# Patient Record
Sex: Female | Born: 1945 | Race: White | Hispanic: No | Smoking: Never smoker
Health system: Southern US, Community
[De-identification: ages and names within clinical notes are randomized; demographics above are authoritative.]

## PROBLEM LIST (undated history)

## (undated) DIAGNOSIS — L509 Urticaria, unspecified: Secondary | ICD-10-CM

## (undated) DIAGNOSIS — L80 Vitiligo: Secondary | ICD-10-CM

## (undated) DIAGNOSIS — T7840XA Allergy, unspecified, initial encounter: Secondary | ICD-10-CM

## (undated) DIAGNOSIS — E079 Disorder of thyroid, unspecified: Secondary | ICD-10-CM

## (undated) HISTORY — DX: Disorder of thyroid, unspecified: E07.9

## (undated) HISTORY — DX: Allergy, unspecified, initial encounter: T78.40XA

## (undated) HISTORY — DX: Urticaria, unspecified: L50.9

## (undated) HISTORY — PX: CATARACT EXTRACTION, BILATERAL: SHX1313

## (undated) HISTORY — DX: Vitiligo: L80

## (undated) HISTORY — PX: SINOSCOPY: SHX187

## (undated) HISTORY — PX: CHOLECYSTECTOMY: SHX55

## (undated) HISTORY — PX: OTHER SURGICAL HISTORY: SHX169

## (undated) HISTORY — PX: APPENDECTOMY: SHX54

---

## 1998-05-19 ENCOUNTER — Other Ambulatory Visit: Admission: RE | Admit: 1998-05-19 | Discharge: 1998-05-19 | Payer: Self-pay | Admitting: *Deleted

## 1999-07-19 ENCOUNTER — Other Ambulatory Visit: Admission: RE | Admit: 1999-07-19 | Discharge: 1999-07-19 | Payer: Self-pay | Admitting: Obstetrics and Gynecology

## 1999-09-08 ENCOUNTER — Encounter: Payer: Self-pay | Admitting: Obstetrics and Gynecology

## 1999-09-08 ENCOUNTER — Encounter: Admission: RE | Admit: 1999-09-08 | Discharge: 1999-09-08 | Payer: Self-pay | Admitting: Obstetrics and Gynecology

## 2000-12-25 ENCOUNTER — Other Ambulatory Visit: Admission: RE | Admit: 2000-12-25 | Discharge: 2000-12-25 | Payer: Self-pay | Admitting: Obstetrics and Gynecology

## 2001-01-10 ENCOUNTER — Encounter: Payer: Self-pay | Admitting: Internal Medicine

## 2001-01-10 ENCOUNTER — Encounter: Admission: RE | Admit: 2001-01-10 | Discharge: 2001-01-10 | Payer: Self-pay | Admitting: Internal Medicine

## 2001-01-21 ENCOUNTER — Encounter (INDEPENDENT_AMBULATORY_CARE_PROVIDER_SITE_OTHER): Payer: Self-pay | Admitting: Specialist

## 2001-01-21 ENCOUNTER — Ambulatory Visit (HOSPITAL_COMMUNITY): Admission: RE | Admit: 2001-01-21 | Discharge: 2001-01-21 | Payer: Self-pay | Admitting: Obstetrics and Gynecology

## 2001-01-21 ENCOUNTER — Encounter: Payer: Self-pay | Admitting: Anesthesiology

## 2002-03-06 ENCOUNTER — Encounter: Payer: Self-pay | Admitting: Family Medicine

## 2002-03-06 ENCOUNTER — Encounter: Admission: RE | Admit: 2002-03-06 | Discharge: 2002-03-06 | Payer: Self-pay | Admitting: Family Medicine

## 2002-03-30 ENCOUNTER — Other Ambulatory Visit: Admission: RE | Admit: 2002-03-30 | Discharge: 2002-03-30 | Payer: Self-pay | Admitting: Family Medicine

## 2003-06-24 ENCOUNTER — Encounter: Admission: RE | Admit: 2003-06-24 | Discharge: 2003-06-24 | Payer: Self-pay | Admitting: Internal Medicine

## 2005-02-06 ENCOUNTER — Encounter: Admission: RE | Admit: 2005-02-06 | Discharge: 2005-02-06 | Payer: Self-pay | Admitting: Family Medicine

## 2005-03-16 ENCOUNTER — Other Ambulatory Visit: Admission: RE | Admit: 2005-03-16 | Discharge: 2005-03-16 | Payer: Self-pay | Admitting: Family Medicine

## 2005-08-31 LAB — HM COLONOSCOPY

## 2006-02-08 ENCOUNTER — Ambulatory Visit: Payer: Self-pay | Admitting: Family Medicine

## 2006-03-12 ENCOUNTER — Encounter: Admission: RE | Admit: 2006-03-12 | Discharge: 2006-03-12 | Payer: Self-pay | Admitting: Family Medicine

## 2006-04-19 ENCOUNTER — Ambulatory Visit: Payer: Self-pay | Admitting: Family Medicine

## 2006-12-30 ENCOUNTER — Ambulatory Visit: Payer: Self-pay | Admitting: Family Medicine

## 2007-06-30 ENCOUNTER — Ambulatory Visit: Payer: Self-pay | Admitting: Family Medicine

## 2007-07-19 ENCOUNTER — Emergency Department (HOSPITAL_COMMUNITY): Admission: EM | Admit: 2007-07-19 | Discharge: 2007-07-19 | Payer: Self-pay | Admitting: Emergency Medicine

## 2008-04-23 ENCOUNTER — Encounter: Admission: RE | Admit: 2008-04-23 | Discharge: 2008-04-23 | Payer: Self-pay | Admitting: Family Medicine

## 2008-04-23 LAB — HM DEXA SCAN

## 2008-08-16 ENCOUNTER — Emergency Department (HOSPITAL_COMMUNITY): Admission: EM | Admit: 2008-08-16 | Discharge: 2008-08-16 | Payer: Self-pay | Admitting: Emergency Medicine

## 2008-08-16 ENCOUNTER — Emergency Department (HOSPITAL_COMMUNITY): Admission: EM | Admit: 2008-08-16 | Discharge: 2008-08-17 | Payer: Self-pay | Admitting: Emergency Medicine

## 2008-11-05 ENCOUNTER — Encounter: Payer: Self-pay | Admitting: Family Medicine

## 2008-11-05 ENCOUNTER — Ambulatory Visit: Payer: Self-pay | Admitting: Family Medicine

## 2008-11-05 ENCOUNTER — Other Ambulatory Visit: Admission: RE | Admit: 2008-11-05 | Discharge: 2008-11-05 | Payer: Self-pay | Admitting: Family Medicine

## 2009-07-29 ENCOUNTER — Encounter: Admission: RE | Admit: 2009-07-29 | Discharge: 2009-07-29 | Payer: Self-pay | Admitting: Family Medicine

## 2010-01-27 ENCOUNTER — Ambulatory Visit: Payer: Self-pay | Admitting: Family Medicine

## 2010-07-31 ENCOUNTER — Encounter
Admission: RE | Admit: 2010-07-31 | Discharge: 2010-07-31 | Payer: Self-pay | Source: Home / Self Care | Attending: Family Medicine | Admitting: Family Medicine

## 2010-12-01 NOTE — Op Note (Signed)
[facility]  Patient:    [name], [name]                      MRN: [id number] Proc. Date: 01/21/01 Adm. Date:  [id number] Attending:  Amanda Cockayne                           Operative Report  PREOPERATIVE DIAGNOSIS:       Irregular and heavy bleeding, fibroid uterus, perimenopausal, endometrial polyp.  POSTOPERATIVE DIAGNOSIS:  OPERATION:                    Hysteroscopy, resection of fibroid and polyp and dilatation.  SURGEON:                      Esmeralda Arthur, M.D.  ASSISTANT:  ANESTHESIA:                   General.  ESTIMATED BLOOD LOSS:         30 cc.  MEDIUM:                       Sorbitol, deficit 80 cc.  FINDINGS:                     Polyps, fibroid, and uterus sounded to 8 cm.  DESCRIPTION OF PROCEDURE:     The patient was carried to the operating room and after satisfactory general anesthesia, she was placed in the lithotomy position.  She was prepped and draped in a sterile field.  The bladder was emptied by catheterization.  The cervix was then dilated to a #25 and observation scope was placed in.  We really could not see very well.  She had no large polyp.  We then dilated her to #35 and inserted the resectoscope and as we could see much better, she had a small polyp which was resected and she had a couple of small fibroids which were resected.  The endometrial lining did not look as thick as it had on ultrasound.  We cauterized all bleeders and then slowly decreased the pressure and we then withdrew the scope and she had no excess bleeding.  We watched her for two minutes.  The procedure was terminated and she was carried to the recovery room in good condition. DD:  01/21/01 TD:  01/21/01 Job: [id number]

## 2010-12-01 NOTE — H&P (Signed)
[facility]  PatientQUIERRA, [name]                        MRN: [id number] Attending:  Esmeralda Arthur, M.D.                         History and Physical                                This is a 65 year old female para 3-0-3 who is admitted to the hospital for [facility], hysteroscopy, and resection of endometrial polyp.  Patient states her last menstrual period was approximately five months ago and she thought she was done.  She had a period June 28 which was very heavy and she had some irregular bleeding also on ______.  Patient had an examination which was basically felt the uterus to be a little enlarged, thought she might have fibroids.  Her ultrasound on January 08, 2001 showed the uterus 8 x 2 x 5.5 x 6.8 cm with an endometrial stripe of 19 mm with a fibroid on the right.  The fibroid measured 3.9 x 3.5 x 3.8.  Does not seem to encroach on the endometrial cavity.  The right ovary is not seen and left ovary is not seen.  She had a sonohysterogram, injected 20 cc of saline in the uterine cavity.  She had a thick anterior wall 5.1 mm and a thick posterior wall 9.3 with a polyp which measured 9 x 6 mm.  The patient is admitted for hysteroscopy and resection.  REVIEW OF SYSTEMS:            GENERAL:  No problem with weight loss or weight gain.  HEENT:  Eyes are negative.  She has some sinus problems for which she takes medication.  CARDIOVASCULAR:  Negative.  RESPIRATORY:  Negative. GASTROINTESTINAL:  Negative.  GENITOURINARY:  Negative.  MUSCULOSKELETAL: Negative.  ENDOCRINE:  She has a thyroid problem and she takes Levoxyl 100 mcg.  She also takes Actifed p.r.n.  PAST MEDICAL HISTORY:         Pneumonia, high blood pressure not currently treated, depression.  FAMILY HISTORY:               No history of diabetes, stroke, high blood pressure.  No history of breast, colon, or ovarian cancer.  History of osteoporosis in the family.  She has had a bone  density in June 2002 which showed a bone mineral ______ osteopenia.  PHYSICAL EXAMINATION  GENERAL:                      Well-developed, well-nourished female oriented and alert.  VITAL SIGNS:                  Blood pressure 113/71, weight 144.75 pounds, pulse 71, height 5 feet 2.75 inches.  HEART:                        Normal size, rhythm.  LUNGS:                        Clear to percussion and auscultation.  BREASTS:                      Tight, full without masses.  ABDOMEN:                      Liver is not enlarged.  Spleen is not enlarged.  PELVIC:                       Vagina:  She has estrogen effect.  Cervix is epithelialized.  Uterus is enlarged, thought to be fibroids.  Ovaries: Negative.  Adnexa:  Negative.  Perineum is within normal limits.  RECTAL:                       Confirms.  IMPRESSION:                   Perimenopausal, heavy and irregular periods, fibroid uterus, endometrial polyp.  Mother has osteoporosis.  Admit for surgery.  The surgery was discussed with the patient and husband and bleeding, clotting, possible perforation.  She understands the risks. DD:  01/20/01 TD:  01/20/01 Job: [id number]

## 2011-02-10 ENCOUNTER — Other Ambulatory Visit: Payer: Self-pay | Admitting: Family Medicine

## 2011-02-12 NOTE — Telephone Encounter (Signed)
Set her up for a medication check appointment. It's been a year. The lateral however thyroid medicine

## 2011-02-12 NOTE — Telephone Encounter (Signed)
Is this ok?

## 2011-03-23 ENCOUNTER — Other Ambulatory Visit: Payer: Self-pay | Admitting: Family Medicine

## 2011-04-12 ENCOUNTER — Encounter: Payer: Self-pay | Admitting: Family Medicine

## 2011-04-24 ENCOUNTER — Telehealth: Payer: Self-pay | Admitting: Family Medicine

## 2011-04-24 MED ORDER — LEVOTHYROXINE SODIUM 100 MCG PO TABS: 100.0000 ug | ORAL_TABLET | Freq: Every day | ORAL | Status: DC

## 2011-04-24 NOTE — Telephone Encounter (Signed)
Renew her thyroid medicine but check on when her last level was drawn and if it's been around a year have her come in

## 2011-04-24 NOTE — Telephone Encounter (Signed)
Sent med in no more refills till med check

## 2011-05-26 ENCOUNTER — Other Ambulatory Visit: Payer: Self-pay | Admitting: Family Medicine

## 2011-05-28 NOTE — Telephone Encounter (Signed)
Pt is coming in for thyoid check for to receive meds for thyroid. Will refill then

## 2011-05-29 ENCOUNTER — Encounter: Payer: Self-pay | Admitting: Family Medicine

## 2011-05-29 ENCOUNTER — Ambulatory Visit (INDEPENDENT_AMBULATORY_CARE_PROVIDER_SITE_OTHER): Payer: BC Managed Care – PPO | Admitting: Family Medicine

## 2011-05-29 VITALS — BP 140/88 | HR 76 | Wt 138.0 lb

## 2011-05-29 DIAGNOSIS — M8580 Other specified disorders of bone density and structure, unspecified site: Secondary | ICD-10-CM | POA: Insufficient documentation

## 2011-05-29 DIAGNOSIS — Z6379 Other stressful life events affecting family and household: Secondary | ICD-10-CM

## 2011-05-29 DIAGNOSIS — E039 Hypothyroidism, unspecified: Secondary | ICD-10-CM | POA: Insufficient documentation

## 2011-05-29 DIAGNOSIS — Z638 Other specified problems related to primary support group: Secondary | ICD-10-CM

## 2011-05-29 DIAGNOSIS — J301 Allergic rhinitis due to pollen: Secondary | ICD-10-CM | POA: Insufficient documentation

## 2011-05-29 DIAGNOSIS — Z23 Encounter for immunization: Secondary | ICD-10-CM

## 2011-05-29 DIAGNOSIS — Z79899 Other long term (current) drug therapy: Secondary | ICD-10-CM

## 2011-05-29 DIAGNOSIS — M899 Disorder of bone, unspecified: Secondary | ICD-10-CM

## 2011-05-29 LAB — COMPREHENSIVE METABOLIC PANEL
ALT: 13 U/L (ref 0–35)
AST: 22 U/L (ref 0–37)
Albumin: 4.5 g/dL (ref 3.5–5.2)
BUN: 16 mg/dL (ref 6–23)
CO2: 30 mEq/L (ref 19–32)
Chloride: 102 mEq/L (ref 96–112)
Potassium: 4.1 mEq/L (ref 3.5–5.3)
Sodium: 138 mEq/L (ref 135–145)
Total Bilirubin: 0.5 mg/dL (ref 0.3–1.2)

## 2011-05-29 LAB — CBC WITH DIFFERENTIAL/PLATELET
Basophils Relative: 1 % (ref 0–1)
Eosinophils Absolute: 0.5 10*3/uL (ref 0.0–0.7)
Hemoglobin: 13.7 g/dL (ref 12.0–15.0)
Lymphs Abs: 1.6 10*3/uL (ref 0.7–4.0)
MCH: 30.9 pg (ref 26.0–34.0)
Neutro Abs: 3.5 10*3/uL (ref 1.7–7.7)
RBC: 4.44 MIL/uL (ref 3.87–5.11)
RDW: 13 % (ref 11.5–15.5)
WBC: 6.2 10*3/uL (ref 4.0–10.5)

## 2011-05-29 LAB — LIPID PANEL
Total CHOL/HDL Ratio: 3 Ratio
VLDL: 19 mg/dL (ref 0–40)

## 2011-05-29 NOTE — Patient Instructions (Signed)
If you have difficulty with functioning or sleep, give me a call

## 2011-05-29 NOTE — Progress Notes (Signed)
  Subjective:    Patient ID: [name], female    DOB: 1945/07/17, 65 y.o.   MRN: [id number]  HPI She is here for medication check. Her main concern today is the fact that her husband had a CVA and has been in a nursing home since May. She is visiting him twice per day. She is quite stressed over this and is having some difficulties with sleep. Seems somewhat overwhelmed by will this to she did retire in July. She would like refills on her thyroid medication.   Review of Systems     Objective:   Physical Exam alert and in no distress. Tympanic membranes and canals are normal. Throat is clear. Tonsils are normal. Neck is supple without adenopathy or thyromegaly. Cardiac exam shows a regular sinus rhythm without murmurs or gallops. Lungs are clear to auscultation. DTRs are 1+.        Assessment & Plan:   1. Hypothyroid  TSH  2. Allergic rhinitis due to pollen    3. Osteopenia  TSH, CBC with Differential, Comprehensive metabolic panel  4. Encounter for long-term (current) use of other medications  TSH, CBC with Differential, Comprehensive metabolic panel, Lipid panel  5. Stressful life event affecting family     louderthen laid over er stressful life situation. Discussed using sleeping meds and anxiety meds. At this point she is not interested in them.

## 2011-05-30 ENCOUNTER — Ambulatory Visit: Payer: Self-pay | Admitting: Family Medicine

## 2011-05-30 MED ORDER — LEVOTHYROXINE SODIUM 100 MCG PO TABS: 100.0000 ug | ORAL_TABLET | Freq: Every day | ORAL | Status: DC

## 2011-05-30 NOTE — Progress Notes (Addendum)
  Subjective:    Patient ID: [name], female    DOB: 06-Oct-1945, 65 y.o.   MRN: [id number]  HPI    Review of Systems     Objective:   Physical Exam        Assessment & Plan:  TSH is normal. I will continue her on her present thyroid dosing.

## 2011-05-30 NOTE — Progress Notes (Signed)
Addended by: Ronnald Nian on: 05/30/2011 08:14 AM   Modules accepted: Orders

## 2011-08-17 ENCOUNTER — Encounter: Payer: Self-pay | Admitting: Medical

## 2011-08-17 ENCOUNTER — Ambulatory Visit (INDEPENDENT_AMBULATORY_CARE_PROVIDER_SITE_OTHER): Payer: BC Managed Care – PPO | Admitting: Medical

## 2011-08-17 VITALS — BP 110/80 | HR 60 | Temp 97.8°F | Resp 16 | Wt 141.0 lb

## 2011-08-17 DIAGNOSIS — J329 Chronic sinusitis, unspecified: Secondary | ICD-10-CM | POA: Insufficient documentation

## 2011-08-17 DIAGNOSIS — J40 Bronchitis, not specified as acute or chronic: Secondary | ICD-10-CM | POA: Insufficient documentation

## 2011-08-17 MED ORDER — PREDNISONE 20 MG PO TABS: ORAL_TABLET | ORAL | Status: DC

## 2011-08-17 MED ORDER — BENZONATATE 100 MG PO CAPS: 100.0000 mg | ORAL_CAPSULE | Freq: Three times a day (TID) | ORAL | Status: AC | PRN

## 2011-08-17 MED ORDER — LEVOFLOXACIN 500 MG PO TABS: 500.0000 mg | ORAL_TABLET | Freq: Every day | ORAL | Status: AC

## 2011-08-17 NOTE — Patient Instructions (Signed)
I am treating you for a respiratory infection involving head and chest.  Begin Levaquin antibiotic, once daily for a week.  Use the Tessalon Perles cough drops 3 times daily as needed.  You can also take Mucinex DM or Coricidin HBP OTC for congestion/cough.  Rest, drink plenty of fluids.    If you feel chest tightness or wheezy, then begin the Prednisone steroid, 3 tables daily for 2 days, then 2 tablets daily for 3 days.

## 2011-08-17 NOTE — Progress Notes (Signed)
Subjective:  [name] is a 66 y.o. female who presents for 1 week history of runny nose, sore throat, nasal and head congestion, intermittent headache, but 3 days ago cough, chest congestion, overall feeling worse. Appetite is down, she has had some chills and rattly chest. She is visiting her husband twice a day in the nursing home, so there may be sick contacts there. Her lungs difficult fight, has a history of bronchitis in the past.  She did get a flu shot this year.  No other aggravating or relieving factors.  No other c/o.  The following portions of the patient's history were reviewed and updated as appropriate: allergies, current medications, past family history, past medical history, past social history, past surgical history and problem list.  Past Medical History  Diagnosis Date  . Thyroid disease     HYPOTHYROID  . Allergy     RHINITIS  . Osteoporosis     OSTEOPENIA    Objective:   Filed Vitals:   08/17/11 1430  BP: 110/80  Pulse: 60  Temp: 97.8 F (36.6 C)  Resp: 16    General appearance: Alert, WD/WN, no distress, ill appearing, nasal sounding voice                             Skin: warm, no rash, no diaphoresis                           Head: mild sinus tenderness                            Eyes: conjunctiva normal, corneas clear, PERRLA                            Ears: pearly TMs, external ear canals normal                          Nose: septum midline, turbinates swollen, with erythema and clear discharge             Mouth/throat: MMM, tongue normal, mild pharyngeal erythema                           Neck: supple, no adenopathy, no thyromegaly, nontender                          Heart: RRR, normal S1, S2, no murmurs                         Lungs: +bronchial breath sounds, +scattered rhonchi, no wheezes, no rales                Extremities: no edema, nontender     Assessment and Plan:   Encounter Diagnosis  Name Primary?  . Sinobronchitis Yes   Given  her symptoms, exam, and nursing home visitation recently, will cover for suspected bacteria.  Prescription given today for Levaquin, Prednisone and Tessalon Perles as below.  Discussed diagnosis and treatment.  Suggested symptomatic OTC remedies for cough and congestion.   Tylenol or Ibuprofen OTC for fever and malaise.  Call/return in 2-3 days if symptoms are worse or not improving.  Advised that cough may linger even after the infection  is improved.

## 2011-08-21 ENCOUNTER — Telehealth: Payer: Self-pay | Admitting: Family Medicine

## 2011-08-21 NOTE — Telephone Encounter (Signed)
PATIENT STATES THAT SHE IS AFRAID OF THE SIDE EFFECTS OF THE MEDICATION SO SHE DECIDED THAT SHE WILL JUST WAIT A FEW DAYS AND SEE IF THINGS GET BETTER. I ADVISED HER THAT SHE WILL NEED TO LET us KNOW SOMETHING ON Friday AS TO WITHER SHE IS FEELING BETTER OR NOT. PATIENT AGREED AND WILL LET us KNOW. CLS

## 2011-08-21 NOTE — Telephone Encounter (Signed)
Its hard to know if this is from the antibiotic versus the prednisone?  Either way, she can stop the Levaquin.   If the head and chest congestion is not improving, we can send out different antibiotic such as Augmentin.  Let me know.

## 2011-09-04 ENCOUNTER — Other Ambulatory Visit: Payer: Self-pay | Admitting: Family Medicine

## 2011-09-04 DIAGNOSIS — Z1231 Encounter for screening mammogram for malignant neoplasm of breast: Secondary | ICD-10-CM

## 2011-09-06 ENCOUNTER — Ambulatory Visit (INDEPENDENT_AMBULATORY_CARE_PROVIDER_SITE_OTHER): Payer: BC Managed Care – PPO | Admitting: Family Medicine

## 2011-09-06 ENCOUNTER — Encounter: Payer: Self-pay | Admitting: Internal Medicine

## 2011-09-06 ENCOUNTER — Encounter: Payer: Self-pay | Admitting: Family Medicine

## 2011-09-06 VITALS — BP 114/70 | HR 74 | Ht 61.5 in | Wt 139.0 lb

## 2011-09-06 DIAGNOSIS — J209 Acute bronchitis, unspecified: Secondary | ICD-10-CM

## 2011-09-06 DIAGNOSIS — J019 Acute sinusitis, unspecified: Secondary | ICD-10-CM

## 2011-09-06 MED ORDER — AMOXICILLIN 875 MG PO TABS: 875.0000 mg | ORAL_TABLET | Freq: Two times a day (BID) | ORAL | Status: AC

## 2011-09-06 NOTE — Progress Notes (Signed)
  Subjective:    Patient ID: [name], female    DOB: Feb 25, 1946, 66 y.o.   MRN: [id number]  HPI Since last being seen she has continued to have some difficulty with malaise and decreased fatigue. Over the last 4 days she is also noted left earache as well as increasing fatigue but no trouble with  sore throat but she has had increased difficulty with congestion. She did have trouble with  The Levaquin and had to stop it due to adverse side effects.   Review of Systems     Objective:   Physical Exam alert and in no distress. Tympanic membranes and canals are normal. Throat is clear. Tonsils are normal. Neck is supple without adenopathy or thyromegaly. Cardiac exam shows a regular sinus rhythm without murmurs or gallops. Lungs are clear to auscultation.        Assessment & Plan:   1. Bronchitis, acute  amoxicillin (AMOXIL) 875 MG tablet  2. Sinusitis, acute  amoxicillin (AMOXIL) 875 MG tablet   I think she had unresolved sinusitis bronchitis from the previous episode and now has recurred.

## 2011-09-06 NOTE — Patient Instructions (Signed)
Take all the antibiotic and have trouble let me know. If you're not totally back to normal when you get done give me a call

## 2011-09-17 ENCOUNTER — Ambulatory Visit
Admission: RE | Admit: 2011-09-17 | Discharge: 2011-09-17 | Disposition: A | Discharge status: Home / Self Care | Payer: BC Managed Care – PPO | Source: Ambulatory Visit | Attending: Family Medicine | Admitting: Family Medicine

## 2011-09-17 DIAGNOSIS — Z1231 Encounter for screening mammogram for malignant neoplasm of breast: Secondary | ICD-10-CM

## 2011-11-28 ENCOUNTER — Telehealth: Payer: Self-pay | Admitting: Internal Medicine

## 2011-11-28 MED ORDER — LEVOTHYROXINE SODIUM 100 MCG PO TABS: 100.0000 ug | ORAL_TABLET | Freq: Every day | ORAL | Status: DC

## 2011-11-28 NOTE — Telephone Encounter (Signed)
Unithroid/levoxyl is unavailable so i switched her to synthroid #30 11 refills

## 2012-01-22 ENCOUNTER — Ambulatory Visit (INDEPENDENT_AMBULATORY_CARE_PROVIDER_SITE_OTHER): Payer: BC Managed Care – PPO | Admitting: Family Medicine

## 2012-01-22 ENCOUNTER — Encounter: Payer: Self-pay | Admitting: Family Medicine

## 2012-01-22 VITALS — BP 118/76 | HR 66 | Wt 141.0 lb

## 2012-01-22 DIAGNOSIS — M461 Sacroiliitis, not elsewhere classified: Secondary | ICD-10-CM

## 2012-01-22 NOTE — Progress Notes (Signed)
  Subjective:    Patient ID: [name], female    DOB: [birth year], 66 y.o.   MRN: [id number]  HPI She has a history of intermittent back pain for approximately one year. It would goal a relatively quickly. Approximately 5 days ago she developed left-sided back pain that radiated down her leg with tingling in her toes. No numbness, tingling or weakness.  Review of Systems     Objective:   Physical Exam Alert and in no distress. Tender to palpation of the left SI joint. Figure 4 and stork test was positive. Negative straight leg raising. Normal DTRs.       Assessment & Plan:   1. Sacroiliitis    instructed her on heat, stretching and a lead. She will call if not entirely better after one or 2 weeks.

## 2012-01-22 NOTE — Patient Instructions (Addendum)
Heat to your back for 20 minutes 3 times per day. When you've done with the heat then I want you to do some stretching. Take 2 Aleve twice a day. Made any improvements in 2 weeks call me and I will refer you to physical therapy

## 2012-01-23 ENCOUNTER — Other Ambulatory Visit: Payer: Self-pay | Admitting: Family Medicine

## 2012-01-23 MED ORDER — TRAMADOL HCL 50 MG PO TABS: ORAL_TABLET | ORAL | Status: DC

## 2012-01-23 NOTE — Telephone Encounter (Signed)
Pt was called and informed of med ordered. Pt states she is not comfortable with that type meds and requested an appt. Pt coming in tomorrow.

## 2012-01-23 NOTE — Telephone Encounter (Signed)
Pt called back and cancelled her appt for tomorrow morning 01/24/12 and stated she will try out the tramadol first

## 2012-01-23 NOTE — Telephone Encounter (Signed)
Pt called and cancelled appointment and said she was going to try medication instead.

## 2012-01-24 ENCOUNTER — Ambulatory Visit: Payer: BC Managed Care – PPO | Admitting: Family Medicine

## 2012-05-12 ENCOUNTER — Ambulatory Visit: Payer: BC Managed Care – PPO | Admitting: Family Medicine

## 2012-05-12 ENCOUNTER — Ambulatory Visit (INDEPENDENT_AMBULATORY_CARE_PROVIDER_SITE_OTHER): Payer: BC Managed Care – PPO | Admitting: Family Medicine

## 2012-05-12 ENCOUNTER — Encounter: Payer: Self-pay | Admitting: Family Medicine

## 2012-05-12 VITALS — BP 112/70 | HR 66

## 2012-05-12 DIAGNOSIS — Z23 Encounter for immunization: Secondary | ICD-10-CM

## 2012-05-12 DIAGNOSIS — F419 Anxiety disorder, unspecified: Secondary | ICD-10-CM

## 2012-05-12 DIAGNOSIS — F341 Dysthymic disorder: Secondary | ICD-10-CM

## 2012-05-12 MED ORDER — CITALOPRAM HYDROBROMIDE 20 MG PO TABS: 20.0000 mg | ORAL_TABLET | Freq: Every day | ORAL | Status: DC

## 2012-05-12 NOTE — Patient Instructions (Signed)
Check at the nursing home and also with the [employer] and hospice for support groups. Do not go out to the nursing home every day

## 2012-05-12 NOTE — Progress Notes (Signed)
  Subjective:    Patient ID: [name], female    DOB: [birth year], 66 y.o.   MRN: [id number]  HPI She is here for consult concerning anxiety and depression. For the last 2 years her husband has been in a nursing home. She has been visiting him twice per day. 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 She is quite anxious over this and having some legal difficulties with the [facility] and through his Medicaid insurance. She has had difficulty with sleep. Several people said it's time for her to get on medications.   Review of Systems     Objective:   Physical Exam Alert and cheerful but appropriately dressed with appropriate affect.       Assessment & Plan:   1. Need for prophylactic vaccination and inoculation against influenza  Flu vaccine greater than or equal to 3yo preservative free IM  2. Anxiety and depression  citalopram (CELEXA) 20 MG tablet   I discussed the need for her to take some time out for herself. I explained that he is now a point where his Alzheimer's disease is interfering with his ability to recommend whether she even is visiting. She is aware of this and I encouraged her to visit every other day. I will also place him on Celexa. Recommend she get involved in a support group. Check here in one month Flu shot given with risks and benefits discussed

## 2012-06-16 ENCOUNTER — Ambulatory Visit: Payer: BC Managed Care – PPO | Admitting: Family Medicine

## 2012-10-23 ENCOUNTER — Emergency Department (HOSPITAL_COMMUNITY)
Admission: EM | Admit: 2012-10-23 | Discharge: 2012-10-23 | Disposition: A | Discharge status: Home / Self Care | Payer: Medicare Other | Source: Home / Self Care | Attending: Emergency Medicine | Admitting: Emergency Medicine

## 2012-10-23 ENCOUNTER — Encounter (HOSPITAL_COMMUNITY): Payer: Self-pay | Admitting: Emergency Medicine

## 2012-10-23 ENCOUNTER — Emergency Department (INDEPENDENT_AMBULATORY_CARE_PROVIDER_SITE_OTHER): Payer: Medicare Other

## 2012-10-23 DIAGNOSIS — S5290XA Unspecified fracture of unspecified forearm, initial encounter for closed fracture: Secondary | ICD-10-CM

## 2012-10-23 DIAGNOSIS — S5291XA Unspecified fracture of right forearm, initial encounter for closed fracture: Secondary | ICD-10-CM

## 2012-10-23 MED ORDER — HYDROCODONE-ACETAMINOPHEN 5-325 MG PO TABS: ORAL_TABLET | ORAL | Status: DC

## 2012-10-23 MED ORDER — HYDROCODONE-ACETAMINOPHEN 5-325 MG PO TABS
ORAL_TABLET | ORAL | Status: AC
  Filled 2012-10-23: qty 1

## 2012-10-23 MED ORDER — HYDROCODONE-ACETAMINOPHEN 5-325 MG PO TABS
1.0000 | ORAL_TABLET | Freq: Once | ORAL | Status: AC
  Administered 2012-10-23: 1 via ORAL

## 2012-10-23 NOTE — Progress Notes (Signed)
Orthopedic Tech Progress Note Patient Details:  [name] 05/11/1946 [id number]  Ortho Devices Type of Ortho Device: Ace wrap;Arm sling;Sugartong splint Ortho Device/Splint Location: (R) UE Ortho Device/Splint Interventions: Ordered;Application   Jennye Moccasin 10/23/2012, 8:27 PM

## 2012-10-23 NOTE — ED Notes (Signed)
Patient states she fell in yard while raking leaves. States numbness and inability to move right hand with pain that radiates to elbow.

## 2012-10-23 NOTE — ED Provider Notes (Signed)
Chief Complaint:   Chief Complaint  Patient presents with  . Wrist Pain    History of Present Illness:   [name] is a 67 year old female who fell this afternoon in her yard on outstretched hand, injuring her right wrist. The wrist shows obvious deformity and it hurts to move it. The pain radiates up towards the elbow and the entire hand feels somewhat numb and tingly.  Review of Systems:  Other than noted above, the patient denies any of the following symptoms: Systemic:  No fevers, chills, sweats, or aches.  No fatigue or tiredness. Musculoskeletal:  No joint pain, arthritis, bursitis, swelling, back pain, or neck pain. Neurological:  No muscular weakness, paresthesias, headache, or trouble with speech or coordination.  No dizziness.  PMFSH:  Past medical history, family history, social history, meds, and allergies were reviewed.    Physical Exam:   Vital signs:  BP 168/70  Pulse 80  Temp(Src) 98.4 F (36.9 C) (Oral)  Resp 16  SpO2 99% Gen:  Alert and oriented times 3.  In no distress. Musculoskeletal: There is an obvious deformity at the wrist. She is unable to move the wrist at all on spray tender to touch. She has tenderness from the elbow on down to the hand. She does perceive light touch in all of her fingers and she has good capillary refill and good radial pulse.  Otherwise, all joints had a full a ROM with no swelling, bruising or deformity.  No edema, pulses full. Extremities were warm and pink.  Capillary refill was brisk.  Skin:  Clear, warm and dry.  No rash. Neuro:  Alert and oriented times 3.  Muscle strength was normal.  Sensation was intact to light touch.   Radiology:  Dg Wrist Complete Right  10/23/2012  *RADIOLOGY REPORT*  Clinical Data: Fall on outstretched hand  RIGHT WRIST - COMPLETE 3+ VIEW  Comparison: None.  Findings: Nondisplaced/impacted distal radial fracture with apex volar angulation.  Minimally displaced ulnar styloid fracture.  Moderate  degenerative changes the first carpometacarpal joint.  Associated soft tissue swelling.  IMPRESSION: Distal radial and ulnar fractures, as described above.   Original Report Authenticated By: Charline Bills, M.D.    I reviewed the images independently and personally and concur with the radiologist's findings.  Course in Urgent Care Center:   She was given Norco 5/325 for pain. A sugar tong splint was placed and she was put in a sling.  Assessment:  The encounter diagnosis was Radial fracture, right, closed, initial encounter.  Plan:   1.  The following meds were prescribed:   Discharge Medication List as of 10/23/2012  8:04 PM    START taking these medications   Details  HYDROcodone-acetaminophen (NORCO/VICODIN) 5-325 MG per tablet 1 to 2 tabs every 4 to 6 hours as needed for pain., Print       2.  The patient was instructed in symptomatic care, including rest and activity, elevation, application of ice and compression.  Appropriate handouts were given. 3.  The patient was told to return if becoming worse in any way, if no better in 3 or 4 days, and given some red flag symptoms such as worsening pain, increase in numbness, or color changes of the hand that would indicate earlier return.   4.  The patient was told to follow up with Dr. Amanda Pea at his office tomorrow. He was called and informed of her injury.    Reuben Likes, MD 10/23/12 2120

## 2012-11-24 ENCOUNTER — Other Ambulatory Visit: Payer: Self-pay | Admitting: Family Medicine

## 2013-01-09 ENCOUNTER — Other Ambulatory Visit: Payer: Self-pay

## 2013-01-09 DIAGNOSIS — Z1231 Encounter for screening mammogram for malignant neoplasm of breast: Secondary | ICD-10-CM

## 2013-01-30 ENCOUNTER — Ambulatory Visit: Payer: Self-pay | Admitting: Family Medicine

## 2013-01-30 ENCOUNTER — Ambulatory Visit: Payer: Medicare Other

## 2013-02-13 ENCOUNTER — Ambulatory Visit
Admission: RE | Admit: 2013-02-13 | Discharge: 2013-02-13 | Disposition: A | Discharge status: Home / Self Care | Payer: Medicare Other | Source: Ambulatory Visit

## 2013-02-13 DIAGNOSIS — Z1231 Encounter for screening mammogram for malignant neoplasm of breast: Secondary | ICD-10-CM

## 2013-04-03 ENCOUNTER — Emergency Department (HOSPITAL_COMMUNITY)
Admission: EM | Admit: 2013-04-03 | Discharge: 2013-04-04 | Disposition: A | Discharge status: Home / Self Care | Payer: Medicare Other | Attending: Emergency Medicine | Admitting: Emergency Medicine

## 2013-04-03 ENCOUNTER — Emergency Department (HOSPITAL_COMMUNITY): Payer: Medicare Other

## 2013-04-03 ENCOUNTER — Encounter (HOSPITAL_COMMUNITY): Payer: Self-pay | Admitting: Emergency Medicine

## 2013-04-03 DIAGNOSIS — S92502A Displaced unspecified fracture of left lesser toe(s), initial encounter for closed fracture: Secondary | ICD-10-CM

## 2013-04-03 DIAGNOSIS — Z79899 Other long term (current) drug therapy: Secondary | ICD-10-CM | POA: Insufficient documentation

## 2013-04-03 DIAGNOSIS — M810 Age-related osteoporosis without current pathological fracture: Secondary | ICD-10-CM | POA: Insufficient documentation

## 2013-04-03 DIAGNOSIS — S92919A Unspecified fracture of unspecified toe(s), initial encounter for closed fracture: Secondary | ICD-10-CM | POA: Insufficient documentation

## 2013-04-03 DIAGNOSIS — Z7982 Long term (current) use of aspirin: Secondary | ICD-10-CM | POA: Insufficient documentation

## 2013-04-03 DIAGNOSIS — Y9389 Activity, other specified: Secondary | ICD-10-CM | POA: Insufficient documentation

## 2013-04-03 DIAGNOSIS — Z8709 Personal history of other diseases of the respiratory system: Secondary | ICD-10-CM | POA: Insufficient documentation

## 2013-04-03 DIAGNOSIS — Y9289 Other specified places as the place of occurrence of the external cause: Secondary | ICD-10-CM | POA: Insufficient documentation

## 2013-04-03 DIAGNOSIS — W2209XA Striking against other stationary object, initial encounter: Secondary | ICD-10-CM | POA: Insufficient documentation

## 2013-04-03 DIAGNOSIS — E039 Hypothyroidism, unspecified: Secondary | ICD-10-CM | POA: Insufficient documentation

## 2013-04-03 NOTE — ED Notes (Signed)
Pt states she struck R foot on door frame @ 2030, 5th toe deformed and discolored.

## 2013-04-03 NOTE — ED Provider Notes (Signed)
CSN: [id number]     Arrival date & time 04/03/13  2209 History   This chart was scribed for non-physician practitioner Emilia Beck, PA-C working with Hurman Horn, MD by Joaquin Music, ED Scribe. This patient was seen in room WTR7/WTR7 and the patient's care was started at 11:19 PM  Chief Complaint  Patient presents with  . Toe Injury   The history is provided by the patient. No language interpreter was used.   HPI Comments: [name] is a 66 y.o. female with history of osteoporosis who presents to the Emergency Department complaining of severe left 5th toe pain onset 3 hours ago. She states she was in the kitchen when she struck and injured her 5th left toe. Pt states pain was sharp and sudden. Pt denies any other associated symptoms.     Past Medical History  Diagnosis Date  . Thyroid disease     HYPOTHYROID  . Allergy     RHINITIS  . Osteoporosis     OSTEOPENIA   Past Surgical History  Procedure Laterality Date  . Appendectomy    . Cholecystectomy    . Arm surgery Right    Family History  Problem Relation Age of Onset  . Stroke Mother   . Arthritis Mother    History  Substance Use Topics  . Smoking status: Never Smoker   . Smokeless tobacco: Not on file  . Alcohol Use: No   OB History   Grav Para Term Preterm Abortions TAB SAB Ect Mult Living                 Review of Systems  All other systems reviewed and are negative.    Allergies  Levofloxacin  Home Medications   Current Outpatient Rx  Name  Route  Sig  Dispense  Refill  . aspirin 81 MG tablet   Oral   Take 81 mg by mouth daily.           . citalopram (CELEXA) 20 MG tablet   Oral   Take 1 tablet (20 mg total) by mouth daily.   30 tablet   3   . HYDROcodone-acetaminophen (NORCO/VICODIN) 5-325 MG per tablet      1 to 2 tabs every 4 to 6 hours as needed for pain.   20 tablet   0   . Multiple Vitamins-Minerals (MULTIVITAMIN WITH MINERALS) tablet   Oral   Take 1  tablet by mouth daily.         Marland Kitchen UNITHROID 100 MCG tablet      TAKE 1 TABLET (100 MCG TOTAL) BY MOUTH DAILY.   30 tablet   11     Dispense as written.    Triage Vitals: BP 138/72  Pulse 72  Temp(Src) 98.1 F (36.7 C) (Oral)  Resp 18  Ht 5\' 1"  (1.549 m)  Wt 140 lb (63.504 kg)  BMI 26.47 kg/m2  SpO2 98%  Physical Exam  Nursing note and vitals reviewed. Constitutional: She is oriented to person, place, and time. She appears well-developed and well-nourished. No distress.  HENT:  Head: Normocephalic and atraumatic.  Eyes: EOM are normal.  Neck: Neck supple. No tracheal deviation present.  Cardiovascular: Normal rate.   Sufficient capillary refill of distal 5 toe.  Pulmonary/Chest: Effort normal. No respiratory distress.  Musculoskeletal: Normal range of motion.  Obvious deformity of left 5 toe with lateral angulation. Tenderness to palpate at the proximal joint with overlying bruising.   Neurological: She is alert and  oriented to person, place, and time.  Sensation intact of left 5 toe.  Skin: Skin is warm and dry.  No open wounds. Bruising noted to left 5 toe.   Psychiatric: She has a normal mood and affect. Her behavior is normal.    ED Course  NERVE BLOCK Date/Time: 04/04/2013 12:02 AM Performed by: Emilia Beck Authorized by: Emilia Beck Consent: Verbal consent obtained. Risks and benefits: risks, benefits and alternatives were discussed Consent given by: patient Patient understanding: patient states understanding of the procedure being performed Patient consent: the patient's understanding of the procedure matches consent given Patient identity confirmed: verbally with patient Indications: fracture Body area: lower extremity Nerve: digital Laterality: left Patient sedated: no Patient position: sitting Needle gauge: 25 G Location technique: anatomical landmarks Local anesthetic: lidocaine 2% without epinephrine Anesthetic total: 3 ml Outcome:  pain unchanged Patient tolerance: Patient tolerated the procedure well with no immediate complications.    DIAGNOSTIC STUDIES: Oxygen Saturation is 98% on RA, normal by my interpretation.    COORDINATION OF CARE: 11:21 PM-Discussed radiology findings of fx of left 5th toe. Will discuss pt case with attending provider, Dr. Fonnie Jarvis and ortho tech. Pt will need to F/U with orthopedist. Pt agrees with treatment.   Labs Review Labs Reviewed - No data to display Imaging Review Dg Toe 5th Left  04/03/2013   CLINICAL DATA:  Bruising and deformity after blunt trauma  EXAM: DG TOE 5TH LEFT  COMPARISON:  02/26/2008  FINDINGS: Oblique fracture across the proximal phalanx of the left little toe, with lateral angulation of the distal fracture fragment. No evidence of extension to the articular surface. No other acute bone abnormality. Mild diffuse osteopenia.  IMPRESSION: Angulated fracture, proximal phalanx left little toe.   Electronically Signed   By: Oley Balm M.D.   On: 04/03/2013 23:02    MDM   1. Fracture of fifth toe, left, closed, initial encounter     12:03 AM Patient given nerve block and attempted to realign toe fracture. Buddy tape applied to toe. Patient will follow up with Orthopedics who she has established care with. No neurovascular compromise. Vitals stable and patient afebrile. Patient instructed to minimal weight bearing. Patient has a post-op shoe with her that she is instructed to wear.   I personally performed the services described in this documentation, which was scribed in my presence. The recorded information has been reviewed and is accurate.    Emilia Beck, [place] 04/04/13 [phone]

## 2013-04-04 NOTE — ED Provider Notes (Signed)
Medical screening examination/treatment/procedure(s) were performed by non-physician practitioner and as supervising physician I was immediately available for consultation/collaboration.  Masao Junker M Zenna Traister, MD 04/04/13 0345 

## 2013-04-29 ENCOUNTER — Encounter: Payer: Self-pay | Admitting: Internal Medicine

## 2013-06-18 ENCOUNTER — Institutional Professional Consult (permissible substitution): Payer: Self-pay | Admitting: Family Medicine

## 2013-11-30 ENCOUNTER — Other Ambulatory Visit: Payer: Self-pay | Admitting: Family Medicine

## 2013-12-01 ENCOUNTER — Other Ambulatory Visit: Payer: Self-pay

## 2013-12-01 MED ORDER — UNITHROID 100 MCG PO TABS: ORAL_TABLET | ORAL | Status: DC

## 2013-12-15 ENCOUNTER — Encounter: Payer: Self-pay | Admitting: Family Medicine

## 2013-12-15 ENCOUNTER — Ambulatory Visit (INDEPENDENT_AMBULATORY_CARE_PROVIDER_SITE_OTHER): Payer: Medicare Other | Admitting: Family Medicine

## 2013-12-15 VITALS — BP 120/70 | HR 68 | Ht 61.0 in | Wt 141.0 lb

## 2013-12-15 DIAGNOSIS — Z23 Encounter for immunization: Secondary | ICD-10-CM

## 2013-12-15 DIAGNOSIS — M899 Disorder of bone, unspecified: Secondary | ICD-10-CM

## 2013-12-15 DIAGNOSIS — E039 Hypothyroidism, unspecified: Secondary | ICD-10-CM

## 2013-12-15 DIAGNOSIS — J301 Allergic rhinitis due to pollen: Secondary | ICD-10-CM

## 2013-12-15 DIAGNOSIS — M949 Disorder of cartilage, unspecified: Secondary | ICD-10-CM

## 2013-12-15 DIAGNOSIS — M8580 Other specified disorders of bone density and structure, unspecified site: Secondary | ICD-10-CM

## 2013-12-15 DIAGNOSIS — Z0000 Encounter for general adult medical examination without abnormal findings: Secondary | ICD-10-CM

## 2013-12-15 LAB — CBC WITH DIFFERENTIAL/PLATELET
BASOS ABS: 0.1 10*3/uL (ref 0.0–0.1)
BASOS PCT: 1 % (ref 0–1)
EOS PCT: 3 % (ref 0–5)
Eosinophils Absolute: 0.2 10*3/uL (ref 0.0–0.7)
HEMATOCRIT: 41.3 % (ref 36.0–46.0)
Hemoglobin: 14.1 g/dL (ref 12.0–15.0)
Lymphocytes Relative: 24 % (ref 12–46)
Lymphs Abs: 1.6 10*3/uL (ref 0.7–4.0)
MCH: 30.6 pg (ref 26.0–34.0)
MCHC: 34.1 g/dL (ref 30.0–36.0)
MCV: 89.6 fL (ref 78.0–100.0)
MONO ABS: 0.5 10*3/uL (ref 0.1–1.0)
Monocytes Relative: 8 % (ref 3–12)
NEUTROS ABS: 4.3 10*3/uL (ref 1.7–7.7)
Neutrophils Relative %: 64 % (ref 43–77)
PLATELETS: 235 10*3/uL (ref 150–400)
RBC: 4.61 MIL/uL (ref 3.87–5.11)
RDW: 13.9 % (ref 11.5–15.5)
WBC: 6.7 10*3/uL (ref 4.0–10.5)

## 2013-12-15 LAB — COMPREHENSIVE METABOLIC PANEL
ALK PHOS: 69 U/L (ref 39–117)
ALT: 18 U/L (ref 0–35)
AST: 26 U/L (ref 0–37)
Albumin: 4.4 g/dL (ref 3.5–5.2)
BILIRUBIN TOTAL: 0.6 mg/dL (ref 0.2–1.2)
BUN: 19 mg/dL (ref 6–23)
CO2: 26 mEq/L (ref 19–32)
CREATININE: 0.72 mg/dL (ref 0.50–1.10)
Calcium: 9.9 mg/dL (ref 8.4–10.5)
Chloride: 99 mEq/L (ref 96–112)
Glucose, Bld: 85 mg/dL (ref 70–99)
Potassium: 4.2 mEq/L (ref 3.5–5.3)
Sodium: 136 mEq/L (ref 135–145)
Total Protein: 7.2 g/dL (ref 6.0–8.3)

## 2013-12-15 NOTE — Progress Notes (Signed)
   Subjective:    Patient ID: [name], female    DOB: July 09, 1946, 68 y.o.   MRN: [id number]  HPI She is here for a complete examination. She does have hypothyroidism and presently is on replacement medication. She also has a history of osteopenia and has not had a DEXA scan done in quite some time. Her allergies are under good control. She is a semiretired. She is now dating someone and this is going quite well. She does keep herself quite busy physically as well as mentally. She has no other concerns or complaints. She does have an advanced directive.  Review of Systems  All other systems reviewed and are negative.      Objective:   Physical Exam BP 120/70  Pulse 68  Ht 5\' 1"  (1.549 m)  Wt 141 lb (63.957 kg)  BMI 26.66 kg/m2  General Appearance:    Alert, cooperative, no distress, appears stated age  Head:    Normocephalic, without obvious abnormality, atraumatic  Eyes:    PERRL, conjunctiva/corneas clear, EOM's intact, fundi    benign  Ears:    Normal TM's and external ear canals  Nose:   Nares normal, mucosa normal, no drainage or sinus   tenderness  Throat:   Lips, mucosa, and tongue normal; teeth and gums normal  Neck:   Supple, no lymphadenopathy;  thyroid:  no   enlargement/tenderness/nodules; no carotid   bruit or JVD  Back:    Spine nontender, no curvature, ROM normal, no CVA     tenderness  Lungs:     Clear to auscultation bilaterally without wheezes, rales or     ronchi; respirations unlabored  Chest Wall:    No tenderness or deformity   Heart:    Regular rate and rhythm, S1 and S2 normal, no murmur, rub   or gallop  Breast Exam:    Deferred to GYN  Abdomen:     Soft, non-tender, nondistended, normoactive bowel sounds,    no masses, no hepatosplenomegaly  Genitalia:    Deferred to GYN     Extremities:   No clubbing, cyanosis or edema  Pulses:   2+ and symmetric all extremities  Skin:   Skin color, texture, turgor normal, no rashes or lesions  Lymph nodes:    Cervical, supraclavicular, and axillary nodes normal  Neurologic:   CNII-XII intact, normal strength, sensation and gait; reflexes 2+ and symmetric throughout          Psych:   Normal mood, affect, hygiene and grooming.          Assessment & Plan:  Routine general medical examination at a health care facility - Plan: CBC with Differential, Comprehensive metabolic panel  Need for prophylactic vaccination against Streptococcus pneumoniae (pneumococcus) - Plan: Pneumococcal conjugate vaccine 13-valent  Hypothyroid - Plan: TSH  Allergic rhinitis due to pollen  Osteopenia - Plan: DG Bone Density  encouraged her to take good care of herself physically as well as mentally. Discussed nightlights which she is doing. Also discussed possible falls. Psychologically she is doing quite well. Followup pending blood work.

## 2013-12-16 ENCOUNTER — Telehealth: Payer: Self-pay | Admitting: Family Medicine

## 2013-12-16 LAB — TSH: TSH: 1.193 u[IU]/mL (ref 0.350–4.500)

## 2013-12-16 MED ORDER — LEVOTHYROXINE SODIUM 100 MCG PO TABS: ORAL_TABLET | ORAL | Status: DC

## 2013-12-16 NOTE — Telephone Encounter (Signed)
Returned called to pt [name] [phone] lm that I returned her call.  She had left message with concerns of her meds and avs

## 2013-12-16 NOTE — Addendum Note (Signed)
Addended by: Denita Lung on: 12/16/2013 08:27 AM   Modules accepted: Orders

## 2013-12-21 ENCOUNTER — Telehealth: Payer: Self-pay | Admitting: Family Medicine

## 2013-12-21 NOTE — Telephone Encounter (Signed)
Please call patient concerning  Bone density and  She also needs to update her medication list

## 2013-12-21 NOTE — Telephone Encounter (Signed)
Pt is scheduled for Bone Density on 12/25/13 at 2:00pm

## 2013-12-25 ENCOUNTER — Ambulatory Visit
Admission: RE | Admit: 2013-12-25 | Discharge: 2013-12-25 | Disposition: A | Discharge status: Home / Self Care | Payer: Medicare Other | Source: Ambulatory Visit | Attending: Family Medicine | Admitting: Family Medicine

## 2013-12-25 LAB — HM DEXA SCAN

## 2013-12-30 ENCOUNTER — Encounter: Payer: Self-pay | Admitting: Internal Medicine

## 2013-12-31 ENCOUNTER — Telehealth: Payer: Self-pay | Admitting: Family Medicine

## 2013-12-31 NOTE — Telephone Encounter (Signed)
Pt.notified

## 2013-12-31 NOTE — Telephone Encounter (Signed)
Let her know that the DEXA showed osteopenia which is thinning of the bones but not osteoporosis. Continue with vitamin D and calcium.

## 2013-12-31 NOTE — Telephone Encounter (Signed)
Pt called awaiting dexa results.  Please advise [phone].  Also sent copy of labs to pt as requested

## 2014-02-24 ENCOUNTER — Other Ambulatory Visit: Payer: Self-pay

## 2014-02-24 DIAGNOSIS — Z1231 Encounter for screening mammogram for malignant neoplasm of breast: Secondary | ICD-10-CM

## 2014-03-03 ENCOUNTER — Ambulatory Visit
Admission: RE | Admit: 2014-03-03 | Discharge: 2014-03-03 | Disposition: A | Discharge status: Home / Self Care | Payer: Medicare Other | Source: Ambulatory Visit

## 2014-03-03 DIAGNOSIS — Z1231 Encounter for screening mammogram for malignant neoplasm of breast: Secondary | ICD-10-CM

## 2014-05-12 ENCOUNTER — Other Ambulatory Visit (INDEPENDENT_AMBULATORY_CARE_PROVIDER_SITE_OTHER): Payer: Medicare Other

## 2014-05-12 DIAGNOSIS — Z23 Encounter for immunization: Secondary | ICD-10-CM

## 2014-12-28 ENCOUNTER — Other Ambulatory Visit: Payer: Self-pay | Admitting: Family Medicine

## 2015-03-28 ENCOUNTER — Other Ambulatory Visit: Payer: Self-pay | Admitting: Family Medicine

## 2015-04-07 ENCOUNTER — Ambulatory Visit (INDEPENDENT_AMBULATORY_CARE_PROVIDER_SITE_OTHER): Payer: Medicare Other | Admitting: Family Medicine

## 2015-04-07 ENCOUNTER — Encounter: Payer: Self-pay | Admitting: Family Medicine

## 2015-04-07 VITALS — BP 110/60 | HR 73 | Ht 61.0 in | Wt 144.0 lb

## 2015-04-07 DIAGNOSIS — M8580 Other specified disorders of bone density and structure, unspecified site: Secondary | ICD-10-CM | POA: Diagnosis not present

## 2015-04-07 DIAGNOSIS — E038 Other specified hypothyroidism: Secondary | ICD-10-CM

## 2015-04-07 DIAGNOSIS — Z23 Encounter for immunization: Secondary | ICD-10-CM

## 2015-04-07 DIAGNOSIS — J301 Allergic rhinitis due to pollen: Secondary | ICD-10-CM | POA: Diagnosis not present

## 2015-04-07 DIAGNOSIS — L80 Vitiligo: Secondary | ICD-10-CM | POA: Insufficient documentation

## 2015-04-07 LAB — CBC WITH DIFFERENTIAL/PLATELET
BASOS PCT: 0 % (ref 0–1)
Basophils Absolute: 0 10*3/uL (ref 0.0–0.1)
EOS ABS: 0.3 10*3/uL (ref 0.0–0.7)
EOS PCT: 5 % (ref 0–5)
HCT: 39.3 % (ref 36.0–46.0)
Hemoglobin: 13.6 g/dL (ref 12.0–15.0)
LYMPHS ABS: 1.4 10*3/uL (ref 0.7–4.0)
Lymphocytes Relative: 25 % (ref 12–46)
MCH: 31.6 pg (ref 26.0–34.0)
MCHC: 34.6 g/dL (ref 30.0–36.0)
MCV: 91.4 fL (ref 78.0–100.0)
MONO ABS: 0.4 10*3/uL (ref 0.1–1.0)
MONOS PCT: 7 % (ref 3–12)
MPV: 10.6 fL (ref 8.6–12.4)
Neutro Abs: 3.6 10*3/uL (ref 1.7–7.7)
Neutrophils Relative %: 63 % (ref 43–77)
PLATELETS: 224 10*3/uL (ref 150–400)
RBC: 4.3 MIL/uL (ref 3.87–5.11)
RDW: 14 % (ref 11.5–15.5)
WBC: 5.7 10*3/uL (ref 4.0–10.5)

## 2015-04-07 LAB — COMPREHENSIVE METABOLIC PANEL
ALT: 17 U/L (ref 6–29)
AST: 23 U/L (ref 10–35)
Albumin: 4.1 g/dL (ref 3.6–5.1)
Alkaline Phosphatase: 80 U/L (ref 33–130)
BUN: 20 mg/dL (ref 7–25)
CHLORIDE: 102 mmol/L (ref 98–110)
CO2: 28 mmol/L (ref 20–31)
Calcium: 9.6 mg/dL (ref 8.6–10.4)
Creat: 0.7 mg/dL (ref 0.50–0.99)
Glucose, Bld: 99 mg/dL (ref 65–99)
POTASSIUM: 4.3 mmol/L (ref 3.5–5.3)
Sodium: 141 mmol/L (ref 135–146)
TOTAL PROTEIN: 6.7 g/dL (ref 6.1–8.1)
Total Bilirubin: 0.5 mg/dL (ref 0.2–1.2)

## 2015-04-07 MED ORDER — LEVOTHYROXINE SODIUM 100 MCG PO TABS: 100.0000 ug | ORAL_TABLET | Freq: Every day | ORAL | Status: DC

## 2015-04-07 NOTE — Progress Notes (Signed)
   Subjective:    Patient ID: [name], female    DOB: 09-28-1945, 69 y.o.   MRN: [id number]  HPI She is here for an interval evaluation. He does have underlying allergies with occasional sneezing, itchy watery eyes, rhinorrheaand takes OTC medications for this. She also takes Synthroid for her hypothyroid. She's had no skin or hair changes, hot or cold intolerance. She has a history of osteopenia and is taking a multivitamin with calcium and vitamin D. She had a DEXA scan last year.Otherwise she is doing quite well. She has no other concerns or complaints.   Review of Systems     Objective:   Physical Exam Alert and in no distress. Tympanic membranes and canals are normal. Pharyngeal area is normal. Neck is supple without adenopathy or thyromegaly. Cardiac exam shows a regular sinus rhythm without murmurs or gallops. Lungs are clear to auscultation. Exam of her skin does show scattered areas of hypopigmentation. These have been with her her entire life but do seem to be getting slightly worse.       Assessment & Plan:  Other specified hypothyroidism - Plan: TSH, CBC with Differential/Platelet, Comprehensive metabolic panel, levothyroxine (SYNTHROID, LEVOTHROID) 100 MCG tablet  Allergic rhinitis due to pollen  Osteopenia - Plan: Vit D  25 hydroxy (rtn osteoporosis monitoring), CBC with Differential/Platelet, Comprehensive metabolic panel  Need for prophylactic vaccination and inoculation against influenza - Plan: Flu vaccine HIGH DOSE PF (Fluzone High dose)  Vitiligo - Plan: CBC with Differential/Platelet, Comprehensive metabolic panel

## 2015-04-08 LAB — TSH: TSH: 1.648 u[IU]/mL (ref 0.350–4.500)

## 2015-04-08 LAB — VITAMIN D 25 HYDROXY (VIT D DEFICIENCY, FRACTURES): VIT D 25 HYDROXY: 28 ng/mL — AB (ref 30–100)

## 2015-04-28 ENCOUNTER — Other Ambulatory Visit: Payer: Self-pay | Admitting: Family Medicine

## 2015-06-19 DIAGNOSIS — M79604 Pain in right leg: Secondary | ICD-10-CM | POA: Diagnosis not present

## 2015-10-20 ENCOUNTER — Other Ambulatory Visit: Payer: Self-pay

## 2015-10-20 DIAGNOSIS — Z1231 Encounter for screening mammogram for malignant neoplasm of breast: Secondary | ICD-10-CM

## 2015-10-28 ENCOUNTER — Ambulatory Visit: Payer: Self-pay

## 2015-12-02 ENCOUNTER — Ambulatory Visit
Admission: RE | Admit: 2015-12-02 | Discharge: 2015-12-02 | Disposition: A | Discharge status: Home / Self Care | Payer: Medicare Other | Source: Ambulatory Visit

## 2015-12-02 DIAGNOSIS — Z1231 Encounter for screening mammogram for malignant neoplasm of breast: Secondary | ICD-10-CM | POA: Diagnosis not present

## 2016-03-14 ENCOUNTER — Telehealth: Payer: Self-pay | Admitting: Family Medicine

## 2016-03-14 NOTE — Telephone Encounter (Signed)
Pt has CPE scheduled on 04/19/16. Requesting refill on Levothyroxine 100 mcg to last until appt

## 2016-03-15 ENCOUNTER — Other Ambulatory Visit: Payer: Self-pay

## 2016-03-15 DIAGNOSIS — E038 Other specified hypothyroidism: Secondary | ICD-10-CM

## 2016-03-15 MED ORDER — LEVOTHYROXINE SODIUM 100 MCG PO TABS: 100.0000 ug | ORAL_TABLET | Freq: Every day | ORAL | 0 refills | Status: DC

## 2016-03-15 NOTE — Telephone Encounter (Signed)
done

## 2016-03-15 NOTE — Telephone Encounter (Signed)
TSH med sent in

## 2016-04-19 ENCOUNTER — Encounter: Payer: Medicare Other | Admitting: Family Medicine

## 2016-04-19 ENCOUNTER — Other Ambulatory Visit: Payer: Self-pay | Admitting: Family Medicine

## 2016-04-19 DIAGNOSIS — E038 Other specified hypothyroidism: Secondary | ICD-10-CM

## 2016-05-17 ENCOUNTER — Ambulatory Visit (INDEPENDENT_AMBULATORY_CARE_PROVIDER_SITE_OTHER): Payer: Medicare Other | Admitting: Family Medicine

## 2016-05-17 ENCOUNTER — Encounter: Payer: Self-pay | Admitting: Family Medicine

## 2016-05-17 VITALS — BP 110/70 | HR 77 | Ht 62.5 in | Wt 137.4 lb

## 2016-05-17 DIAGNOSIS — J301 Allergic rhinitis due to pollen: Secondary | ICD-10-CM

## 2016-05-17 DIAGNOSIS — E038 Other specified hypothyroidism: Secondary | ICD-10-CM

## 2016-05-17 DIAGNOSIS — Z1159 Encounter for screening for other viral diseases: Secondary | ICD-10-CM

## 2016-05-17 DIAGNOSIS — Z23 Encounter for immunization: Secondary | ICD-10-CM

## 2016-05-17 DIAGNOSIS — M8580 Other specified disorders of bone density and structure, unspecified site: Secondary | ICD-10-CM

## 2016-05-17 DIAGNOSIS — L80 Vitiligo: Secondary | ICD-10-CM | POA: Diagnosis not present

## 2016-05-17 DIAGNOSIS — Z0000 Encounter for general adult medical examination without abnormal findings: Secondary | ICD-10-CM | POA: Diagnosis not present

## 2016-05-17 DIAGNOSIS — Z1211 Encounter for screening for malignant neoplasm of colon: Secondary | ICD-10-CM

## 2016-05-17 LAB — CBC WITH DIFFERENTIAL/PLATELET
Basophils Absolute: 44 cells/uL (ref 0–200)
Basophils Relative: 1 %
EOS PCT: 9 %
Eosinophils Absolute: 396 cells/uL (ref 15–500)
HCT: 41.7 % (ref 35.0–45.0)
Hemoglobin: 13.6 g/dL (ref 11.7–15.5)
LYMPHS ABS: 1408 {cells}/uL (ref 850–3900)
LYMPHS PCT: 32 %
MCH: 30.9 pg (ref 27.0–33.0)
MCHC: 32.6 g/dL (ref 32.0–36.0)
MCV: 94.8 fL (ref 80.0–100.0)
MPV: 10.8 fL (ref 7.5–12.5)
Monocytes Absolute: 484 cells/uL (ref 200–950)
Monocytes Relative: 11 %
NEUTROS PCT: 47 %
Neutro Abs: 2068 cells/uL (ref 1500–7800)
Platelets: 239 10*3/uL (ref 140–400)
RBC: 4.4 MIL/uL (ref 3.80–5.10)
RDW: 13.9 % (ref 11.0–15.0)
WBC: 4.4 10*3/uL (ref 4.0–10.5)

## 2016-05-17 LAB — POCT URINALYSIS DIPSTICK
Bilirubin, UA: NEGATIVE
Blood, UA: NEGATIVE
GLUCOSE UA: NEGATIVE
KETONES UA: NEGATIVE
Leukocytes, UA: NEGATIVE
Nitrite, UA: NEGATIVE
Protein, UA: NEGATIVE
SPEC GRAV UA: 1.025
UROBILINOGEN UA: NEGATIVE
pH, UA: 6

## 2016-05-17 LAB — TSH: TSH: 1.95 mIU/L

## 2016-05-17 MED ORDER — LEVOTHYROXINE SODIUM 100 MCG PO TABS: 100.0000 ug | ORAL_TABLET | Freq: Every day | ORAL | 3 refills | Status: DC

## 2016-05-17 NOTE — Patient Instructions (Signed)
  [name] , Thank you for taking time to come for your Medicare Wellness Visit. I appreciate your ongoing commitment to your health goals. Please review the following plan we discussed and let me know if I can assist you in the future.   These are the goals we discussed: Increasing physical activity  This is a list of the screening recommended for you and due dates:  Health Maintenance  Topic Date Due  .  Hepatitis C: One time screening is recommended by Center for Disease Control  (CDC) for  adults born from [birth year] through 1964.   Dec 08, 1945  . Tetanus Vaccine  06/21/1966  . Pneumonia vaccines (2 of 2 - PPSV23) 12/16/2014  . Colon Cancer Screening  09/01/2015  . Flu Shot  02/14/2016  . Mammogram  12/01/2017  . DEXA scan (bone density measurement)  Completed  . Shingles Vaccine  Completed

## 2016-05-17 NOTE — Progress Notes (Signed)
Subjective:   HPI  [name] is a 70 y.o. female who presents for a complete physical and an annual wellness check.  Medical care team includes:  N/A   Preventative care: Last ophthalmology visit: 2 years ago Last dental visit: 10/17 Last colonoscopy:08/31/05 Last mammogram: 12/05/15 Last gynecological exam:Headache 66 Last EKG: ? Last labs:04/07/15  Prior vaccinations: TD or Tdap:2003 Influenza:04/07/15 Pneumococcal:23: 02/13/02 13:12/15/13 Shingles/Zostavax:06/30/07  Advanced directive:Yes.copy requested  Concerns: She does have a history of hypothyroidism and is doing quite nicely on her present thyroid dosing. She also has a history of allergic rhinitis and takes medications on an as-needed basis. She does have vitiligo and occasionally has seen dermatology in the past for this. She also has a history of osteopenia. She does take a multivitamin as well as aspirin every day. She does not smoke or drink. 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 She is also helping to take care of her daughter who apparently had a stroke in her mid 15s. Reviewed their medical, surgical, family, social, medication, and allergy history and updated chart as appropriate.    Review of Systems Negative except as above    Objective:   Physical Exam  Vitals:   05/17/16 1357  BP: 110/70  Pulse: 77    General appearance: alert, no distress, WD/WN,  Skin: Hypopigmentation is noted on the neck and anterior chest. HEENT: normocephalic, conjunctiva/corneas normal, sclerae anicteric, PERRLA, EOMi, nares patent, no discharge or erythema, pharynx normal Oral cavity: MMM, tongue normal, teeth normal Neck: supple, no lymphadenopathy, no thyromegaly, no masses, normal ROM Chest: non tender, normal shape and expansion Heart: RRR, normal S1, S2, no murmurs Lungs: CTA bilaterally, no wheezes, rhonchi, or rales Abdomen: +bs, soft, non tender, non distended, no masses, no  hepatomegaly, no splenomegaly, no bruits Musculoskeletal: upper extremities non tender, no obvious deformity, normal ROM throughout, lower extremities non tender, no obvious deformity, normal ROM throughout Extremities: no edema, no cyanosis, no clubbing Pulses: 2+ symmetric, upper and lower extremities, normal cap refill Neurological: alert, oriented x 3, CN2-12 intact, strength normal upper extremities and lower extremities, sensation normal throughout, DTRs 2+ throughout, no cerebellar signs, gait normal Psychiatric: normal affect, behavior normal, pleasant     Assessment and Plan :   Screening for colon cancer - Plan: Cologuard  Need for prophylactic vaccination against Streptococcus pneumoniae (pneumococcus) - Plan: CANCELED: Pneumococcal conjugate vaccine 13-valent  Need for prophylactic vaccination and inoculation against influenza - Plan: Flu vaccine HIGH DOSE PF (Fluzone High dose)  Other specified hypothyroidism - Plan: levothyroxine (SYNTHROID, LEVOTHROID) 100 MCG tablet, TSH  Chronic allergic rhinitis due to pollen, unspecified seasonality  Vitiligo  Routine general medical examination at a health care facility - Plan: CBC with Differential/Platelet, Comprehensive metabolic panel, POCT Urinalysis Dipstick  Osteopenia, unspecified location  Need for hepatitis C screening test - Plan: Hepatitis C antibody She will continue on her present medications. Appropriate labs were ordered. Her immunizations were updated. Recommend getting the 3-D mammogram with her next mammography. Discussed the vitiligo and possible referral to dermatology at her request. Encouraged her to remain physically active.  Physical exam - discussed healthy lifestyle, diet, exercise, preventative care, vaccinations, and addressed their concerns.  Handout given. Follow-up one year or sooner if problems.

## 2016-05-18 LAB — COMPREHENSIVE METABOLIC PANEL
ALT: 15 U/L (ref 6–29)
AST: 24 U/L (ref 10–35)
Albumin: 4.1 g/dL (ref 3.6–5.1)
Alkaline Phosphatase: 73 U/L (ref 33–130)
BILIRUBIN TOTAL: 0.5 mg/dL (ref 0.2–1.2)
BUN: 21 mg/dL (ref 7–25)
CO2: 26 mmol/L (ref 20–31)
CREATININE: 0.74 mg/dL (ref 0.50–0.99)
Calcium: 9.3 mg/dL (ref 8.6–10.4)
Chloride: 104 mmol/L (ref 98–110)
GLUCOSE: 93 mg/dL (ref 65–99)
Potassium: 4.3 mmol/L (ref 3.5–5.3)
SODIUM: 138 mmol/L (ref 135–146)
Total Protein: 7 g/dL (ref 6.1–8.1)

## 2016-05-19 LAB — HEPATITIS C ANTIBODY: HCV AB: NEGATIVE

## 2016-07-30 ENCOUNTER — Ambulatory Visit (INDEPENDENT_AMBULATORY_CARE_PROVIDER_SITE_OTHER): Payer: Medicare Other | Admitting: Family Medicine

## 2016-07-30 VITALS — BP 120/70 | HR 91 | Temp 98.1°F | Wt 136.4 lb

## 2016-07-30 DIAGNOSIS — A084 Viral intestinal infection, unspecified: Secondary | ICD-10-CM | POA: Diagnosis not present

## 2016-07-30 MED ORDER — ONDANSETRON HCL 4 MG/5ML PO SOLN: 4.0000 mg | Freq: Three times a day (TID) | ORAL | 0 refills | Status: DC | PRN

## 2016-07-30 NOTE — Patient Instructions (Signed)

## 2016-07-30 NOTE — Progress Notes (Signed)
   Subjective:    Patient ID: [name], female    DOB: 11-17-1945, 71 y.o.   MRN: [id number]  HPI She has a four-day history this started with diarrhea, vomiting, myalgias, malaise. She visited her father in the hospital apparently had similar symptoms in both she and her 2 brothers came down with the same symptoms.   Review of Systems     Objective:   Physical Exam Alert and in no distress. Tympanic membranes and canals are normal. Pharyngeal area is normal. Neck is supple without adenopathy or thyromegaly. Cardiac exam shows a regular sinus rhythm without murmurs or gallops. Lungs are clear to auscultation. Abdominal exam shows active bowel sounds with slight midepigastric discomfort.       Assessment & Plan:  Viral gastroenteritis - Plan: ondansetron (ZOFRAN) 4 MG/5ML solution Encouraged her to eat anything she felt comfortable eating. Continue using Imodium as well as Zofran. Work note given.

## 2016-07-31 ENCOUNTER — Telehealth: Payer: Self-pay

## 2016-07-31 ENCOUNTER — Other Ambulatory Visit: Payer: Self-pay

## 2016-07-31 MED ORDER — DIPHENOXYLATE-ATROPINE 2.5-0.025 MG PO TABS: 2.0000 | ORAL_TABLET | Freq: Four times a day (QID) | ORAL | 0 refills | Status: DC | PRN

## 2016-07-31 NOTE — Telephone Encounter (Signed)
Do you mean have her start with 1 tablet two times per day?

## 2016-07-31 NOTE — Telephone Encounter (Signed)
Let her know that I called in Lomotil. She needs to be careful with this medicine. She may use up to 2 tablets 4 times per day. Have her start with 1 tablet or times per day and increase it to 2 if no benefit.

## 2016-07-31 NOTE — Telephone Encounter (Signed)
She is having worsening trouble with diarrhea. I will switch her to Lomotil and explain possible side effects.

## 2016-07-31 NOTE — Telephone Encounter (Signed)
Pt reports diarrhea is now explosive and wants to know what to do next. [name]

## 2016-07-31 NOTE — Telephone Encounter (Signed)
Pt called back again and stated that she is really sick and can not control the diarrhea. She states that she is very weak. Pt uses [facility] in [place] and can be reached at [phone].

## 2016-07-31 NOTE — Telephone Encounter (Signed)
Pt  Is aware of to start out with 1 tab 4 times a day if it doesnt help then go 2 tab 4 times a day

## 2016-10-29 ENCOUNTER — Encounter: Payer: Self-pay | Admitting: Medical

## 2016-10-29 ENCOUNTER — Ambulatory Visit (INDEPENDENT_AMBULATORY_CARE_PROVIDER_SITE_OTHER): Payer: Medicare Other | Admitting: Medical

## 2016-10-29 VITALS — BP 112/70 | HR 67 | Temp 98.4°F | Wt 135.4 lb

## 2016-10-29 DIAGNOSIS — J301 Allergic rhinitis due to pollen: Secondary | ICD-10-CM

## 2016-10-29 DIAGNOSIS — J029 Acute pharyngitis, unspecified: Secondary | ICD-10-CM | POA: Diagnosis not present

## 2016-10-29 DIAGNOSIS — R05 Cough: Secondary | ICD-10-CM

## 2016-10-29 DIAGNOSIS — R059 Cough, unspecified: Secondary | ICD-10-CM | POA: Insufficient documentation

## 2016-10-29 MED ORDER — BENZONATATE 200 MG PO CAPS: 200.0000 mg | ORAL_CAPSULE | Freq: Three times a day (TID) | ORAL | 0 refills | Status: DC | PRN

## 2016-10-29 NOTE — Progress Notes (Signed)
Subjective: Chief Complaint  Patient presents with  . congestion    runny nose, coughing, sore throat,chills, upset stomach   Here for feeling yucky.  Started 3 days ago with runny nose, coughing, sore throat, upset stomach.  Started when she was outside in the cold and damp weather.  Friday was on [address] with a convertible car, may have been too cold and windy.  Last Thursday mowed the grass.  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  Using some OTC cough drops, tylenol.    No sick contacts, no strep throat contacts.   No fever.  Has had some itchy and watery eyes but says she has allergy problems in general. No fever, no wheezing, no SOB, no NVD.  No other aggravating or relieving factors. No other complaint.  Past Medical History:  Diagnosis Date  . Allergy    RHINITIS  . Osteoporosis    OSTEOPENIA  . Thyroid disease    HYPOTHYROID   Current Outpatient Prescriptions on File Prior to Visit  Medication Sig Dispense Refill  . aspirin 81 MG tablet Take 81 mg by mouth daily.      Marland Kitchen levothyroxine (SYNTHROID, LEVOTHROID) 100 MCG tablet Take 1 tablet (100 mcg total) by mouth daily. 90 tablet 3  . loratadine (CLARITIN) 10 MG tablet Take 10 mg by mouth daily.    . Multiple Vitamins-Minerals (MULTIVITAMIN WITH MINERALS) tablet Take 1 tablet by mouth daily.     No current facility-administered medications on file prior to visit.    ROS as in subjective   Objective: BP 112/70   Pulse 67   Temp 98.4 F (36.9 C)   Wt 135 lb 6.4 oz (61.4 kg)   SpO2 98%   BMI 24.37 kg/m   General appearance: alert, no distress, WD/WN,  HEENT: normocephalic, sclerae anicteric, mildly injected conjunctiva, TMs pearly, nares patent, but pink swollen turbinated, clear discharge, no erythema, pharynx with mild erythema Oral cavity: MMM, no lesions Neck: supple, no lymphadenopathy, no thyromegaly, no masses Lungs: CTA bilaterally, no wheezes, rhonchi, or  rales    Assessment: Encounter Diagnoses  Name Primary?  . Sore throat Yes  . Cough   . Allergic rhinitis due to pollen, unspecified seasonality      Plan: Exam and symptoms suggest allergic rhinitis, post nasal drainage leading to cough and sore throat.   She is currently using Claritin in the mornings.    Advised she change to or add QHS zyrtec or Benadryl OTC for the next week or 2 at least, can use Flonase OTC nasal spray as well.   Avoid allergens if possible, use mask when mowing.  Take daily shower and can use nasal saline flush to remove pollen in the evening.  Can use Tessalon Perles for cough prn.  F/u prn or if worse or not improving in the next several days.

## 2016-11-19 DIAGNOSIS — L03114 Cellulitis of left upper limb: Secondary | ICD-10-CM | POA: Diagnosis not present

## 2016-12-22 ENCOUNTER — Other Ambulatory Visit: Payer: Self-pay | Admitting: Internal Medicine

## 2016-12-22 ENCOUNTER — Ambulatory Visit (HOSPITAL_COMMUNITY)
Admission: RE | Admit: 2016-12-22 | Discharge: 2016-12-22 | Disposition: A | Discharge status: Home / Self Care | Payer: Medicare Other | Source: Ambulatory Visit | Attending: Internal Medicine | Admitting: Internal Medicine

## 2016-12-22 DIAGNOSIS — S99921A Unspecified injury of right foot, initial encounter: Secondary | ICD-10-CM | POA: Diagnosis not present

## 2016-12-22 DIAGNOSIS — S92591A Other fracture of right lesser toe(s), initial encounter for closed fracture: Secondary | ICD-10-CM | POA: Diagnosis not present

## 2016-12-22 DIAGNOSIS — S62606A Fracture of unspecified phalanx of right little finger, initial encounter for closed fracture: Secondary | ICD-10-CM | POA: Diagnosis not present

## 2016-12-22 DIAGNOSIS — M79671 Pain in right foot: Secondary | ICD-10-CM

## 2016-12-22 DIAGNOSIS — M79674 Pain in right toe(s): Secondary | ICD-10-CM | POA: Diagnosis not present

## 2016-12-22 DIAGNOSIS — S40861A Insect bite (nonvenomous) of right upper arm, initial encounter: Secondary | ICD-10-CM | POA: Diagnosis not present

## 2017-01-17 ENCOUNTER — Other Ambulatory Visit: Payer: Self-pay | Admitting: Family Medicine

## 2017-01-17 DIAGNOSIS — Z1231 Encounter for screening mammogram for malignant neoplasm of breast: Secondary | ICD-10-CM

## 2017-01-24 ENCOUNTER — Ambulatory Visit: Payer: Medicare Other

## 2017-01-29 ENCOUNTER — Telehealth: Payer: Self-pay | Admitting: Family Medicine

## 2017-01-29 NOTE — Telephone Encounter (Signed)
Pt called and states that she broke her 2 toes and is having a hard time getting around and wants to know if you would sign a handicap placard for her,or if you need her to come in for that ,she can be reached at [phone].

## 2017-02-01 ENCOUNTER — Ambulatory Visit: Payer: Medicare Other

## 2017-02-21 ENCOUNTER — Ambulatory Visit
Admission: RE | Admit: 2017-02-21 | Discharge: 2017-02-21 | Disposition: A | Discharge status: Home / Self Care | Payer: Medicare Other | Source: Ambulatory Visit | Attending: Family Medicine | Admitting: Family Medicine

## 2017-02-21 DIAGNOSIS — Z1231 Encounter for screening mammogram for malignant neoplasm of breast: Secondary | ICD-10-CM | POA: Diagnosis not present

## 2017-04-11 ENCOUNTER — Other Ambulatory Visit (INDEPENDENT_AMBULATORY_CARE_PROVIDER_SITE_OTHER): Payer: Medicare Other

## 2017-04-11 DIAGNOSIS — Z23 Encounter for immunization: Secondary | ICD-10-CM

## 2017-05-24 ENCOUNTER — Telehealth: Payer: Self-pay

## 2017-05-24 NOTE — Telephone Encounter (Signed)
Fax rcvd from [facility] that order has expired. LM to CB.

## 2017-05-27 NOTE — Telephone Encounter (Signed)
LM to CB

## 2017-05-30 NOTE — Telephone Encounter (Signed)
Letter mailed to pt.  

## 2017-06-13 DIAGNOSIS — D0471 Carcinoma in situ of skin of right lower limb, including hip: Secondary | ICD-10-CM | POA: Diagnosis not present

## 2017-06-13 DIAGNOSIS — L80 Vitiligo: Secondary | ICD-10-CM | POA: Diagnosis not present

## 2017-06-13 DIAGNOSIS — C44722 Squamous cell carcinoma of skin of right lower limb, including hip: Secondary | ICD-10-CM | POA: Diagnosis not present

## 2017-06-13 DIAGNOSIS — D485 Neoplasm of uncertain behavior of skin: Secondary | ICD-10-CM | POA: Diagnosis not present

## 2017-07-04 DIAGNOSIS — C44722 Squamous cell carcinoma of skin of right lower limb, including hip: Secondary | ICD-10-CM | POA: Diagnosis not present

## 2017-07-15 ENCOUNTER — Other Ambulatory Visit: Payer: Self-pay | Admitting: Medical

## 2017-07-15 ENCOUNTER — Other Ambulatory Visit: Payer: Self-pay | Admitting: Family Medicine

## 2017-07-15 DIAGNOSIS — E038 Other specified hypothyroidism: Secondary | ICD-10-CM

## 2017-07-25 ENCOUNTER — Ambulatory Visit (INDEPENDENT_AMBULATORY_CARE_PROVIDER_SITE_OTHER): Payer: Medicare Other | Admitting: Family Medicine

## 2017-07-25 ENCOUNTER — Telehealth: Payer: Self-pay

## 2017-07-25 ENCOUNTER — Encounter: Payer: Self-pay | Admitting: Family Medicine

## 2017-07-25 VITALS — BP 122/82 | HR 71 | Wt 130.8 lb

## 2017-07-25 DIAGNOSIS — E038 Other specified hypothyroidism: Secondary | ICD-10-CM

## 2017-07-25 DIAGNOSIS — L80 Vitiligo: Secondary | ICD-10-CM | POA: Diagnosis not present

## 2017-07-25 DIAGNOSIS — J301 Allergic rhinitis due to pollen: Secondary | ICD-10-CM | POA: Diagnosis not present

## 2017-07-25 DIAGNOSIS — C4492 Squamous cell carcinoma of skin, unspecified: Secondary | ICD-10-CM | POA: Diagnosis not present

## 2017-07-25 DIAGNOSIS — M8580 Other specified disorders of bone density and structure, unspecified site: Secondary | ICD-10-CM

## 2017-07-25 DIAGNOSIS — Z79899 Other long term (current) drug therapy: Secondary | ICD-10-CM

## 2017-07-25 LAB — CBC WITH DIFFERENTIAL/PLATELET
BASOS ABS: 57 {cells}/uL (ref 0–200)
Basophils Relative: 1 %
EOS PCT: 5.9 %
Eosinophils Absolute: 336 cells/uL (ref 15–500)
HEMATOCRIT: 43.1 % (ref 35.0–45.0)
HEMOGLOBIN: 14.4 g/dL (ref 11.7–15.5)
Lymphs Abs: 1550 cells/uL (ref 850–3900)
MCH: 30.7 pg (ref 27.0–33.0)
MCHC: 33.4 g/dL (ref 32.0–36.0)
MCV: 91.9 fL (ref 80.0–100.0)
MPV: 11.2 fL (ref 7.5–12.5)
Monocytes Relative: 9.1 %
NEUTROS ABS: 3238 {cells}/uL (ref 1500–7800)
Neutrophils Relative %: 56.8 %
PLATELETS: 262 10*3/uL (ref 140–400)
RBC: 4.69 10*6/uL (ref 3.80–5.10)
RDW: 13 % (ref 11.0–15.0)
Total Lymphocyte: 27.2 %
WBC: 5.7 10*3/uL (ref 3.8–10.8)
WBCMIX: 519 {cells}/uL (ref 200–950)

## 2017-07-25 LAB — COMPREHENSIVE METABOLIC PANEL
AG Ratio: 1.6 (calc) (ref 1.0–2.5)
ALKALINE PHOSPHATASE (APISO): 67 U/L (ref 33–130)
ALT: 14 U/L (ref 6–29)
AST: 20 U/L (ref 10–35)
Albumin: 4.5 g/dL (ref 3.6–5.1)
BUN: 16 mg/dL (ref 7–25)
CO2: 27 mmol/L (ref 20–32)
CREATININE: 0.69 mg/dL (ref 0.60–0.93)
Calcium: 10.2 mg/dL (ref 8.6–10.4)
Chloride: 102 mmol/L (ref 98–110)
GLUCOSE: 94 mg/dL (ref 65–99)
Globulin: 2.9 g/dL (calc) (ref 1.9–3.7)
Potassium: 4.4 mmol/L (ref 3.5–5.3)
Sodium: 139 mmol/L (ref 135–146)
Total Bilirubin: 0.5 mg/dL (ref 0.2–1.2)
Total Protein: 7.4 g/dL (ref 6.1–8.1)

## 2017-07-25 LAB — TSH: TSH: 0.76 m[IU]/L (ref 0.40–4.50)

## 2017-07-25 MED ORDER — LEVOTHYROXINE SODIUM 100 MCG PO TABS: ORAL_TABLET | ORAL | 3 refills | Status: DC

## 2017-07-25 NOTE — Patient Instructions (Signed)
  [name] , Thank you for taking time to come for your Medicare Wellness Visit. I appreciate your ongoing commitment to your health goals. Please review the following plan we discussed and let me know if I can assist you in the future.   These are the goals we discussed: Goals    None      This is a list of the screening recommended for you and due dates:  Health Maintenance  Topic Date Due  . Tetanus Vaccine  06/21/1966  . Pneumonia vaccines (2 of 2 - PPSV23) 12/16/2014  . Colon Cancer Screening  09/01/2015  . Mammogram  02/22/2019  . Flu Shot  Completed  . DEXA scan (bone density measurement)  Completed  .  Hepatitis C: One time screening is recommended by Center for Disease Control  (CDC) for  adults born from [birth year] through 1964.   Completed

## 2017-07-25 NOTE — Telephone Encounter (Signed)
Called and notified patient that she was scheduled for bone density with GI on January 31st, at 9:30, advised her of the instructions of no metal and no calcium medication, also gave address, and suite number.  Gave our number if she had any questions.

## 2017-07-25 NOTE — Progress Notes (Signed)
[name] is a 72 y.o. female who presents for annual wellness visit and follow-up on chronic medical conditions.  She has no particular concerns or complaints.  She continues on her thyroid medication without difficulty.  Her allergies because no trouble.  She does use Claritin for them.  She is also on a multivitamin.  She does have a history of osteopenia.  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  She is in a relationship and seems to be enjoying this.  Immunizations and Health Maintenance Immunization History  Administered Date(s) Administered  . DTaP 02/13/2002  . Influenza Split 05/29/2011, 05/12/2012, 04/28/2013  . Influenza, High Dose Seasonal PF 05/12/2014, 04/07/2015, 05/17/2016, 04/11/2017  . Pneumococcal Conjugate-13 12/15/2013  . Pneumococcal Polysaccharide-23 02/13/2002  . Zoster 06/30/2007   Health Maintenance Due  Topic Date Due  . TETANUS/TDAP  06/21/1966  . PNA vac Low Risk Adult (2 of 2 - PPSV23) 12/16/2014  . COLONOSCOPY  09/01/2015    Last Pap smear:N/A Last mammogram:2020 Last colonoscopy: Cologuard ordered Last DEXA: doneDentist: Ophtho:[name] Exercise:Y 2x/week  Other doctors caring for patient include:[name].[name] Advanced directives:yes    Depression screen:  See questionnaire below.  Depression screen PHQ 2/9 05/17/2016  Decreased Interest 0  Down, Depressed, Hopeless 0  PHQ - 2 Score 0    Fall Risk Screen: see questionnaire below. Fall Risk  05/17/2016  Falls in the past year? No    ADL screen:  See questionnaire below Functional Status Survey:  Still working   Review of Systems Constitutional: -, -unexpected weight change, -anorexia, -fatigue Dermatology: denies changing moles, rash, lumps ENT: -runny nose, -ear pain, -sore throat,  Cardiology:  -chest pain, -palpitations, -orthopnea, Respiratory: -cough, -shortness of breath, -dyspnea on exertion, -wheezing,  Gastroenterology: -abdominal pain, -nausea,  -vomiting, -diarrhea, -constipation, -dysphagia Hematology: -bleeding or bruising problems Musculoskeletal: -arthralgias, -myalgias, -joint swelling, -back pain, - Ophthalmology: -vision changes,  Urology: -dysuria, -difficulty urinating,  -urinary frequency, -urgency, incontinence Neurology: -, -numbness, , -memory loss, -falls, -dizziness    PHYSICAL EXAM:  BP 122/82 (BP Location: Right Arm, Patient Position: Sitting)   Pulse 71   Wt 130 lb 12.8 oz (59.3 kg)   SpO2 96%   BMI 23.54 kg/m   General Appearance: Alert, cooperative, no distress, appears stated age Head: Normocephalic, without obvious abnormality, atraumatic Eyes: PERRL, conjunctiva/corneas clear, EOM's intact, fundi benign Ears: Normal TM's and external ear canals Nose: Nares normal, mucosa normal, no drainage or sinus tenderness Throat: Lips, mucosa, and tongue normal; teeth and gums normal Neck: Supple, no lymphadenopathy;  thyroid:  no enlargement/tenderness/nodules; no carotid bruit or JVD Lungs: Clear to auscultation bilaterally without wheezes, rales or ronchi; respirations unlabored Heart: Regular rate and rhythm, S1 and S2 normal, no murmur, rubor gallop Extremities: No clubbing, cyanosis or edema Pulses: 2+ and symmetric all extremities Skin:  Skin color, texture, turgor normal, no rashes or lesions Lymph nodes: Cervical, supraclavicular, and axillary nodes normal Neurologic:  CNII-XII intact, normal strength, sensation and gait; reflexes 2+ and symmetric throughout Psych: Normal mood, affect, hygiene and grooming.  ASSESSMENT/PLAN: Allergic rhinitis due to pollen, unspecified seasonality  Other specified hypothyroidism - Plan: CBC with Differential/Platelet, Comprehensive metabolic panel, TSH, levothyroxine (SYNTHROID, LEVOTHROID) 100 MCG tablet  Vitiligo  Osteopenia, unspecified location - Plan: CBC with Differential/Platelet, Comprehensive metabolic panel, DG Bone Density  Encounter for long-term  (current) use of medications - Plan: CBC with Differential/Platelet, Comprehensive metabolic panel  Cancer of skin, squamous cell I encouraged her to continue to  take good care of herself.  She will follow-up with dermatology concerning for skin cancer.  Continue on thyroid as well as a multivitamin.  DEXA scan ordered.     Medicare Attestation I have personally reviewed: The patient's medical and social history Their use of alcohol, tobacco or illicit drugs Their current medications and supplements The patient's functional ability including ADLs,fall risks, home safety risks, cognitive, and hearing and visual impairment Diet and physical activities Evidence for depression or mood disorders  The patient's weight, height, and BMI have been recorded in the chart.  I have made referrals, counseling, and provided education to the patient based on review of the above and I have provided the patient with a written personalized care plan for preventive services.     Jill Alexanders, MD   07/25/2017

## 2017-08-15 ENCOUNTER — Other Ambulatory Visit: Payer: Medicare Other

## 2017-08-30 ENCOUNTER — Ambulatory Visit
Admission: RE | Admit: 2017-08-30 | Discharge: 2017-08-30 | Disposition: A | Discharge status: Home / Self Care | Payer: Medicare Other | Source: Ambulatory Visit | Attending: Family Medicine | Admitting: Family Medicine

## 2017-08-30 DIAGNOSIS — Z780 Asymptomatic menopausal state: Secondary | ICD-10-CM | POA: Diagnosis not present

## 2017-08-30 DIAGNOSIS — M8580 Other specified disorders of bone density and structure, unspecified site: Secondary | ICD-10-CM

## 2017-08-30 DIAGNOSIS — M8589 Other specified disorders of bone density and structure, multiple sites: Secondary | ICD-10-CM | POA: Diagnosis not present

## 2017-09-06 DIAGNOSIS — D1801 Hemangioma of skin and subcutaneous tissue: Secondary | ICD-10-CM | POA: Diagnosis not present

## 2017-09-06 DIAGNOSIS — L821 Other seborrheic keratosis: Secondary | ICD-10-CM | POA: Diagnosis not present

## 2017-09-06 DIAGNOSIS — D225 Melanocytic nevi of trunk: Secondary | ICD-10-CM | POA: Diagnosis not present

## 2017-09-07 ENCOUNTER — Emergency Department (HOSPITAL_COMMUNITY): Payer: Medicare Other

## 2017-09-07 ENCOUNTER — Encounter (HOSPITAL_COMMUNITY): Payer: Self-pay | Admitting: Emergency Medicine

## 2017-09-07 ENCOUNTER — Emergency Department (HOSPITAL_COMMUNITY)
Admission: EM | Admit: 2017-09-07 | Discharge: 2017-09-07 | Disposition: A | Discharge status: Home / Self Care | Payer: Medicare Other | Attending: Emergency Medicine | Admitting: Emergency Medicine

## 2017-09-07 ENCOUNTER — Other Ambulatory Visit: Payer: Self-pay

## 2017-09-07 DIAGNOSIS — R0789 Other chest pain: Secondary | ICD-10-CM | POA: Insufficient documentation

## 2017-09-07 DIAGNOSIS — R0602 Shortness of breath: Secondary | ICD-10-CM | POA: Diagnosis not present

## 2017-09-07 DIAGNOSIS — E039 Hypothyroidism, unspecified: Secondary | ICD-10-CM | POA: Diagnosis not present

## 2017-09-07 DIAGNOSIS — R911 Solitary pulmonary nodule: Secondary | ICD-10-CM | POA: Diagnosis not present

## 2017-09-07 DIAGNOSIS — Z7982 Long term (current) use of aspirin: Secondary | ICD-10-CM | POA: Diagnosis not present

## 2017-09-07 DIAGNOSIS — R079 Chest pain, unspecified: Secondary | ICD-10-CM | POA: Diagnosis not present

## 2017-09-07 DIAGNOSIS — Z79899 Other long term (current) drug therapy: Secondary | ICD-10-CM | POA: Insufficient documentation

## 2017-09-07 DIAGNOSIS — R110 Nausea: Secondary | ICD-10-CM | POA: Diagnosis not present

## 2017-09-07 DIAGNOSIS — R918 Other nonspecific abnormal finding of lung field: Secondary | ICD-10-CM

## 2017-09-07 LAB — CBC
HCT: 37.5 % (ref 36.0–46.0)
HEMOGLOBIN: 12.1 g/dL (ref 12.0–15.0)
MCH: 30.5 pg (ref 26.0–34.0)
MCHC: 32.3 g/dL (ref 30.0–36.0)
MCV: 94.5 fL (ref 78.0–100.0)
PLATELETS: 261 10*3/uL (ref 150–400)
RBC: 3.97 MIL/uL (ref 3.87–5.11)
RDW: 13 % (ref 11.5–15.5)
WBC: 10.2 10*3/uL (ref 4.0–10.5)

## 2017-09-07 LAB — BASIC METABOLIC PANEL
ANION GAP: 13 (ref 5–15)
BUN: 13 mg/dL (ref 6–20)
CO2: 23 mmol/L (ref 22–32)
Calcium: 9.4 mg/dL (ref 8.9–10.3)
Chloride: 105 mmol/L (ref 101–111)
Creatinine, Ser: 0.57 mg/dL (ref 0.44–1.00)
GLUCOSE: 109 mg/dL — AB (ref 65–99)
Potassium: 4 mmol/L (ref 3.5–5.1)
Sodium: 141 mmol/L (ref 135–145)

## 2017-09-07 LAB — C-REACTIVE PROTEIN: CRP: 11.3 mg/dL — ABNORMAL HIGH (ref <1.0)

## 2017-09-07 LAB — I-STAT TROPONIN, ED: TROPONIN I, POC: 0 ng/mL (ref 0.00–0.08)

## 2017-09-07 LAB — SEDIMENTATION RATE: SED RATE: 75 mm/h — AB (ref 0–22)

## 2017-09-07 LAB — TROPONIN I: Troponin I: <0.03 ng/mL (ref <0.03)

## 2017-09-07 LAB — D-DIMER, QUANTITATIVE: D-Dimer, Quant: 0.88 ug/mL-FEU — ABNORMAL HIGH (ref 0.00–0.50)

## 2017-09-07 MED ORDER — NITROGLYCERIN 0.4 MG SL SUBL
0.4000 mg | SUBLINGUAL_TABLET | Freq: Once | SUBLINGUAL | Status: AC
  Administered 2017-09-07: 0.4 mg via SUBLINGUAL
  Filled 2017-09-07: qty 1

## 2017-09-07 MED ORDER — IOPAMIDOL (ISOVUE-370) INJECTION 76%
100.0000 mL | Freq: Once | INTRAVENOUS | Status: AC | PRN
  Administered 2017-09-07: 100 mL via INTRAVENOUS

## 2017-09-07 MED ORDER — ASPIRIN 81 MG PO CHEW
324.0000 mg | CHEWABLE_TABLET | Freq: Once | ORAL | Status: AC
  Administered 2017-09-07: 324 mg via ORAL
  Filled 2017-09-07: qty 4

## 2017-09-07 NOTE — ED Notes (Signed)
Patient transported to X-ray 

## 2017-09-07 NOTE — ED Notes (Signed)
Patient transported to CT 

## 2017-09-07 NOTE — ED Provider Notes (Signed)
[facility] EMERGENCY DEPARTMENT Provider Note   CSN: [id number] Arrival date & time: 09/07/17  0050     History   Chief Complaint Chief Complaint  Patient presents with  . Chest Pain    HPI [name] is a 72 y.o. female.  HPI patient states she has been "sick all week".  She states she started out with a sore throat last week and then February 18 got laryngitis.  She states that is now better.  She states she has had some cough with gray white mucus, and she has chills a week ago, she is unsure of fever.  She had posttussive vomiting once.  She describes decreased appetite.  She has had some nausea without vomiting or diarrhea.  She states she went to bed about 4 or watch TV to about 11 PM and then went to sleep.  She states she was awakened about 11:30 PM with sharp right-sided chest pain that lasted just under an hour.  To me she denies nausea, vomiting or diaphoresis although she told the triage nurse she did.  She states she had some shortness of breath.  She states maybe it radiated into her back but she was not sure.  She states the pain was initially sharp and then became aching.  She states she feels okay now.  She denies any history of coronary artery disease in herself or her family.  She denies any prior history of hypertension or diabetes or high cholesterol.  PCP Denita Lung, MD   Past Medical History:  Diagnosis Date  . Allergy    RHINITIS  . Osteoporosis    OSTEOPENIA  . Thyroid disease    HYPOTHYROID    Patient Active Problem List   Diagnosis Date Noted  . Vitiligo 04/07/2015  . Hypothyroid 05/29/2011  . Allergic rhinitis due to pollen 05/29/2011  . Osteopenia 05/29/2011    Past Surgical History:  Procedure Laterality Date  . APPENDECTOMY    . arm surgery Right   . CHOLECYSTECTOMY      OB History    No data available       Home Medications    Prior to Admission medications   Medication Sig Start Date End Date Taking? Authorizing  Provider  aspirin 81 MG tablet Take 81 mg by mouth daily.      [provider]  benzonatate (TESSALON) 200 MG capsule Take 1 capsule (200 mg total) by mouth 3 (three) times daily as needed for cough. Patient not taking: Reported on 07/25/2017 10/29/16   Tysinger, Camelia Eng, PA-C  levothyroxine (SYNTHROID, LEVOTHROID) 100 MCG tablet TAKE 1 TABLET(100 MCG) BY MOUTH DAILY 07/25/17   Denita Lung, MD  loratadine (CLARITIN) 10 MG tablet Take 10 mg by mouth daily.    [provider]  Multiple Vitamins-Minerals (MULTIVITAMIN WITH MINERALS) tablet Take 1 tablet by mouth daily.    [provider]    Family History Family History  Problem Relation Age of Onset  . Stroke Mother   . Arthritis Mother     Social History Social History   Tobacco Use  . Smoking status: Never Smoker  . Smokeless tobacco: Never Used  Substance Use Topics  . Alcohol use: No  . Drug use: No  works in reception at a doctors office    Allergies   Levofloxacin   Review of Systems Review of Systems  All other systems reviewed and are negative.    Physical Exam Updated Vital Signs BP 140/77 (  BP Location: Right Arm)   Pulse 79   Temp 99 F (37.2 C) (Oral)   Resp 18   Ht 5\' 2"  (1.575 m)   Wt 59 kg (130 lb)   SpO2 96%   BMI 23.78 kg/m   Vital signs normal    Physical Exam  Constitutional: She is oriented to person, place, and time. She appears well-developed and well-nourished.  Non-toxic appearance. She does not appear ill. No distress.  HENT:  Head: Normocephalic and atraumatic.  Right Ear: External ear normal.  Left Ear: External ear normal.  Nose: Nose normal. No mucosal edema or rhinorrhea.  Mouth/Throat: Oropharynx is clear and moist and mucous membranes are normal. No dental abscesses or uvula swelling.  Eyes: Conjunctivae and EOM are normal. Pupils are equal, round, and reactive to light.  Neck: Normal range of motion and full passive range of motion without pain.  Neck supple.  Cardiovascular: Normal rate, regular rhythm and normal heart sounds. Exam reveals no gallop and no friction rub.  No murmur heard. Pulmonary/Chest: Effort normal and breath sounds normal. No respiratory distress. She has no wheezes. She has no rhonchi. She has no rales. She exhibits no tenderness and no crepitus.  Area of pain noted    Abdominal: Soft. Normal appearance and bowel sounds are normal. She exhibits no distension. There is no tenderness. There is no rebound and no guarding.  Musculoskeletal: Normal range of motion. She exhibits no edema or tenderness.  Moves all extremities well.   Neurological: She is alert and oriented to person, place, and time. She has normal strength. No cranial nerve deficit.  Skin: Skin is warm, dry and intact. No rash noted. No erythema. No pallor.  Psychiatric: Her speech is normal and behavior is normal. Her mood appears anxious.  Nursing note and vitals reviewed.    ED Treatments / Results  Labs (all labs ordered are listed, but only abnormal results are displayed) Results for orders placed or performed during the hospital encounter of 44/03/47  Basic metabolic panel  Result Value Ref Range   Sodium 141 135 - 145 mmol/L   Potassium 4.0 3.5 - 5.1 mmol/L   Chloride 105 101 - 111 mmol/L   CO2 23 22 - 32 mmol/L   Glucose, Bld 109 (H) 65 - 99 mg/dL   BUN 13 6 - 20 mg/dL   Creatinine, Ser 0.57 0.44 - 1.00 mg/dL   Calcium 9.4 8.9 - 10.3 mg/dL   GFR calc non Af Amer >60 >60 mL/min   GFR calc Af Amer >60 >60 mL/min   Anion gap 13 5 - 15  CBC  Result Value Ref Range   WBC 10.2 4.0 - 10.5 K/uL   RBC 3.97 3.87 - 5.11 MIL/uL   Hemoglobin 12.1 12.0 - 15.0 g/dL   HCT 37.5 36.0 - 46.0 %   MCV 94.5 78.0 - 100.0 fL   MCH 30.5 26.0 - 34.0 pg   MCHC 32.3 30.0 - 36.0 g/dL   RDW 13.0 11.5 - 15.5 %   Platelets 261 150 - 400 K/uL  D-dimer, quantitative  Result Value Ref Range   D-Dimer, Quant 0.88 (H) 0.00 - 0.50 ug/mL-FEU  Troponin I    Result Value Ref Range   Troponin I <0.03 <0.03 ng/mL  Sedimentation rate  Result Value Ref Range   Sed Rate 75 (H) 0 - 22 mm/hr  I-stat troponin, ED  Result Value Ref Range   Troponin i, poc 0.00 0.00 - 0.08 ng/mL  Comment 3           Laboratory interpretation all normal except borderline elevated d-dimer    EKG  EKG Interpretation  Date/Time:  Saturday September 07 2017 00:59:42 EST Ventricular Rate:  95 PR Interval:  138 QRS Duration: 70 QT Interval:  358 QTC Calculation: 449 R Axis:   5 Text Interpretation:  Normal sinus rhythm Inferior infarct , age undetermined Anterior infarct , age undetermined No old tracing to compare Confirmed by Rolland Porter [id number]) on 09/07/2017 1:53:59 AM       # 2 EKG during Chest Pain  EKG Interpretation  Date/Time:  Saturday September 07 2017 03:14:11 EST Ventricular Rate:  88 PR Interval:  138 QRS Duration: 90 QT Interval:  384 QTC Calculation: 465 R Axis:   21 Text Interpretation:  Sinus rhythm Abnormal R-wave progression, early transition Probable inferior infarct, old No significant change since last tracing EARLIER SAME DATE Confirmed by Rolland Porter [id number]) on 09/07/2017 3:25:49 AM        Radiology Dg Chest 2 View  Result Date: 09/07/2017 CLINICAL DATA:  Right sided chest pain radiating to the back EXAM: CHEST  2 VIEW COMPARISON:  Report 06/24/2003 FINDINGS: No consolidation or effusion. Patchy atelectasis or scarring at the lingula. Possible 11 mm nodule in the peripheral right lower lung, this is possibly anterior on the lateral view. No pneumothorax. Normal heart size. Aortic atherosclerosis IMPRESSION: 1. No acute infiltrate or edema 2. Possible 11 mm right lower lung nodule. Further evaluation with chest CT may be obtained. Electronically Signed   By: Donavan Foil M.D.   On: 09/07/2017 02:02   Ct Angio Chest Pe W/cm &/or Wo Cm  Result Date: 09/07/2017 CLINICAL DATA:  72 year old female with chest pain. Concern for pulmonary  embolism. EXAM: CT ANGIOGRAPHY CHEST WITH CONTRAST TECHNIQUE: Multidetector CT imaging of the chest was performed using the standard protocol during bolus administration of intravenous contrast. Multiplanar CT image reconstructions and MIPs were obtained to evaluate the vascular anatomy. CONTRAST:  183mL ISOVUE-370 IOPAMIDOL (ISOVUE-370) INJECTION 76% COMPARISON:  Chest radiograph dated 09/07/2017 FINDINGS: Cardiovascular: There is no cardiomegaly or pericardial effusion. The thoracic aorta is unremarkable. The origins of the great vessels of the aortic arch are patent. There is no CT evidence of pulmonary embolism. Mediastinum/Nodes: Right hilar adenopathy measures 15 mm in short axis esophagus is grossly unremarkable. No mediastinal fluid collection. There is prominence of the left lobe of the thyroid gland, likely related to underlying nodules. Ultrasound may provide better evaluation. Lungs/Pleura: Multiple bilateral ground-glass and consolidative nodules with small central cavitation. Findings may represent fungal infection, septic emboli, abscesses, or TB. Clinical correlation is recommended. There is no pleural effusion or pneumothorax. The central airways are patent. Upper Abdomen: No acute abnormality. Musculoskeletal: Mild degenerative changes of the spine. No acute osseous pathology. Review of the MIP images confirms the above findings. IMPRESSION: 1. No CT evidence of pulmonary embolism. 2. Bilateral pulmonary nodules as described. Differentials include fungal infection, septic emboli, abscess, or TB. Clinical correlation recommended. Electronically Signed   By: Anner Crete M.D.   On: 09/07/2017 04:37     Procedures Procedures (including critical care time)  Medications Ordered in ED Medications  aspirin chewable tablet 324 mg (324 mg Oral Given 09/07/17 0330)  nitroGLYCERIN (NITROSTAT) SL tablet 0.4 mg (0.4 mg Sublingual Given 09/07/17 0331)  iopamidol (ISOVUE-370) 76 % injection 100 mL  (100 mLs Intravenous Contrast Given 09/07/17 0414)     Initial Impression / Assessment and Plan /  ED Course  I have reviewed the triage vital signs and the nursing notes.  Pertinent labs & imaging results that were available during my care of the patient were reviewed by me and considered in my medical decision making (see chart for details).     Patient's initial troponin was normal.  We discussed getting a delta troponin around 430 to 5 AM which would be a 4-1/2-5-hour post onset of chest pain.  We discussed her chest x-ray result in need to get a CT scan in the future.  Patient's initial d-dimer is borderline high corrected for age.  CTA of the chest was ordered to look for PE.  This also might clarify the nodule seen on her chest.  It also might show a subclinical pneumonia.  3:10 AM nurse reports patient is complaining of chest pain on the right side again.  Repeat EKG was ordered.  This did not show any acute changes.  Patient CT shows bilateral pulmonary nodules of uncertain significance.  Patient denies having any pets or birds.  She denies recent travel.  She denies any known exposure to TB even while she is working.  I added a sed rate, CRP, blood cultures due to her delta troponin blood work.  I also ordered a TB test.  8:04 AM Dr. Grandville Silos, hospitalist.  Suggest talking to Dr. Baxter Flattery, infectious disease.  He feels like patient can be discharged to follow-up as outpatient.  8:45 AM Dr. Graylon Good suggests having patient follow-up with the health department to determine if she does have TB.  She can be treated as an outpatient and follow-up with her primary care doctor and will probably need a pulmonology referral.  She did not feel any further testing or medications were needed in the ED today.  She states the patient could be advised to wear a mask in public until the issue of TB is resolved.  I have talked to the patient about the rest of her findings that are available tonight and what  the specialist recommend.  She states the last time she coughed up any mucus was yesterday.  She does state that her doctor's office does see immigrant patient population.  She denies any recent traveling.  We discussed she should wear a mask out in public until her TB test returns.  She was given the number for [facility] pul[facility]ology to follow-up for further testing to get a tissue diagnosis of what is going on.  We discussed she could have bronchoscopy done to get a tissue diagnosis or possibly IR tissue sampling in radiology.  She is to keep a fever log so she can show the pulmonologist.  However we did discuss if she seems worse such as a high fever, trouble breathing, or any other worsening symptoms she should return to the ED.   Final Clinical Impressions(s) / ED Diagnoses   Final diagnoses:  Atypical chest pain  Pulmonary nodules    ED Discharge Orders    None      Plan discharge  Rolland Porter, MD, Barbette Or, MD 09/07/17 [phone]

## 2017-09-07 NOTE — ED Triage Notes (Signed)
Pt states she has been sick for over a week. Pt states tonight she had chest pain the right side of her chest that woke her up from sleep. Pt states she was diaphoretic and nauseas.

## 2017-09-07 NOTE — Discharge Instructions (Signed)
I did a blood TB test on you tonight, that is a send out blood test and hopefully will result early in the week.  Meanwhile try to wear a mask when you are around other people or out in public.  Please monitor yourself for a fever and keep a log of your temperature so you can show that to the pulmonologist when you see them.  Please call [facility] on Monday to get a follow-up appointment for "multiple pulmonary nodules felt to be infectious in both lungs".  However in the meantime if you get a high fever like 103 degrees, start struggling to breathe, have confusion, uncontrolled vomiting, you should return to the ED for reevaluation.  Take the cough medicine that you showed me as needed, you can take Tylenol for pain.

## 2017-09-07 NOTE — ED Notes (Signed)
Pt returned from CT Scan 

## 2017-09-07 NOTE — ED Notes (Signed)
Pt states that when she coughs the chest pain feels like it penetrates through her. When sitting though, there is a decrease in pain. Pain only increases when taking a deep breath or when coughing.

## 2017-09-07 NOTE — ED Notes (Signed)
ED Provider at bedside. 

## 2017-09-10 ENCOUNTER — Ambulatory Visit (HOSPITAL_COMMUNITY)
Admission: RE | Admit: 2017-09-10 | Discharge: 2017-09-10 | Disposition: A | Discharge status: Home / Self Care | Payer: Medicare Other | Source: Ambulatory Visit | Attending: Pulmonary Disease | Admitting: Pulmonary Disease

## 2017-09-10 ENCOUNTER — Encounter: Payer: Self-pay | Admitting: Pulmonary Disease

## 2017-09-10 ENCOUNTER — Ambulatory Visit: Payer: Medicare Other | Admitting: Pulmonary Disease

## 2017-09-10 ENCOUNTER — Other Ambulatory Visit: Payer: Medicare Other

## 2017-09-10 ENCOUNTER — Telehealth: Payer: Self-pay | Admitting: Pulmonary Disease

## 2017-09-10 VITALS — BP 136/78 | HR 73 | Ht 62.0 in | Wt 130.0 lb

## 2017-09-10 DIAGNOSIS — R059 Cough, unspecified: Secondary | ICD-10-CM

## 2017-09-10 DIAGNOSIS — R05 Cough: Secondary | ICD-10-CM | POA: Diagnosis not present

## 2017-09-10 DIAGNOSIS — R918 Other nonspecific abnormal finding of lung field: Secondary | ICD-10-CM | POA: Diagnosis not present

## 2017-09-10 DIAGNOSIS — R911 Solitary pulmonary nodule: Secondary | ICD-10-CM | POA: Diagnosis not present

## 2017-09-10 MED ORDER — SODIUM CHLORIDE 3 % IN NEBU
4.0000 mL | INHALATION_SOLUTION | Freq: Once | RESPIRATORY_TRACT | Status: DC
  Filled 2017-09-10 (×2): qty 4

## 2017-09-10 MED ORDER — SODIUM CHLORIDE 3 % IN NEBU
4.0000 mL | INHALATION_SOLUTION | Freq: Once | RESPIRATORY_TRACT | Status: AC
  Administered 2017-09-10: 4 mL via RESPIRATORY_TRACT
  Filled 2017-09-10 (×2): qty 4

## 2017-09-10 MED ORDER — AZITHROMYCIN 250 MG PO TABS: ORAL_TABLET | ORAL | 0 refills | Status: AC

## 2017-09-10 NOTE — Progress Notes (Signed)
[name]    [id number]    [birth year]  Primary Care Physician:Lalonde, Elyse Jarvis, MD  Referring Physician: Denita Lung, [place],  [address]  Chief complaint: Abnormal CT  HPI: 72 year old with history of allergies, osteoporosis, hypothyroidism.   Reports attending a funeral for sister on 2/15 and developed respiratory tract infection with cough, dark mucus shortly thereafter.  She had one episode of nighttime chills, no reported fevers.  No hemoptysis Evaluated in the ED on 2/23 with right chest pain ruled out for cardiac causes.  She had a CT chest that shows bilateral nodular infiltrates with possible cavitation and has been referred here for further evaluation She has no risk factors for TB, denies any hemoptysis, night sweats, fevers, hemoptysis.  She is not making any more sputum  Pets: None Occupation: Works as a Art therapist Exposures: No known exposures, no mold, hot tub Smoking history: Never smoker Travel History: Born and raised in [place]..  No foreign travel.  Outpatient Encounter Medications as of 09/10/2017  Medication Sig  . aspirin 81 MG tablet Take 81 mg by mouth daily.    . Calcium Citrate (CITRACAL PO) Take 1,000 mg by mouth.  . levothyroxine (SYNTHROID, LEVOTHROID) 100 MCG tablet TAKE 1 TABLET(100 MCG) BY MOUTH DAILY  . loratadine (CLARITIN) 10 MG tablet Take 10 mg by mouth daily.  . Multiple Vitamins-Minerals (MULTIVITAMIN WITH MINERALS) tablet Take 1 tablet by mouth daily.  . [DISCONTINUED] benzonatate (TESSALON) 200 MG capsule Take 1 capsule (200 mg total) by mouth 3 (three) times daily as needed for cough.   No facility-administered encounter medications on file as of 09/10/2017.     Allergies as of 09/10/2017 - Review Complete 09/10/2017  Allergen Reaction Noted  . Levofloxacin  09/06/2011    Past Medical History:  Diagnosis Date  . Allergy    RHINITIS  . Osteoporosis    OSTEOPENIA  .  Thyroid disease    HYPOTHYROID    Past Surgical History:  Procedure Laterality Date  . APPENDECTOMY    . arm surgery Right   . CHOLECYSTECTOMY      Family History  Problem Relation Age of Onset  . Stroke Mother   . Arthritis Mother     Social History   Socioeconomic History  . Marital status: Married    Spouse name: Not on file  . Number of children: Not on file  . Years of education: Not on file  . Highest education level: Not on file  Social Needs  . Financial resource strain: Not on file  . Food insecurity - worry: Not on file  . Food insecurity - inability: Not on file  . Transportation needs - medical: Not on file  . Transportation needs - non-medical: Not on file  Occupational History  . Not on file  Tobacco Use  . Smoking status: Never Smoker  . Smokeless tobacco: Never Used  Substance and Sexual Activity  . Alcohol use: No  . Drug use: No  . Sexual activity: Not Currently  Other Topics Concern  . Not on file  Social History Narrative  . Not on file    Review of systems: Review of Systems  Constitutional: Negative for fever and chills.  HENT: Negative.   Eyes: Negative for blurred vision.  Respiratory: as per HPI  Cardiovascular: Negative for chest pain and palpitations.  Gastrointestinal: Negative for vomiting, diarrhea, blood per rectum. Genitourinary: Negative for dysuria, urgency, frequency  and hematuria.  Musculoskeletal: Negative for myalgias, back pain and joint pain.  Skin: Negative for itching and rash.  Neurological: Negative for dizziness, tremors, focal weakness, seizures and loss of consciousness.  Endo/Heme/Allergies: Negative for environmental allergies.  Psychiatric/Behavioral: Negative for depression, suicidal ideas and hallucinations.  All other systems reviewed and are negative.  Physical Exam: Blood pressure 136/78, pulse 73, height 5\' 2"  (1.575 m), weight 130 lb (59 kg), SpO2 98 %. Gen:      No acute distress HEENT:  EOMI,  sclera anicteric Neck:     No masses; no thyromegaly Lungs:    Clear to auscultation bilaterally; normal respiratory effort CV:         Regular rate and rhythm; no murmurs Abd:      + bowel sounds; soft, non-tender; no palpable masses, no distension Ext:    No edema; adequate peripheral perfusion Skin:      Warm and dry; no rash Neuro: alert and oriented x 3 Psych: normal mood and affect  Data Reviewed:  Labs 09/07/17 QuantiFERON gold 09/07/17-pending  CBC    Component Value Date/Time   WBC 10.2 09/07/2017 0120   RBC 3.97 09/07/2017 0120   HGB 12.1 09/07/2017 0120   HCT 37.5 09/07/2017 0120   PLT 261 09/07/2017 0120   MCV 94.5 09/07/2017 0120   MCH 30.5 09/07/2017 0120   MCHC 32.3 09/07/2017 0120   RDW 13.0 09/07/2017 0120   LYMPHSABS 1,550 07/25/2017 1136   MONOABS 484 05/17/2016 1357   EOSABS 336 07/25/2017 1136   BASOSABS 57 07/25/2017 1136    Blood culture 09/07/17-negative  CT angio 09/07/17- no evidence of pulmonary embolism, bilateral pulmonary nodules, consolidative changes, right hilar adenopathy I have reviewed the images personally  Assessment:  Consult for abnormal CT scan Likely to be infectious from viral infection, community-acquired pneumonia. No point in testing for the flu now as it has been many days since symptom onset and she feels well now.  CT scan is read as fungal, TB infection however clinical suspicion is low. Suspicion for lung malignancy is low as she is a non smoker.  We will review the QuantiFERON test when it is done, induce sputum for AFB Give her a Z-Pak and repeat CT scan in 1 month to make sure that the infiltrates have resolved. No indication for bronchoscopy at present.. We can reconsider if the follow up CT is still abnormal.  Check serologies for connective tissue disease including ANA, rheumatoid factor, ANCA, CCP  More then 1/2 the time of the 40 min visit was spent in counseling and/or coordination of care with the patient and  family.  Plan/Recommendations: - Sputum for AFB, fungus, regular cultures - Follow Quantiferon test - Z pack, Follow up CT in 1 month - Check CTD serologies  Marshell Garfinkel MD [facility] Pager [phone] 09/10/2017, 10:28 AM  CC: Denita Lung, MD

## 2017-09-10 NOTE — Patient Instructions (Addendum)
We can get some sputum for cultures including regular cultures, fungus, AFB Prescribe Z-Pak We will check lab work today including rheumatoid factor, CCP, ANA, ANCA Follow-up CT without contrast in 1 month  At this point I think it is unlikely that you have TB, fungus infection or cancer.  We will reevaluate after follow-up scan. Return to clinic after 1 month

## 2017-09-10 NOTE — Addendum Note (Signed)
Addended by: Maryanna Shape A on: 09/10/2017 11:45 AM   Modules accepted: Orders

## 2017-09-10 NOTE — Telephone Encounter (Signed)
[name] with lab is aware that pt was to have labs drawn here and go to [facility] for sputum. Nothing further is needed.

## 2017-09-11 LAB — QUANTIFERON-TB GOLD PLUS (RQFGPL)
QuantiFERON Mitogen Value: 0.55 [IU]/mL
QuantiFERON Nil Value: 0.02 [IU]/mL
QuantiFERON TB1 Ag Value: 0.02 [IU]/mL
QuantiFERON TB2 Ag Value: 0.02 [IU]/mL

## 2017-09-11 LAB — QUANTIFERON-TB GOLD PLUS: QuantiFERON-TB Gold Plus: NEGATIVE

## 2017-09-12 ENCOUNTER — Telehealth: Payer: Self-pay | Admitting: Pulmonary Disease

## 2017-09-12 LAB — CULTURE, BLOOD (ROUTINE X 2)
CULTURE: NO GROWTH
Culture: NO GROWTH
Special Requests: ADEQUATE
Special Requests: ADEQUATE

## 2017-09-12 LAB — ANA: Anti Nuclear Antibody(ANA): POSITIVE — AB

## 2017-09-12 LAB — RHEUMATOID FACTOR

## 2017-09-12 LAB — CYCLIC CITRUL PEPTIDE ANTIBODY, IGG: Cyclic Citrullin Peptide Ab: <16 UNITS

## 2017-09-12 LAB — ANCA SCREEN W REFLEX TITER: ANCA SCREEN: NEGATIVE

## 2017-09-12 LAB — ANTI-NUCLEAR AB-TITER (ANA TITER)

## 2017-09-12 NOTE — Telephone Encounter (Signed)
Phone note printed off for patient and taken out to the lobby to discuss available lab results Pt voiced her understanding and is aware sputum culture results may take another couple of weeks  Patient asked if she needs to continue the zpak - advised yes, this is broad spectrum and if the sputum results come back with a specific germ then the abx can be changed.  Patient asked if she is safe to return back to work - advised yes, per PM and she should continue good hand hygiene and good manners when coughing.  Pt asked if she should call back once done with the zpak if her symptoms are not resolved - advised that zpak continues working x1 week after completeing course for 2weeks of coverage; if her symptoms do not continue to improve after that time to call the office.  Pt reported that she is feeling better since office visit with PM Pt voiced her understanding on all of the above  Nothing further needed; will sign off

## 2017-09-12 NOTE — Telephone Encounter (Signed)
Called and spoke with pt and she is wanting the results of the labs that was done by PM.  PM please advise of the lab results. Thanks

## 2017-09-12 NOTE — Telephone Encounter (Signed)
Patient waiting in lobby; states she has been calling throughout the day and have not got a response back about her results; pt requesting her results

## 2017-09-12 NOTE — Telephone Encounter (Signed)
Pt is calling back [phone]  Or [phone]

## 2017-09-12 NOTE — Telephone Encounter (Signed)
The tests so far are negative. There is a couple that are still pending and we will call when they are available. The TB test done in ED last week is negative.  Continue current management. Ok to return to work.  Marshell Garfinkel MD [facility] Pager [phone] If no answer or after 3pm call: [phone] 09/12/2017, 3:00 PM

## 2017-10-10 ENCOUNTER — Ambulatory Visit (HOSPITAL_COMMUNITY)
Admission: RE | Admit: 2017-10-10 | Discharge: 2017-10-10 | Disposition: A | Discharge status: Home / Self Care | Payer: Medicare Other | Source: Ambulatory Visit | Attending: Pulmonary Disease | Admitting: Pulmonary Disease

## 2017-10-10 ENCOUNTER — Ambulatory Visit: Payer: Medicare Other | Admitting: Pulmonary Disease

## 2017-10-10 DIAGNOSIS — I700 Atherosclerosis of aorta: Secondary | ICD-10-CM | POA: Insufficient documentation

## 2017-10-10 DIAGNOSIS — R911 Solitary pulmonary nodule: Secondary | ICD-10-CM | POA: Diagnosis present

## 2017-10-10 DIAGNOSIS — R918 Other nonspecific abnormal finding of lung field: Secondary | ICD-10-CM | POA: Insufficient documentation

## 2017-10-11 ENCOUNTER — Ambulatory Visit: Payer: Medicare Other | Admitting: Pulmonary Disease

## 2017-10-11 ENCOUNTER — Encounter: Payer: Self-pay | Admitting: Pulmonary Disease

## 2017-10-11 DIAGNOSIS — R918 Other nonspecific abnormal finding of lung field: Secondary | ICD-10-CM | POA: Diagnosis not present

## 2017-10-11 NOTE — Patient Instructions (Signed)
We will get a follow up CT without contrast in 2 years.  Your CT scan from yesterday shows resolution of lung inflammation which is likely from a pneumonia I am glad you are feeling better Please call us if you have any further symptoms interim otherwise I will follow you up in 2 years

## 2017-10-11 NOTE — Progress Notes (Signed)
[name]    [id number]    1945/12/13  Primary Care Physician:Lalonde, Elyse Jarvis, MD  Referring Physician: Denita Lung, [address] [place], [place] [address]  Chief complaint: Follow up for abnormal CT  HPI: 72 year old with history of allergies, osteoporosis, hypothyroidism.   Reports attending a funeral for sister on 2/15 and developed respiratory tract infection with cough, dark mucus shortly thereafter.  She had one episode of nighttime chills, no reported fevers.  No hemoptysis Evaluated in the ED on 2/23 with right chest pain ruled out for cardiac causes.  She had a CT chest that shows bilateral nodular infiltrates with possible cavitation and has been referred here for further evaluation She has no risk factors for TB, denies any hemoptysis, night sweats, fevers, hemoptysis.  She is not making any more sputum  Pets: None Occupation: Works as a Art therapist Exposures: No known exposures, no mold, hot tub Smoking history: Never smoker Travel History: Born and raised in [place]..  No foreign travel.  Interim history: Treated with Z-Pak at last visit.  She states that this improved her symptoms.  She feels back to baseline now with no cough, sputum production, fevers, chills.  No new complaints today.  Outpatient Encounter Medications as of 10/11/2017  Medication Sig  . aspirin 81 MG tablet Take 81 mg by mouth daily.    . Calcium Citrate (CITRACAL PO) Take 1,000 mg by mouth.  . levothyroxine (SYNTHROID, LEVOTHROID) 100 MCG tablet TAKE 1 TABLET(100 MCG) BY MOUTH DAILY  . loratadine (CLARITIN) 10 MG tablet Take 10 mg by mouth daily.  . Multiple Vitamins-Minerals (MULTIVITAMIN WITH MINERALS) tablet Take 1 tablet by mouth daily.   No facility-administered encounter medications on file as of 10/11/2017.     Allergies as of 10/11/2017 - Review Complete 10/11/2017  Allergen Reaction Noted  . Levofloxacin  09/06/2011    Past Medical  History:  Diagnosis Date  . Allergy    RHINITIS  . Osteoporosis    OSTEOPENIA  . Thyroid disease    HYPOTHYROID    Past Surgical History:  Procedure Laterality Date  . APPENDECTOMY    . arm surgery Right   . CHOLECYSTECTOMY      Family History  Problem Relation Age of Onset  . Stroke Mother   . Arthritis Mother     Social History   Socioeconomic History  . Marital status: Widowed    Spouse name: Not on file  . Number of children: Not on file  . Years of education: Not on file  . Highest education level: Not on file  Occupational History  . Not on file  Social Needs  . Financial resource strain: Not on file  . Food insecurity:    Worry: Not on file    Inability: Not on file  . Transportation needs:    Medical: Not on file    Non-medical: Not on file  Tobacco Use  . Smoking status: Never Smoker  . Smokeless tobacco: Never Used  Substance and Sexual Activity  . Alcohol use: No  . Drug use: No  . Sexual activity: Not Currently  Lifestyle  . Physical activity:    Days per week: Not on file    Minutes per session: Not on file  . Stress: Not on file  Relationships  . Social connections:    Talks on phone: Not on file    Gets together: Not on file    Attends  religious service: Not on file    Active member of club or organization: Not on file    Attends meetings of clubs or organizations: Not on file    Relationship status: Not on file  . Intimate partner violence:    Fear of current or ex partner: Not on file    Emotionally abused: Not on file    Physically abused: Not on file    Forced sexual activity: Not on file  Other Topics Concern  . Not on file  Social History Narrative  . Not on file    Review of systems: Review of Systems  Constitutional: Negative for fever and chills.  HENT: Negative.   Eyes: Negative for blurred vision.  Respiratory: as per HPI  Cardiovascular: Negative for chest pain and palpitations.  Gastrointestinal: Negative for  vomiting, diarrhea, blood per rectum. Genitourinary: Negative for dysuria, urgency, frequency and hematuria.  Musculoskeletal: Negative for myalgias, back pain and joint pain.  Skin: Negative for itching and rash.  Neurological: Negative for dizziness, tremors, focal weakness, seizures and loss of consciousness.  Endo/Heme/Allergies: Negative for environmental allergies.  Psychiatric/Behavioral: Negative for depression, suicidal ideas and hallucinations.  All other systems reviewed and are negative.  Physical Exam: Blood pressure 132/86, pulse 89, height 5\' 2"  (1.575 m), weight 132 lb (59.9 kg), SpO2 97 %. Gen:      No acute distress HEENT:  EOMI, sclera anicteric Neck:     No masses; no thyromegaly Lungs:    Clear to auscultation bilaterally; normal respiratory effort CV:         Regular rate and rhythm; no murmurs Abd:      + bowel sounds; soft, non-tender; no palpable masses, no distension Ext:    No edema; adequate peripheral perfusion Skin:      Warm and dry; no rash Neuro: alert and oriented x 3 Psych: normal mood and affect  Data Reviewed: Labs 09/07/17 QuantiFERON gold 09/07/17- negative  CBC    Component Value Date/Time   WBC 10.2 09/07/2017 0120   RBC 3.97 09/07/2017 0120   HGB 12.1 09/07/2017 0120   HCT 37.5 09/07/2017 0120   PLT 261 09/07/2017 0120   MCV 94.5 09/07/2017 0120   MCH 30.5 09/07/2017 0120   MCHC 32.3 09/07/2017 0120   RDW 13.0 09/07/2017 0120   LYMPHSABS 1,550 07/25/2017 1136   MONOABS 484 05/17/2016 1357   EOSABS 336 07/25/2017 1136   BASOSABS 57 07/25/2017 1136    Blood culture 09/07/17-negative  CT angio 09/07/17- no evidence of pulmonary embolism, bilateral pulmonary nodules, consolidative changes, right hilar adenopathy CT 10/10/17-resolution of bilateral nodules, consolidative changes.  Stable right upper lobe calcified nodule consistent with granuloma. Reviewed the images personally.  Serologies 09/10/17 ANA 1:160, speckled CCP, rheumatoid  factor negative  Assessment:  Follow-up for abnormal CT scan Likely to be infectious from viral infection, community-acquired pneumonia.  She has improved with Z-Pak and follow-up CT scan shows considerable improvement. There is no suspicion for fungus, TB, MAI.  Right upper lobe subcentimeter nodule Suspicion for lung malignancy is low as she is a non smoker.  The nodule is calcified indicating benign granuloma We will do a follow-up CT scan in 2 years for reassurance  Elevated ANA Likely nonspecific as ANA has speckled pattern with low titer.  She does not have any symptoms of connective tissue disease We will continue to monitor this.  Plan/Recommendations: - Follow up CT in 2 years  Marshell Garfinkel MD [facility] Pager [phone] 10/11/2017, 9:42 AM  CC: Denita Lung, MD

## 2018-01-29 DIAGNOSIS — D229 Melanocytic nevi, unspecified: Secondary | ICD-10-CM | POA: Diagnosis not present

## 2018-01-29 DIAGNOSIS — B078 Other viral warts: Secondary | ICD-10-CM | POA: Diagnosis not present

## 2018-01-29 DIAGNOSIS — D485 Neoplasm of uncertain behavior of skin: Secondary | ICD-10-CM | POA: Diagnosis not present

## 2018-02-05 ENCOUNTER — Encounter: Payer: Self-pay | Admitting: *Deleted

## 2018-02-28 DIAGNOSIS — L905 Scar conditions and fibrosis of skin: Secondary | ICD-10-CM | POA: Diagnosis not present

## 2018-02-28 DIAGNOSIS — L821 Other seborrheic keratosis: Secondary | ICD-10-CM | POA: Diagnosis not present

## 2018-02-28 DIAGNOSIS — D235 Other benign neoplasm of skin of trunk: Secondary | ICD-10-CM | POA: Diagnosis not present

## 2018-02-28 DIAGNOSIS — D485 Neoplasm of uncertain behavior of skin: Secondary | ICD-10-CM | POA: Diagnosis not present

## 2018-02-28 DIAGNOSIS — Z85828 Personal history of other malignant neoplasm of skin: Secondary | ICD-10-CM | POA: Diagnosis not present

## 2018-02-28 HISTORY — PX: SKIN BIOPSY: SHX1

## 2018-03-14 ENCOUNTER — Encounter: Payer: Self-pay | Admitting: Family Medicine

## 2018-04-11 DIAGNOSIS — Z1211 Encounter for screening for malignant neoplasm of colon: Secondary | ICD-10-CM | POA: Diagnosis not present

## 2018-04-11 DIAGNOSIS — K5730 Diverticulosis of large intestine without perforation or abscess without bleeding: Secondary | ICD-10-CM | POA: Diagnosis not present

## 2018-04-11 LAB — HM COLONOSCOPY

## 2018-04-15 ENCOUNTER — Other Ambulatory Visit: Payer: Self-pay | Admitting: Family Medicine

## 2018-04-15 DIAGNOSIS — Z1231 Encounter for screening mammogram for malignant neoplasm of breast: Secondary | ICD-10-CM

## 2018-04-17 ENCOUNTER — Other Ambulatory Visit (INDEPENDENT_AMBULATORY_CARE_PROVIDER_SITE_OTHER): Payer: Medicare Other

## 2018-04-17 DIAGNOSIS — Z23 Encounter for immunization: Secondary | ICD-10-CM

## 2018-05-15 ENCOUNTER — Ambulatory Visit: Payer: Medicare Other

## 2018-05-16 ENCOUNTER — Ambulatory Visit: Payer: Medicare Other

## 2018-06-05 DIAGNOSIS — L820 Inflamed seborrheic keratosis: Secondary | ICD-10-CM | POA: Diagnosis not present

## 2018-06-05 DIAGNOSIS — D225 Melanocytic nevi of trunk: Secondary | ICD-10-CM | POA: Diagnosis not present

## 2018-06-05 DIAGNOSIS — L80 Vitiligo: Secondary | ICD-10-CM | POA: Diagnosis not present

## 2018-06-05 DIAGNOSIS — D1801 Hemangioma of skin and subcutaneous tissue: Secondary | ICD-10-CM | POA: Diagnosis not present

## 2018-06-05 DIAGNOSIS — L814 Other melanin hyperpigmentation: Secondary | ICD-10-CM | POA: Diagnosis not present

## 2018-06-05 DIAGNOSIS — L821 Other seborrheic keratosis: Secondary | ICD-10-CM | POA: Diagnosis not present

## 2018-06-26 ENCOUNTER — Ambulatory Visit: Payer: Medicare Other

## 2018-07-24 ENCOUNTER — Ambulatory Visit
Admission: RE | Admit: 2018-07-24 | Discharge: 2018-07-24 | Disposition: A | Discharge status: Home / Self Care | Payer: Medicare Other | Source: Ambulatory Visit | Attending: Family Medicine | Admitting: Family Medicine

## 2018-07-24 DIAGNOSIS — Z1231 Encounter for screening mammogram for malignant neoplasm of breast: Secondary | ICD-10-CM

## 2018-08-07 DIAGNOSIS — M79674 Pain in right toe(s): Secondary | ICD-10-CM | POA: Diagnosis not present

## 2018-08-07 DIAGNOSIS — M2041 Other hammer toe(s) (acquired), right foot: Secondary | ICD-10-CM | POA: Diagnosis not present

## 2018-08-11 ENCOUNTER — Other Ambulatory Visit: Payer: Self-pay | Admitting: Family Medicine

## 2018-08-11 DIAGNOSIS — E038 Other specified hypothyroidism: Secondary | ICD-10-CM

## 2018-08-14 DIAGNOSIS — H2513 Age-related nuclear cataract, bilateral: Secondary | ICD-10-CM | POA: Diagnosis not present

## 2018-08-20 DIAGNOSIS — N3000 Acute cystitis without hematuria: Secondary | ICD-10-CM | POA: Diagnosis not present

## 2018-08-25 DIAGNOSIS — H2511 Age-related nuclear cataract, right eye: Secondary | ICD-10-CM | POA: Diagnosis not present

## 2018-09-17 ENCOUNTER — Other Ambulatory Visit: Payer: Self-pay | Admitting: Family Medicine

## 2018-09-17 DIAGNOSIS — E038 Other specified hypothyroidism: Secondary | ICD-10-CM

## 2018-09-18 ENCOUNTER — Encounter: Payer: Self-pay | Admitting: Family Medicine

## 2018-09-18 ENCOUNTER — Ambulatory Visit (INDEPENDENT_AMBULATORY_CARE_PROVIDER_SITE_OTHER): Payer: Medicare Other | Admitting: Family Medicine

## 2018-09-18 VITALS — BP 108/70 | HR 70 | Temp 98.2°F | Ht 62.0 in | Wt 137.6 lb

## 2018-09-18 DIAGNOSIS — J301 Allergic rhinitis due to pollen: Secondary | ICD-10-CM | POA: Diagnosis not present

## 2018-09-18 DIAGNOSIS — E038 Other specified hypothyroidism: Secondary | ICD-10-CM

## 2018-09-18 DIAGNOSIS — Z9849 Cataract extraction status, unspecified eye: Secondary | ICD-10-CM | POA: Insufficient documentation

## 2018-09-18 DIAGNOSIS — M8580 Other specified disorders of bone density and structure, unspecified site: Secondary | ICD-10-CM | POA: Diagnosis not present

## 2018-09-18 DIAGNOSIS — Z79899 Other long term (current) drug therapy: Secondary | ICD-10-CM | POA: Diagnosis not present

## 2018-09-18 DIAGNOSIS — L80 Vitiligo: Secondary | ICD-10-CM

## 2018-09-18 DIAGNOSIS — E2839 Other primary ovarian failure: Secondary | ICD-10-CM | POA: Diagnosis not present

## 2018-09-18 DIAGNOSIS — H269 Unspecified cataract: Secondary | ICD-10-CM

## 2018-09-18 NOTE — Progress Notes (Signed)
[name] is a 73 y.o. female who presents for annual wellness visit and follow-up on chronic medical conditions.  She has hypothyroidism and is doing well on her present thyroid medication.  She also has bilateral cataracts and is planning on having them removed in the next month or 2.  She has a previous history of osteopenia and presently is on calcium as well as vitamin D.  Her last DEXA scan was several years ago.  Her allergies are under good control.  She is semiretired and does have a boyfriend.  They seem to be getting along quite nicely.  Immunizations and Health Maintenance Immunization History  Administered Date(s) Administered  . DTaP 02/13/2002  . Influenza Split 05/29/2011, 05/12/2012, 04/28/2013  . Influenza, High Dose Seasonal PF 05/12/2014, 04/07/2015, 05/17/2016, 04/11/2017, 04/17/2018  . Pneumococcal Conjugate-13 12/15/2013  . Pneumococcal Polysaccharide-23 02/13/2002  . Zoster 06/30/2007   Health Maintenance Due  Topic Date Due  . TETANUS/TDAP  06/21/1966    Last Pap smear: aged out  Last mammogram:07-24-18 Last colonoscopy: 04-11-18 Last DEXA:08-30-17 Dentist: feb. 2020 Ophtho: feb 2020 Exercise: ymca  Two or three time a week   Other doctors caring for patient include: Dr. Vaughan Browner pulm.   Advanced directives:yes copy asked for Does Patient Have a Medical Advance Directive?: Yes Type of Advance Directive: Healthcare Power of Attorney, Living will Does patient want to make changes to medical advance directive?: No - Patient declined Copy of [place] in Chart?: No - copy requested  Depression screen:  See questionnaire below.  Depression screen [facility] 2/9 09/18/2018 05/17/2016  Decreased Interest 0 0  Down, Depressed, Hopeless 0 0  PHQ - 2 Score 0 0    Fall Risk Screen: see questionnaire below. Fall Risk  09/18/2018 05/17/2016  Falls in the past year? 0 No    ADL screen:  See questionnaire below Functional Status Survey: Is the patient  deaf or have difficulty hearing?: No Does the patient have difficulty seeing, even when wearing glasses/contacts?: No Does the patient have difficulty concentrating, remembering, or making decisions?: No Does the patient have difficulty walking or climbing stairs?: No Does the patient have difficulty dressing or bathing?: No Does the patient have difficulty doing errands alone such as visiting a doctor's office or shopping?: No   Review of Systems Constitutional: -, -unexpected weight change, -anorexia, -fatigue Allergy: -sneezing, -itching, -congestion Dermatology: denies changing moles, rash, lumps ENT: -runny nose, -ear pain, -sore throat,  Cardiology:  -chest pain, -palpitations, -orthopnea, Respiratory: -cough, -shortness of breath, -dyspnea on exertion, -wheezing,  Gastroenterology: -abdominal pain, -nausea, -vomiting, -diarrhea, -constipation, -dysphagia Hematology: -bleeding or bruising problems Musculoskeletal: -arthralgias, -myalgias, -joint swelling, -back pain, - Ophthalmology: -vision changes,  Urology: -dysuria, -difficulty urinating,  -urinary frequency, -urgency, incontinence Neurology: -, -numbness, , -memory loss, -falls, -dizziness    PHYSICAL EXAM:  BP 108/70 (BP Location: Left Arm, Patient Position: Sitting)   Pulse 70   Temp 98.2 F (36.8 C)   Ht 5\' 2"  (1.575 m)   Wt 137 lb 9.6 oz (62.4 kg)   SpO2 96%   BMI 25.17 kg/m   General Appearance: Alert, cooperative, no distress, appears stated age Head: Normocephalic, without obvious abnormality, atraumatic Eyes: PERRL, conjunctiva/corneas clear, EOM's intact, fundi benign Ears: Normal TM's and external ear canals Nose: Nares normal, mucosa normal, no drainage or sinus tenderness Throat: Lips, mucosa, and tongue normal; teeth and gums normal Neck: Supple, no lymphadenopathy;  thyroid:  no enlargement/tenderness/nodules; no carotid bruit or JVD Lungs: Clear  to auscultation bilaterally without wheezes, rales or  ronchi; respirations unlabored Heart: Regular rate and rhythm, S1 and S2 normal, no murmur, rubor gallop Abdomen: Soft, non-tender, nondistended, normoactive bowel sounds,  no masses, no hepatosplenomegaly Extremities: No clubbing, cyanosis or edema Pulses: 2+ and symmetric all extremities Skin:  Skin color, texture, turgor normal,Hypopigmentation is noted. Lymph nodes: Cervical, supraclavicular, and axillary nodes normal Neurologic:  CNII-XII intact, normal strength, sensation and gait; reflexes 2+ and symmetric throughout Psych: Normal mood, affect, hygiene and grooming.  ASSESSMENT/PLAN: Other specified hypothyroidism - Plan: TSH  Estrogen deficiency - Plan: DG Bone Density  Allergic rhinitis due to pollen, unspecified seasonality  Osteopenia, unspecified location  Encounter for long-term (current) use of medications - Plan: CBC with Differential/Platelet, Comprehensive metabolic panel, TSH  Cataract of both eyes, unspecified cataract type   healthy diet, including goals of calcium and vitamin D intake and alcohol recommendations (less than or equal to 1 drink/day) reviewed; regular seatbelt use; changing batteries in smoke detectors.  Immunization recommendations discussed.  Colonoscopy recommendations reviewed   Medicare Attestation I have personally reviewed: The patient's medical and social history Their use of alcohol, tobacco or illicit drugs Their current medications and supplements The patient's functional ability including ADLs,fall risks, home safety risks, cognitive, and hearing and visual impairment Diet and physical activities Evidence for depression or mood disorders  The patient's weight, height, and BMI have been recorded in the chart.  I have made referrals, counseling, and provided education to the patient based on review of the above and I have provided the patient with a written personalized care plan for preventive services.     Jill Alexanders,  MD   09/18/2018

## 2018-09-18 NOTE — Patient Instructions (Signed)
Current Outpatient Medications on File Prior to Visit  Medication Sig Dispense Refill  . aspirin 81 MG tablet Take 81 mg by mouth daily.      . Calcium Citrate (CITRACAL PO) Take 1,000 mg by mouth.    . cetirizine (ZYRTEC) 10 MG tablet Take 10 mg by mouth daily.    Marland Kitchen levothyroxine (SYNTHROID, LEVOTHROID) 100 MCG tablet TAKE 1 TABLET BY MOUTH DAILY 30 tablet 0  . Multiple Vitamins-Minerals (MULTIVITAMIN WITH MINERALS) tablet Take 1 tablet by mouth daily.    Marland Kitchen loratadine (CLARITIN) 10 MG tablet Take 10 mg by mouth daily.     No current facility-administered medications on file prior to visit.

## 2018-09-19 LAB — COMPREHENSIVE METABOLIC PANEL
A/G RATIO: 1.7 (ref 1.2–2.2)
ALT: 19 IU/L (ref 0–32)
AST: 27 IU/L (ref 0–40)
Albumin: 4.5 g/dL (ref 3.7–4.7)
Alkaline Phosphatase: 73 IU/L (ref 39–117)
BUN / CREAT RATIO: 24 (ref 12–28)
BUN: 18 mg/dL (ref 8–27)
Bilirubin Total: 0.5 mg/dL (ref 0.0–1.2)
CO2: 26 mmol/L (ref 20–29)
Calcium: 10 mg/dL (ref 8.7–10.3)
Chloride: 100 mmol/L (ref 96–106)
Creatinine, Ser: 0.76 mg/dL (ref 0.57–1.00)
GFR, EST AFRICAN AMERICAN: 91 mL/min/{1.73_m2} (ref >59)
GFR, EST NON AFRICAN AMERICAN: 79 mL/min/{1.73_m2} (ref >59)
GLOBULIN, TOTAL: 2.7 g/dL (ref 1.5–4.5)
Glucose: 84 mg/dL (ref 65–99)
POTASSIUM: 4.6 mmol/L (ref 3.5–5.2)
SODIUM: 140 mmol/L (ref 134–144)
TOTAL PROTEIN: 7.2 g/dL (ref 6.0–8.5)

## 2018-09-19 LAB — CBC WITH DIFFERENTIAL/PLATELET
BASOS: 1 %
Basophils Absolute: 0.1 10*3/uL (ref 0.0–0.2)
EOS (ABSOLUTE): 0.4 10*3/uL (ref 0.0–0.4)
EOS: 8 %
HEMATOCRIT: 42.7 % (ref 34.0–46.6)
Hemoglobin: 14.7 g/dL (ref 11.1–15.9)
IMMATURE GRANS (ABS): 0 10*3/uL (ref 0.0–0.1)
Immature Granulocytes: 0 %
Lymphocytes Absolute: 1.4 10*3/uL (ref 0.7–3.1)
Lymphs: 29 %
MCH: 31.3 pg (ref 26.6–33.0)
MCHC: 34.4 g/dL (ref 31.5–35.7)
MCV: 91 fL (ref 79–97)
MONOS ABS: 0.5 10*3/uL (ref 0.1–0.9)
Monocytes: 11 %
NEUTROS ABS: 2.5 10*3/uL (ref 1.4–7.0)
NEUTROS PCT: 51 %
Platelets: 240 10*3/uL (ref 150–450)
RBC: 4.69 x10E6/uL (ref 3.77–5.28)
RDW: 13 % (ref 11.7–15.4)
WBC: 4.9 10*3/uL (ref 3.4–10.8)

## 2018-09-19 LAB — TSH: TSH: 3.1 u[IU]/mL (ref 0.450–4.500)

## 2018-09-19 MED ORDER — LEVOTHYROXINE SODIUM 100 MCG PO TABS: 100.0000 ug | ORAL_TABLET | Freq: Every day | ORAL | 3 refills | Status: DC

## 2018-09-19 NOTE — Addendum Note (Signed)
Addended by: Denita Lung on: 09/19/2018 09:34 AM   Modules accepted: Orders

## 2018-09-22 ENCOUNTER — Telehealth: Payer: Self-pay

## 2018-09-22 NOTE — Telephone Encounter (Signed)
Pt was given labs and she requested a copy of them. [place]

## 2018-09-24 DIAGNOSIS — H2511 Age-related nuclear cataract, right eye: Secondary | ICD-10-CM | POA: Diagnosis not present

## 2018-09-24 DIAGNOSIS — H25811 Combined forms of age-related cataract, right eye: Secondary | ICD-10-CM | POA: Diagnosis not present

## 2018-10-14 DIAGNOSIS — M545 Low back pain: Secondary | ICD-10-CM | POA: Diagnosis not present

## 2018-10-28 ENCOUNTER — Encounter: Payer: Self-pay | Admitting: Family Medicine

## 2018-10-28 ENCOUNTER — Other Ambulatory Visit: Payer: Self-pay

## 2018-10-28 ENCOUNTER — Ambulatory Visit: Payer: Medicare Other | Admitting: Family Medicine

## 2018-12-18 DIAGNOSIS — H2512 Age-related nuclear cataract, left eye: Secondary | ICD-10-CM | POA: Diagnosis not present

## 2019-01-13 DIAGNOSIS — M65272 Calcific tendinitis, left ankle and foot: Secondary | ICD-10-CM | POA: Diagnosis not present

## 2019-01-13 DIAGNOSIS — M79669 Pain in unspecified lower leg: Secondary | ICD-10-CM | POA: Diagnosis not present

## 2019-01-13 DIAGNOSIS — I70219 Atherosclerosis of native arteries of extremities with intermittent claudication, unspecified extremity: Secondary | ICD-10-CM | POA: Diagnosis not present

## 2019-01-19 DIAGNOSIS — H2512 Age-related nuclear cataract, left eye: Secondary | ICD-10-CM | POA: Diagnosis not present

## 2019-01-19 DIAGNOSIS — H25812 Combined forms of age-related cataract, left eye: Secondary | ICD-10-CM | POA: Diagnosis not present

## 2019-03-12 ENCOUNTER — Other Ambulatory Visit (INDEPENDENT_AMBULATORY_CARE_PROVIDER_SITE_OTHER): Payer: Medicare Other

## 2019-03-12 ENCOUNTER — Other Ambulatory Visit: Payer: Self-pay

## 2019-03-12 DIAGNOSIS — Z23 Encounter for immunization: Secondary | ICD-10-CM | POA: Diagnosis not present

## 2019-04-02 DIAGNOSIS — Z961 Presence of intraocular lens: Secondary | ICD-10-CM | POA: Diagnosis not present

## 2019-04-09 DIAGNOSIS — L80 Vitiligo: Secondary | ICD-10-CM | POA: Diagnosis not present

## 2019-04-09 DIAGNOSIS — L820 Inflamed seborrheic keratosis: Secondary | ICD-10-CM | POA: Diagnosis not present

## 2019-04-09 DIAGNOSIS — D225 Melanocytic nevi of trunk: Secondary | ICD-10-CM | POA: Diagnosis not present

## 2019-04-09 DIAGNOSIS — L0291 Cutaneous abscess, unspecified: Secondary | ICD-10-CM | POA: Diagnosis not present

## 2019-04-09 DIAGNOSIS — L814 Other melanin hyperpigmentation: Secondary | ICD-10-CM | POA: Diagnosis not present

## 2019-04-09 DIAGNOSIS — L821 Other seborrheic keratosis: Secondary | ICD-10-CM | POA: Diagnosis not present

## 2019-04-20 DIAGNOSIS — H02839 Dermatochalasis of unspecified eye, unspecified eyelid: Secondary | ICD-10-CM | POA: Diagnosis not present

## 2019-04-20 DIAGNOSIS — H02403 Unspecified ptosis of bilateral eyelids: Secondary | ICD-10-CM | POA: Diagnosis not present

## 2019-06-29 DIAGNOSIS — H02403 Unspecified ptosis of bilateral eyelids: Secondary | ICD-10-CM | POA: Diagnosis not present

## 2019-07-01 DIAGNOSIS — Z888 Allergy status to other drugs, medicaments and biological substances status: Secondary | ICD-10-CM | POA: Diagnosis not present

## 2019-07-01 DIAGNOSIS — H02834 Dermatochalasis of left upper eyelid: Secondary | ICD-10-CM | POA: Diagnosis not present

## 2019-07-01 DIAGNOSIS — H02403 Unspecified ptosis of bilateral eyelids: Secondary | ICD-10-CM | POA: Diagnosis not present

## 2019-07-01 DIAGNOSIS — E039 Hypothyroidism, unspecified: Secondary | ICD-10-CM | POA: Diagnosis not present

## 2019-07-01 DIAGNOSIS — Z881 Allergy status to other antibiotic agents status: Secondary | ICD-10-CM | POA: Diagnosis not present

## 2019-07-01 DIAGNOSIS — Z79899 Other long term (current) drug therapy: Secondary | ICD-10-CM | POA: Diagnosis not present

## 2019-07-01 DIAGNOSIS — H02831 Dermatochalasis of right upper eyelid: Secondary | ICD-10-CM | POA: Diagnosis not present

## 2019-07-13 ENCOUNTER — Other Ambulatory Visit: Payer: Self-pay | Admitting: Family Medicine

## 2019-07-13 DIAGNOSIS — Z1231 Encounter for screening mammogram for malignant neoplasm of breast: Secondary | ICD-10-CM

## 2019-07-16 ENCOUNTER — Encounter: Payer: Self-pay | Admitting: Family Medicine

## 2019-07-16 ENCOUNTER — Other Ambulatory Visit: Payer: Self-pay

## 2019-07-16 ENCOUNTER — Ambulatory Visit (INDEPENDENT_AMBULATORY_CARE_PROVIDER_SITE_OTHER): Payer: Medicare Other | Admitting: Family Medicine

## 2019-07-16 VITALS — BP 120/70 | HR 76 | Temp 97.1°F | Ht 62.0 in | Wt 142.6 lb

## 2019-07-16 DIAGNOSIS — L509 Urticaria, unspecified: Secondary | ICD-10-CM | POA: Diagnosis not present

## 2019-07-16 DIAGNOSIS — T783XXA Angioneurotic edema, initial encounter: Secondary | ICD-10-CM

## 2019-07-16 MED ORDER — PREDNISONE 10 MG (21) PO TBPK: ORAL_TABLET | ORAL | 0 refills | Status: DC

## 2019-07-16 MED ORDER — METHYLPREDNISOLONE ACETATE 80 MG/ML IJ SUSP: 80.0000 mg | Freq: Once | INTRAMUSCULAR | Status: DC

## 2019-07-16 MED ORDER — METHYLPREDNISOLONE ACETATE 80 MG/ML IJ SUSP
80.0000 mg | Freq: Once | INTRAMUSCULAR | Status: AC
  Administered 2019-07-16: 12:00:00 80 mg via INTRAMUSCULAR

## 2019-07-16 NOTE — Progress Notes (Addendum)
Chief Complaint  Patient presents with  . Facial Swelling    lip swelling, "whelps" all over her body. Feels like her skin is crawling. Started 2 days ago. Has eyelid surgery 07/06/2019. Using aquaphor cream in place of the erthromycin ointment she was using.    Skin started feeling a little itchy a week ago.  Hives started 2 days ago--all over (arms, wrists, hands, back).   Woke up this morning with swollen lips. Denies any tongue or throat swelling. Denies shortness of breath.  She has been taking benadryl 1 tablet every 4-5 hours, which helps some.  She also takes zyrtec daily, regularly.  She had eyelid surgery 12/21, and thought the antibiotic ointment might be causing rash.  She called [facility], who didn't think it was the cause, but told her to stop it and switch to Aquaphor, which she has done.   She has not had any other new medications. She has a known shrimp allergy, but denies any seafood or new foods.  She does report that she changed laundry detergents, washed clothes earlier in the week with the new detergent.  Since then she threw away the new detergent, got a hypoallergenic one, and has rewashed all of her clothing. She has not used any other new products, soaps, fragrances or other new contacts.  PMH, PSH, SH reviewed.  Outpatient Encounter Medications as of 07/16/2019  Medication Sig Note  . aspirin 81 MG tablet Take 81 mg by mouth daily.     . cetirizine (ZYRTEC) 10 MG tablet Take 10 mg by mouth daily.   . diphenhydrAMINE (BENADRYL) 25 MG tablet Take 25 mg by mouth every 6 (six) hours as needed. 07/16/2019: Taking x 2 days, 1 every 4-5 hours  . HONEY PO Take 15 mLs by mouth daily.   . hydrocortisone cream 1 % Apply 1 application topically as needed for itching.   . levothyroxine (SYNTHROID, LEVOTHROID) 100 MCG tablet Take 1 tablet (100 mcg total) by mouth daily.   . Multiple Vitamins-Minerals ([employer] AIRSHIELD) CHEW Chew 1 each by mouth daily.   . Multiple Vitamins-Minerals  (MULTIVITAMIN WITH MINERALS) tablet Take 1 tablet by mouth daily.   . NON FORMULARY  07/16/2019: Aquaphor cream  . Calcium Citrate (CITRACAL PO) Take 1,000 mg by mouth.   . erythromycin ophthalmic ointment USE A SMALL AMOUNT ON STITCHES FOUR TIMES DAILY FOR 14 DAYS 07/16/2019: Stopped yesterday  . loratadine (CLARITIN) 10 MG tablet Take 10 mg by mouth daily.    No facility-administered encounter medications on file as of 07/16/2019.   Ran out of caltrate just recently.  Allergies  Allergen Reactions  . Levofloxacin    ROS:  No fever, chills, URI symptoms, sore throat, shortness of breath, wheezing, cough.  No nausea, vomiting, bowel changes or other concerns, except as noted in HPI.   PHYSICAL EXAM:  BP 120/70   Pulse 76   Temp (!) 97.1 F (36.2 C) (Other (Comment))   Ht 5\' 2"  (1.575 m)   Wt 142 lb 9.6 oz (64.7 kg)   BMI 26.08 kg/m   Well-appearing, pleasant female, in no distress HEENT: conjunctiva and sclera are clear, EOMI.  She has WHSS on both upper eyelids, healing incisions with no significant erythema, no crusting; ointment is applied. Mask was pulled down to evaluate rest of face--there is swelling of R lower lip and left upper lip.  OP is clear with  Normal tongue and OP with no swelling or erythema. Neck: no lymphadenopathy or mass Heart: regular  rate and rhythm Lungs: clear bilaterally, no wheezing Skin: there are scattered urticaria, small, diffusely on her back, arms, wrists, a few large ones on the forearms. Vitiligo is noted (neck/chest/back) Neuro: alert and oriented, cranial nerves intact, normal gait. Psych: normal mood, affect, hygiene and grooming   ASSESSMENT/PLAN:  Urticaria - Plan: predniSONE (STERAPRED UNI-PAK 21 TAB) 10 MG (21) TBPK tablet, methylPREDNISolone acetate (DEPO-MEDROL) injection 80 mg, methylPREDNISolone acetate (DEPO-MEDROL) injection 80 mg  Angioedema, initial encounter - Plan: predniSONE (STERAPRED UNI-PAK 21 TAB) 10 MG (21) TBPK  tablet, methylPREDNISolone acetate (DEPO-MEDROL) injection 80 mg, methylPREDNISolone acetate (DEPO-MEDROL) injection 80 mg  Risks/SE of steroids reviewed. Given IM injection in office, and to start oral steroids tomorrow. To take zyrtec and pepcid BID, benadryl prn breakthrough hives. F/u if symptoms persist/worsen, to ER if throat/tongue swelling or shortness of breath.  Suspect allergic reaction to the new detergent.   Increase your zyrtec to twice daily. Also get Pepcid 20mg  to take twice daily. You may use the benadryl only if needed for breakthrough hives or lip swelling. Take the steroids as directed (starting tomorrow; shot was given today).

## 2019-07-16 NOTE — Patient Instructions (Signed)
Increase your zyrtec to twice daily. Also get Pepcid 20mg  to take twice daily. You may use the benadryl only if needed for breakthrough hives or lip swelling. Take the steroids as directed (starting tomorrow; shot was given today).   Angioedema  Angioedema is sudden swelling in the body. The swelling can happen in any part of the body. It often happens on the skin and causes itchy, bumpy patches (hives) to form. This condition may:  Happen only one time.  Happen more than one time. It may come back at random times.  Keep coming back for a number of years. Someday it may stop coming back. Follow these instructions at home:  Take over-the-counter and prescription medicines only as told by your doctor.  If you were given medicines for emergency allergy treatment, always carry them with you.  Wear a medical bracelet as told by your doctor.  Avoid the things that cause your attacks (triggers).  If this condition was passed to you from your parents and you want to have kids, talk to your doctor. Your kids may also have this condition. Contact a doctor if:  You have another attack.  Your attacks happen more often, even after you take steps to prevent them.  This condition was passed to you by your parents and you want to have kids. Get help right away if:  Your mouth, tongue, or lips get very swollen.  You have trouble breathing.  You have trouble swallowing.  You pass out (faint). This information is not intended to replace advice given to you by your health care provider. Make sure you discuss any questions you have with your health care provider. Document Revised: 06/14/2017 Document Reviewed: 01/10/2016 Elsevier Patient Education  [place] are itchy, red, swollen areas on your skin. Hives can show up on any part of your body. Hives often fade within 24 hours (acute hives). New hives can show up after old ones fade. This can go on for many  days or weeks (chronic hives). Hives do not spread from person to person (are not contagious). Hives are caused by your body's response to something that you are allergic to (allergen). These are sometimes called triggers. You can get hives right after being around a trigger, or hours later. What are the causes?  Allergies to foods.  Insect bites or stings.  Pollen.  Pets.  Latex.  Chemicals.  Spending time in sunlight, heat, or cold.  Exercise.  Stress.  Some medicines.  Viruses. This includes the common cold.  Infections caused by germs (bacteria).  Allergy shots.  Blood transfusions. Sometimes, the cause is not known. What increases the risk?  Being a woman.  Being allergic to foods such as: ? Citrus fruits. ? Milk. ? Eggs. ? Peanuts. ? Tree nuts. ? Shellfish.  Being allergic to: ? Medicines. ? Latex. ? Insects. ? Animals. ? Pollen. What are the signs or symptoms?   Raised, itchy, red or white bumps or patches on your skin. These areas may: ? Get large and swollen. ? Change in shape and location. ? Stand alone or connect to each other over a large area of skin. ? Sting or hurt. ? Turn white when pressed in the center (blanch). In very bad cases, your hands, feet, and face may also get swollen. This may happen if hives start deeper in your skin. How is this treated? Treatment for this condition depends on your symptoms. Treatment may include:  Using cool,  wet cloths (cool compresses) or taking cool showers to stop the itching.  Medicines that help: ? Relieve itching (antihistamines). ? Reduce swelling (corticosteroids). ? Treat infection (antibiotics).  A medicine (omalizumab) that is given as a shot (injection). Your doctor may prescribe this if you have hives that do not get better even after other treatments.  In very bad cases, you may need a shot of a medicine called epinephrineto prevent a life-threatening allergic reaction  (anaphylaxis). Follow these instructions at home: Medicines  Take or apply over-the-counter and prescription medicines only as told by your doctor.  If you were prescribed an antibiotic medicine, use it as told by your doctor. Do not stop using it even if you start to feel better. Skin care  Apply cool, wet cloths to the hives.  Do not scratch your skin. Do not rub your skin. General instructions  Do not take hot showers or baths. This can make itching worse.  Do not wear tight clothes.  Use sunscreen and wear clothes that cover your skin when you are outside.  Avoid any triggers that cause your hives. Keep a journal to help track what causes your hives. Write down: ? What medicines you take. ? What you eat and drink. ? What products you use on your skin.  Keep all follow-up visits as told by your doctor. This is important. Contact a doctor if:  Your symptoms are not better with medicine.  Your joints hurt or are swollen. Get help right away if:  You have a fever.  You have pain in your belly (abdomen).  Your tongue or lips are swollen.  Your eyelids are swollen.  Your chest or throat feels tight.  You have trouble breathing or swallowing. These symptoms may be an emergency. Do not wait to see if the symptoms will go away. Get medical help right away. Call your local emergency services (911 in the [place] Do not drive yourself to the hospital. Summary  Hives are itchy, red, swollen areas on your skin.  Treatment for this condition depends on your symptoms.  Avoid things that cause your hives. Keep a journal to help track what causes your hives.  Take and apply over-the-counter and prescription medicines only as told by your doctor.  Keep all follow-up visits as told by your doctor. This is important. This information is not intended to replace advice given to you by your health care provider. Make sure you discuss any questions you have with your health care  provider. Document Revised: 01/15/2018 Document Reviewed: 01/15/2018 Elsevier Patient Education  [place].

## 2019-07-31 ENCOUNTER — Other Ambulatory Visit: Payer: Self-pay | Admitting: Family Medicine

## 2019-07-31 DIAGNOSIS — E2839 Other primary ovarian failure: Secondary | ICD-10-CM

## 2019-08-17 ENCOUNTER — Encounter: Payer: Self-pay | Admitting: Family Medicine

## 2019-08-17 ENCOUNTER — Ambulatory Visit (INDEPENDENT_AMBULATORY_CARE_PROVIDER_SITE_OTHER): Payer: Medicare Other | Admitting: Family Medicine

## 2019-08-17 ENCOUNTER — Other Ambulatory Visit: Payer: Self-pay

## 2019-08-17 VITALS — BP 130/70 | HR 60 | Temp 97.3°F | Ht 62.0 in | Wt 141.0 lb

## 2019-08-17 DIAGNOSIS — Z7185 Encounter for immunization safety counseling: Secondary | ICD-10-CM

## 2019-08-17 DIAGNOSIS — Z7189 Other specified counseling: Secondary | ICD-10-CM | POA: Diagnosis not present

## 2019-08-17 DIAGNOSIS — L509 Urticaria, unspecified: Secondary | ICD-10-CM | POA: Diagnosis not present

## 2019-08-17 DIAGNOSIS — R220 Localized swelling, mass and lump, head: Secondary | ICD-10-CM | POA: Diagnosis not present

## 2019-08-17 DIAGNOSIS — Z91013 Allergy to seafood: Secondary | ICD-10-CM

## 2019-08-17 MED ORDER — PREDNISONE 10 MG (21) PO TBPK: ORAL_TABLET | ORAL | 0 refills | Status: DC

## 2019-08-17 MED ORDER — EPINEPHRINE 0.3 MG/0.3ML IJ SOAJ: 0.3000 mg | INTRAMUSCULAR | 1 refills | Status: DC | PRN

## 2019-08-17 NOTE — Progress Notes (Signed)
Chief Complaint  Patient presents with  . Urticaria    started Sat. Lips, arms and back.    Yesterday she felt like she had the "crawlies" on her wrist, back.  She did notice some redness/rash on her wrist.  This morning she saw welps on her back, and her lower abdomen.  She has a known shellfish allergy.  She cannot recall her symptoms, recalls "choking", but recalls being told she could "choke to death" if she ate shrimp.  She reports that Saturday night she ate [place] (hibachi), where shrimp was served. She didn't eat any shrimp that she is aware of. Has eaten there in the past without problems, but not in many months.  Friday night went to [employer] and had fried flounder sandwich.  This morning she woke up and hr right upper lip was swollen, lower lip is tingling, feels like it might swell.  She takes zyrtec every morning, and reports that her lip swelling has improved since she took the zyrtec, better than it was when she woke up.  No swelling of tongue, throat, shortness of breath or wheezing.  Seen 12/31 with hives and angioedema, treated with DepoMedrol IM and a course of prednisone.  At that time she was thinking it could have been a new laundry detergent, vs eye medication.  She continues to use hypoallergenic detergent that she had switched back to.  Denies any new products.  She has many questions regarding the COVID vaccine.   PMH, PSH, SH reviewed  Current Outpatient Medications on File Prior to Visit  Medication Sig Dispense Refill  . aspirin 81 MG tablet Take 81 mg by mouth daily.      . Calcium Citrate (CITRACAL PO) Take 1,000 mg by mouth.    . cetirizine (ZYRTEC) 10 MG tablet Take 10 mg by mouth daily.    . HONEY PO Take 15 mLs by mouth daily.    . hydrocortisone cream 1 % Apply 1 application topically as needed for itching.    . levothyroxine (SYNTHROID, LEVOTHROID) 100 MCG tablet Take 1 tablet (100 mcg total) by mouth daily. 90 tablet 3  . mineral oil-hydrophilic  petrolatum (AQUAPHOR) ointment Apply 1 application topically as needed for dry skin.    . Multiple Vitamins-Minerals (MULTIVITAMIN WITH MINERALS) tablet Take 1 tablet by mouth daily.    . Probiotic Product (PROBIOTIC DAILY PO) Take 2 each by mouth daily.    . diphenhydrAMINE (BENADRYL) 25 MG tablet Take 25 mg by mouth every 6 (six) hours as needed.     No current facility-administered medications on file prior to visit.    Allergies  Allergen Reactions  . Levofloxacin   . Shellfish Allergy     ROS: no fever, chills, URI symptoms, cough, shortness of breath.  +hives and lip swelling per HPI. No other rashes, no bleeding, no dizziness or other complaints   PHYSICAL EXAM:  BP 130/70   Pulse 60   Temp (!) 97.3 F (36.3 C) (Other (Comment))   Ht 5\' 2"  (1.575 m)   Wt 141 lb (64 kg)   BMI 25.79 kg/m   Well appearing, pleasant female, in good spirits.  She is in no distress. She is speaking comfortably. She is alert and oriented  Skin: There are urticarial lesions noted at her wrists, and a few scattered ones on her back. Minimal swelling of lips (small area on left lower lip seems smoother, indicating very mild swelling). OP is clear.  No swelling of tongue or OP, normal  mucosa. Lungs clear bilaterally, no wheezes or ronchi Heart: regular rate and rhythm Cranial nerves are intact, normal gait.   ASSESSMENT/PLAN:  Shellfish allergy - counseled regarding avoidance, poss for cross-contamination, and to be more cautious when eating out. Counseled re: use of epi-pen (when/how to use, etc) - Plan: predniSONE (STERAPRED UNI-PAK 21 TAB) 10 MG (21) TBPK tablet, EPINEPHrine 0.3 mg/0.3 mL IJ SOAJ injection  Urticaria - Hives noted today; appears mild--trial increasing zyrtec to BID, along with pepcid BID and benadryl prn. Add steroids only if not responding to these measures - Plan: predniSONE (STERAPRED UNI-PAK 21 TAB) 10 MG (21) TBPK tablet  Lip swelling - minimal, responded to the zyrtec  she took this morning.  Counseled re: medications, and when/how to start steroids vs epi-pen, if needed  Vaccine counseling - counseled re: COVID vaccine, risks, side effects, timing. Encouraged her to schedule appt (to wait at least 2-4 weeks after steroid course, if needs to take it)  Counseled about COVID-19 virus infection - discussed prevention, and measures to follow even after being vaccinated  Consider referral to allergist if continues to have allergic reactions/hives.  All questions answered regarding COVID and the vaccine.   Take your zyrtec twice daily. Also start taking famotidine (pepcid) 20mg  twice daily. If needed, you may use benadryl in addition to these medications, for hives (if needed). If you continue to have hives, especially if worsening, despite taking the zyrtec and pepcid twice daily, then you may start the course of steroids. (we sent it to your pharmacy, to let you have it to keep on hand, just in case).  If steroids are needed, you can continue the zyrtec and pepcid.  Avoid eating at hibachi places unless they can cook separately (on a separate wok), to avoid any cross contamination. Avoid buffets--it is best to order off menus and let the chef know your food allergy. When you first notice any symptoms, take a benadryl.  And then bump up your zyrtec to twice daily, and add the pepcid.  If you notice progression of your allergic reaction to have tongue or throat swelling, or any trouble breathing, then you will need to use your epipen and go to the hospital. Hopefully being very cautious with your eating, and having benadryl on hand to use right away will avoid the need for using the epi-pen.   30 min [facility] visit, more than 1/2 spent counseling, plus 5 mins chart review and documentation.

## 2019-08-17 NOTE — Patient Instructions (Addendum)
Seafood Allergy A seafood allergy is an abnormal reaction to fish or shellfish by the body's defense system (immune system). If you are allergic to seafood, your body reacts to fish or shellfish as if it is a dangerous substance. In some cases, a seafood allergy can cause a severe, life-threatening reaction that may make it hard to breathe (anaphylaxis). A shellfish allergy is one of the most common types of allergy. Shellfish includes crab, lobster, and shrimp. A shellfish allergy often does not start until the person is an adult. It is less common to have an allergy to finned fish, such as tuna, halibut, or salmon. Often, a fish allergy also does not start until the person is an adult. What are the causes? A seafood allergy happens when the immune system sees fish or shellfish as harmful and releases chemicals (antibodies) to fight it. What are the signs or symptoms? Symptoms of this condition include:  Itching or tingling in your mouth.  Coughing.  Nasal congestion.  Sneezing.  Nausea and vomiting.  Diarrhea.  Headaches. In people with a severe allergy, a life-threatening reaction can occur called anaphylaxis. Get help right away if you have symptoms of anaphylaxis, such as:  Feeling warm in the face (flushed). This may include redness.  Itchy, red, swollen areas of skin (hives).  Swelling of the eyes, lips, face, mouth, tongue, or throat.  Difficulty breathing, speaking, or swallowing.  Noisy breathing (wheezing).  Dizziness or light-headedness.  Fainting.  Pain or cramping in the abdomen. How is this diagnosed? This condition may be diagnosed based on:  A physical exam.  Your medical history.  Blood tests.  A skin prick test. For this test, a small amount of a liquid containing an allergy-causing substance is put on your arm.  A food challenge test. This test involves eating the food that may be causing the allergic response while being monitored for a reaction  by your health care provider. How is this treated?  There is no cure for a seafood allergy. Treatment focuses on preventing exposure to the fish or shellfish you are allergic to and treating reactions if you are exposed to the food. Mild symptoms may not need treatment. Severe reactions usually need to be treated at a hospital. Treatment may include:  Medicines that help: ? Tighten your blood vessels (epinephrine). ? Relieve itching and hives (antihistamines). ? Widen the narrow and tight airways (bronchodilators). ? Reduce swelling (corticosteroids).  Oxygen therapy to help you breathe.  IV fluids to keep you hydrated. After a severe reaction, you may be given rescue medicines, such as:  An anaphylaxis kit.  An epinephrine injection, commonly called an auto-injector "pen" (pre-filled automatic epinephrine injection device). Your health care provider may teach you how to use these if you are accidentally exposed to an allergen. Follow these instructions at home: Eating and drinking  Do not eat the shellfish or fish that you are allergic to.  Read food labels carefully. Fish and shellfish can be ingredients in sauces, broths, and other products.  When you eat out, let your server know that you have a seafood allergy. Ask how foods are prepared. General instructions  Take over-the-counter and prescription medicines only as told by your health care provider.  Wear a medical alert bracelet or necklace that describes your allergy.  Carry your anaphylaxis kit or an auto-injector pen with you at all times. Use them as told by your health care provider.  Make sure that you, your family members, and your employer   know: ? The signs of anaphylaxis. ? How to use an anaphylaxis kit. ? How to use an auto-injector pen.  If you think that you are having an anaphylactic reaction, use your auto-injector pen or anaphylaxis kit.  Replace your auto-injector pen immediately after use in case  you have another reaction.  Get medical care after you use your auto-injector pen. This is important because you can have a delayed, life-threatening reaction after taking the medicine (rebound anaphylaxis).  Inform all health care providers that you have a seafood allergy.  Keep all follow-up visits as told by your health care provider. This is important. Contact a health care provider if you:  Have symptoms that do not go away within 2 days.  Have symptoms that get worse.  Have new symptoms. Get help right away if you have symptoms of anaphylaxis:  Flushed skin.  Hives.  Swelling of the eyes, lips, face, mouth, tongue, or throat.  Difficulty breathing, speaking, or swallowing.  Wheezing.  Dizziness or light-headedness.  Fainting.  Pain or cramping in the abdomen. These symptoms may represent a serious problem that is an emergency. Do not wait to see if the symptoms will go away. Use your auto-injector pen or anaphylaxis kit as you have been told. Get medical help right away. Call your local emergency services (911 in the [place] Do not drive yourself to the hospital. If you needed to use an auto-injector pen, you need more medical care even if the medicine seems to be helping. This is important because anaphylaxis may happen again within 72 hours. Summary  A seafood allergy is when your body reacts to fish or shellfish as though they are dangerous substances.  In some cases, a fish or shellfish allergy can cause a severe, life-threatening reaction that may make it hard to breathe (anaphylaxis).  There is no cure for food allergies. Treatment focuses on preventing exposure to the food or foods you are allergic to and treating reactions if you are exposed to the food.  Wear a medical alert bracelet or necklace that describes your allergy.  Make sure you understand your emergency treatment plan and know how to use the auto-injector pen. This information is not intended to  replace advice given to you by your health care provider. Make sure you discuss any questions you have with your health care provider. Document Revised: 07/18/2017 Document Reviewed: 07/18/2017 Elsevier Patient Education  [place] a benadryl once you get home. Take your zyrtec twice daily. Also start taking famotidine (pepcid) '20mg'$  twice daily. If you continue to have hives, especially if worsening, despite taking the zyrtec and pepcid twice daily, then you may start the course of steroids. (we sent it to your pharmacy, to let you have it to keep on hand, just in case).  If steroids are needed, you can continue the zyrtec and pepcid.  We sent in a prescription for epi-pen to have on hand--use this if you develop tongue or throat swelling, any wheezing or shortness of breath.  Make sure you go to the hospital if you need to use this epi-pen.  Avoid eating at hibachi places unless they can cook separately (on a separate wok), to avoid any cross contamination. Avoid buffets--it is best to order off menus and let the chef know your food allergy. When you first notice any symptoms, take a benadryl.  And then bump up your zyrtec to twice daily, and add the pepcid.  If you notice progression of your allergic  reaction to have tongue or throat swelling, or any trouble breathing, then you will need to use your epipen and go to the hospital. Hopefully being very cautious with your eating, and having benadryl on hand to use right away will avoid the need for using the epi-pen.   Check [email or web address] or [place] DHHS websites, or [facility]'s website.  COVID-19 Vaccine Information can be found at: ShippingScam.co.uk For questions related to vaccine distribution or appointments, please email [email or web address] or call [phone].

## 2019-08-25 ENCOUNTER — Ambulatory Visit: Payer: Medicare Other

## 2019-09-07 ENCOUNTER — Other Ambulatory Visit: Payer: Self-pay

## 2019-09-07 DIAGNOSIS — R911 Solitary pulmonary nodule: Secondary | ICD-10-CM

## 2019-09-21 ENCOUNTER — Ambulatory Visit (INDEPENDENT_AMBULATORY_CARE_PROVIDER_SITE_OTHER): Payer: Medicare Other | Admitting: Family Medicine

## 2019-09-21 ENCOUNTER — Other Ambulatory Visit: Payer: Self-pay

## 2019-09-21 ENCOUNTER — Encounter: Payer: Self-pay | Admitting: Family Medicine

## 2019-09-21 VITALS — BP 134/86 | HR 67 | Temp 97.3°F | Ht 62.0 in | Wt 143.2 lb

## 2019-09-21 DIAGNOSIS — E2839 Other primary ovarian failure: Secondary | ICD-10-CM | POA: Diagnosis not present

## 2019-09-21 DIAGNOSIS — J301 Allergic rhinitis due to pollen: Secondary | ICD-10-CM | POA: Diagnosis not present

## 2019-09-21 DIAGNOSIS — E038 Other specified hypothyroidism: Secondary | ICD-10-CM | POA: Diagnosis not present

## 2019-09-21 DIAGNOSIS — Z0000 Encounter for general adult medical examination without abnormal findings: Secondary | ICD-10-CM | POA: Diagnosis not present

## 2019-09-21 DIAGNOSIS — M8580 Other specified disorders of bone density and structure, unspecified site: Secondary | ICD-10-CM

## 2019-09-21 NOTE — Progress Notes (Signed)
[name] is a 74 y.o. female who presents for annual wellness visit and follow-up on chronic medical conditions.  She has a history of hypothyroidism and is presently on thyroid replacement having no difficulty.  She also has a history of osteopenia and is in line to have another DEXA scan.  Review of her record indicates her motion abnormal chest CT showing a 77mm lesion.  She needs follow-up on this.  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  She has no other concerns or complaints.  She is now retired and making lifestyle changes in regard to that. Immunizations and Health Maintenance Immunization History  Administered Date(s) Administered  . DTaP 02/13/2002  . Fluad Quad(high Dose 65+) 03/12/2019  . Influenza Split 05/29/2011, 05/12/2012, 04/28/2013  . Influenza, High Dose Seasonal PF 05/12/2014, 04/07/2015, 05/17/2016, 04/11/2017, 04/17/2018  . Pneumococcal Conjugate-13 12/15/2013  . Pneumococcal Polysaccharide-23 02/13/2002  . Zoster 06/30/2007  . Zoster Recombinat (Shingrix) 05/01/2018, 09/04/2018   Health Maintenance Due  Topic Date Due  . TETANUS/TDAP  06/21/1966    Last Pap smear:aged out  Last mammogram: 07/24/18 Last colonoscopy:04/11/18 Last DEXA:08/30/17 Dentist: Q six months Ophtho: 07/01/19 Exercise: not much   Other doctors caring for patient include: N/A  Advanced directives: not on file information asked for Does Patient Have a Medical Advance Directive?: Yes Type of Advance Directive: Healthcare Power of Attorney, Living will Does patient want to make changes to medical advance directive?: No - Patient declined Copy of [place] in Chart?: No - copy requested  Depression screen:  See questionnaire below.  Depression screen [facility] 2/9 09/21/2019 09/18/2018 05/17/2016  Decreased Interest 0 0 0  Down, Depressed, Hopeless 0 0 0  PHQ - 2 Score 0 0 0    Fall Risk Screen: see questionnaire below. Fall Risk  09/21/2019 09/18/2018 05/17/2016  Falls in the past  year? 0 0 No    ADL screen:  See questionnaire below Functional Status Survey: Is the patient deaf or have difficulty hearing?: No Does the patient have difficulty seeing, even when wearing glasses/contacts?: No Does the patient have difficulty concentrating, remembering, or making decisions?: No Does the patient have difficulty walking or climbing stairs?: No Does the patient have difficulty dressing or bathing?: No Does the patient have difficulty doing errands alone such as visiting a doctor's office or shopping?: No   Review of Systems Constitutional: -, -unexpected weight change, -anorexia, -fatigue Allergy: -sneezing, -itching, -congestion Dermatology: denies changing moles, rash, lumps ENT: -runny nose, -ear pain, -sore throat,  Cardiology:  -chest pain, -palpitations, -orthopnea, Respiratory: -cough, -shortness of breath, -dyspnea on exertion, -wheezing,  Gastroenterology: -abdominal pain, -nausea, -vomiting, -diarrhea, -constipation, -dysphagia Hematology: -bleeding or bruising problems Musculoskeletal: -arthralgias, -myalgias, -joint swelling, -back pain, - Ophthalmology: -vision changes,  Urology: -dysuria, -difficulty urinating,  -urinary frequency, -urgency, incontinence Neurology: -, -numbness, , -memory loss, -falls, -dizziness    PHYSICAL EXAM:  BP 134/86 (BP Location: Left Arm, Patient Position: Sitting)   Pulse 67   Temp (!) 97.3 F (36.3 C)   Ht 5\' 2"  (1.575 m)   Wt 143 lb 3.2 oz (65 kg)   SpO2 98%   BMI 26.19 kg/m   General Appearance: Alert, cooperative, no distress, appears stated age Head: Normocephalic, without obvious abnormality, atraumatic Eyes: PERRL, conjunctiva/corneas clear, EOM's intact, Ears: Normal TM's and external ear canals Nose: Nares normal, mucosa normal, no drainage or sinus tenderness Throat: Lips, mucosa, and tongue normal; teeth and gums normal Neck: Supple, no lymphadenopathy;  thyroid:  no enlargement/tenderness/nodules; no  carotid bruit or JVD Lungs: Clear to auscultation bilaterally without wheezes, rales or ronchi; respirations unlabored Heart: Regular rate and rhythm, S1 and S2 normal, no murmur, rubor gallop Abdomen: Soft, non-tender, nondistended, normoactive bowel sounds,  no masses, no hepatosplenomegaly Skin:  Skin color, texture, turgor normal, no rashes or lesions Lymph nodes: Cervical, supraclavicular, and axillary nodes normal Neurologic:  CNII-XII intact, normal strength, sensation and gait; reflexes 2+ and symmetric throughout Psych: Normal mood, affect, hygiene and grooming.  ASSESSMENT/PLAN: Routine general medical examination at a health care facility - Plan: CBC with Differential, Comprehensive metabolic panel, Lipid Panel  Osteopenia, unspecified location  Other specified hypothyroidism - Plan: TSH  Allergic rhinitis due to pollen, unspecified seasonality  Estrogen deficiency - Plan: DG Bone Density     ; at least 30 minutes of aerobic activity at least 5 days/week and weight-bearing exercise 2x/week; ; healthy diet, including goals of calcium and vitamin D intake and alcohol recommendations (less than or equal to 1 drink/day)  Immunization recommendations discussed.  Colonoscopy recommendations reviewed   Medicare Attestation I have personally reviewed: The patient's medical and social history Their use of alcohol, tobacco or illicit drugs Their current medications and supplements The patient's functional ability including ADLs,fall risks, home safety risks, cognitive, and hearing and visual impairment Diet and physical activities Evidence for depression or mood disorders  The patient's weight, height, and BMI have been recorded in the chart.  I have made referrals, counseling, and provided education to the patient based on review of the above and I have provided the patient with a written personalized care plan for preventive services.

## 2019-09-21 NOTE — Patient Instructions (Signed)
  [name] , Thank you for taking time to come for your Medicare Wellness Visit. I appreciate your ongoing commitment to your health goals. Please review the following plan we discussed and let me know if I can assist you in the future.   These are the goals we discussed: 20 minutes of something physical daily or 150 minutes a week of something.  Encourage her eating habits especially in regard to carbohydrates.  Look at bread, pasta, potatoes, sugar,rice This is a list of the screening recommended for you and due dates:  Health Maintenance  Topic Date Due  . Tetanus Vaccine  06/21/1966  . Mammogram  07/24/2020  . Colon Cancer Screening  04/11/2028  . Flu Shot  Completed  . DEXA scan (bone density measurement)  Completed  .  Hepatitis C: One time screening is recommended by Center for Disease Control  (CDC) for  adults born from [birth year] through 1964.   Completed  . Pneumonia vaccines  Discontinued

## 2019-09-22 LAB — CBC WITH DIFFERENTIAL/PLATELET
Basophils Absolute: 0 10*3/uL (ref 0.0–0.2)
Basos: 0 %
EOS (ABSOLUTE): 0.2 10*3/uL (ref 0.0–0.4)
Eos: 4 %
Hematocrit: 43.2 % (ref 34.0–46.6)
Hemoglobin: 14.4 g/dL (ref 11.1–15.9)
Immature Grans (Abs): 0 10*3/uL (ref 0.0–0.1)
Immature Granulocytes: 0 %
Lymphocytes Absolute: 1.6 10*3/uL (ref 0.7–3.1)
Lymphs: 26 %
MCH: 31.8 pg (ref 26.6–33.0)
MCHC: 33.3 g/dL (ref 31.5–35.7)
MCV: 95 fL (ref 79–97)
Monocytes Absolute: 0.6 10*3/uL (ref 0.1–0.9)
Monocytes: 9 %
Neutrophils Absolute: 3.7 10*3/uL (ref 1.4–7.0)
Neutrophils: 61 %
Platelets: 233 10*3/uL (ref 150–450)
RBC: 4.53 x10E6/uL (ref 3.77–5.28)
RDW: 13.3 % (ref 11.7–15.4)
WBC: 6.1 10*3/uL (ref 3.4–10.8)

## 2019-09-22 LAB — COMPREHENSIVE METABOLIC PANEL
ALT: 15 IU/L (ref 0–32)
AST: 21 IU/L (ref 0–40)
Albumin/Globulin Ratio: 1.7 (ref 1.2–2.2)
Albumin: 4.3 g/dL (ref 3.7–4.7)
Alkaline Phosphatase: 82 IU/L (ref 39–117)
BUN/Creatinine Ratio: 28 (ref 12–28)
BUN: 20 mg/dL (ref 8–27)
Bilirubin Total: 0.4 mg/dL (ref 0.0–1.2)
CO2: 21 mmol/L (ref 20–29)
Calcium: 9.9 mg/dL (ref 8.7–10.3)
Chloride: 103 mmol/L (ref 96–106)
Creatinine, Ser: 0.72 mg/dL (ref 0.57–1.00)
GFR calc Af Amer: 97 mL/min/{1.73_m2} (ref >59)
GFR calc non Af Amer: 84 mL/min/{1.73_m2} (ref >59)
Globulin, Total: 2.5 g/dL (ref 1.5–4.5)
Glucose: 93 mg/dL (ref 65–99)
Potassium: 5 mmol/L (ref 3.5–5.2)
Sodium: 141 mmol/L (ref 134–144)
Total Protein: 6.8 g/dL (ref 6.0–8.5)

## 2019-09-22 LAB — LIPID PANEL
Chol/HDL Ratio: 2.6 ratio (ref 0.0–4.4)
Cholesterol, Total: 212 mg/dL — ABNORMAL HIGH (ref 100–199)
HDL: 83 mg/dL (ref >39)
LDL Chol Calc (NIH): 118 mg/dL — ABNORMAL HIGH (ref 0–99)
Triglycerides: 63 mg/dL (ref 0–149)
VLDL Cholesterol Cal: 11 mg/dL (ref 5–40)

## 2019-09-22 LAB — TSH: TSH: 1.38 u[IU]/mL (ref 0.450–4.500)

## 2019-09-23 NOTE — Progress Notes (Signed)
Letter was mailed with lab results per pt request . [facility]

## 2019-09-24 ENCOUNTER — Telehealth: Payer: Self-pay

## 2019-09-24 NOTE — Telephone Encounter (Signed)
Pt. Called stating that she got a covid vaccine two days ago and thinks she is having a allergic reaction to it, the lt. Side of her lip is swollen. She wasn't sure if she need to see or not or if she just needed recommendations.

## 2019-10-02 ENCOUNTER — Encounter: Payer: Self-pay | Admitting: Family Medicine

## 2019-10-02 ENCOUNTER — Ambulatory Visit (INDEPENDENT_AMBULATORY_CARE_PROVIDER_SITE_OTHER): Payer: Medicare Other | Admitting: Family Medicine

## 2019-10-02 ENCOUNTER — Telehealth: Payer: Self-pay | Admitting: Family Medicine

## 2019-10-02 ENCOUNTER — Other Ambulatory Visit: Payer: Self-pay

## 2019-10-02 VITALS — BP 150/78 | HR 80 | Temp 97.5°F | Wt 141.4 lb

## 2019-10-02 DIAGNOSIS — T7840XA Allergy, unspecified, initial encounter: Secondary | ICD-10-CM

## 2019-10-02 NOTE — Telephone Encounter (Signed)
I put an order in for that

## 2019-10-02 NOTE — Progress Notes (Signed)
   Subjective:    Patient ID: [name], female    DOB: 1946-07-04, 74 y.o.   MRN: [id number]  HPI She has had difficulty over the last week with initially upper lip swelling and now lower lip swelling as well as minor wheal-and-flare reactions on her body that tend to bother her more at night.  During the day she does not have much trouble.  She has had no new soaps, detergents, colognes, medications.  No history of being bitten by a tick or having fever, chills, sore throat, cough or congestion.   Review of Systems     Objective:   Physical Exam Alert and in no distress.  Exam of her skin does show very mild wheal and flare type reaction scattered on her body.  She is not an extremis. Normal breathing pattern.  Recent blood work was evaluated.      Assessment & Plan:  Allergic reaction, initial encounter - Plan: Ambulatory referral to Allergy Recommend she continue on an antihistamine and also use Benadryl 25 mg 3 times daily as needed.  Refer to allergist.  Cautioned that if she gets worse, call or go to the emergency room.  She was comfortable with that.

## 2019-10-02 NOTE — Telephone Encounter (Signed)
Pt called and wanted to see if you were going to give her a referral to an allergy specialist

## 2019-10-02 NOTE — Patient Instructions (Signed)
Take benadryl as needed for itching 25 mg pill No antihistamines before you see the allergist

## 2019-10-05 ENCOUNTER — Other Ambulatory Visit: Payer: Self-pay

## 2019-10-05 ENCOUNTER — Encounter: Payer: Self-pay | Admitting: Family Medicine

## 2019-10-05 ENCOUNTER — Ambulatory Visit (INDEPENDENT_AMBULATORY_CARE_PROVIDER_SITE_OTHER): Payer: Medicare Other | Admitting: Family Medicine

## 2019-10-05 VITALS — BP 160/90 | HR 76 | Temp 97.8°F | Wt 143.0 lb

## 2019-10-05 DIAGNOSIS — T7840XA Allergy, unspecified, initial encounter: Secondary | ICD-10-CM

## 2019-10-05 MED ORDER — METHYLPREDNISOLONE SODIUM SUCC 125 MG IJ SOLR
125.0000 mg | Freq: Once | INTRAMUSCULAR | Status: AC
  Administered 2019-10-05: 125 mg via INTRAMUSCULAR

## 2019-10-05 NOTE — Patient Instructions (Signed)
Take Benadryl 3 times per day for itching.

## 2019-10-05 NOTE — Addendum Note (Signed)
Addended by: Elyse Jarvis on: 10/05/2019 12:09 PM   Modules accepted: Orders

## 2019-10-05 NOTE — Progress Notes (Signed)
   Subjective:    Patient ID: [name], female    DOB: [birth year], 74 y.o.   MRN: [id number]  HPI She is here for recheck.  The allergic reaction has gotten worse with more wheal-and-flare and itching.  She again cannot relate this to any particular foods, colognes, detergents.  She notes no change with any particular foods.  She has had no fever, chills, cough, congestion, weight change.   Review of Systems     Objective:   Physical Exam Alert and feeling uncomfortable.  Wheal-and-flare reaction is more noticeable.  Pictures were taken on her phone. Lungs are clear to auscultation. SEM 1/6 noted.  Abdominal exam shows no masses or tenderness      Assessment & Plan:  Allergic reaction, initial encounter She will be given a shot of Depo-Medrol and we will expedite getting her seen by allergist.  I will hold off on any oral steroids for right now if I can.  She is to make sure she takes Benadryl.

## 2019-10-06 ENCOUNTER — Encounter (HOSPITAL_COMMUNITY): Payer: Self-pay | Admitting: Emergency Medicine

## 2019-10-06 ENCOUNTER — Emergency Department (HOSPITAL_COMMUNITY)
Admission: EM | Admit: 2019-10-06 | Discharge: 2019-10-07 | Disposition: A | Discharge status: Home / Self Care | Payer: Medicare Other | Attending: Emergency Medicine | Admitting: Emergency Medicine

## 2019-10-06 ENCOUNTER — Other Ambulatory Visit: Payer: Self-pay

## 2019-10-06 DIAGNOSIS — T7840XA Allergy, unspecified, initial encounter: Secondary | ICD-10-CM | POA: Diagnosis not present

## 2019-10-06 DIAGNOSIS — Z9049 Acquired absence of other specified parts of digestive tract: Secondary | ICD-10-CM | POA: Diagnosis not present

## 2019-10-06 DIAGNOSIS — R220 Localized swelling, mass and lump, head: Secondary | ICD-10-CM | POA: Diagnosis present

## 2019-10-06 DIAGNOSIS — Z79899 Other long term (current) drug therapy: Secondary | ICD-10-CM | POA: Insufficient documentation

## 2019-10-06 DIAGNOSIS — E039 Hypothyroidism, unspecified: Secondary | ICD-10-CM | POA: Insufficient documentation

## 2019-10-06 DIAGNOSIS — Z7982 Long term (current) use of aspirin: Secondary | ICD-10-CM | POA: Insufficient documentation

## 2019-10-06 MED ORDER — FAMOTIDINE IN NACL 20-0.9 MG/50ML-% IV SOLN
20.0000 mg | Freq: Once | INTRAVENOUS | Status: AC
  Administered 2019-10-06: 20 mg via INTRAVENOUS
  Filled 2019-10-06: qty 50

## 2019-10-06 MED ORDER — DIPHENHYDRAMINE HCL 50 MG/ML IJ SOLN
12.5000 mg | Freq: Once | INTRAMUSCULAR | Status: AC
  Administered 2019-10-06: 12.5 mg via INTRAVENOUS
  Filled 2019-10-06: qty 1

## 2019-10-06 MED ORDER — SODIUM CHLORIDE 0.9 % IV BOLUS
500.0000 mL | Freq: Once | INTRAVENOUS | Status: AC
  Administered 2019-10-06: 500 mL via INTRAVENOUS

## 2019-10-06 MED ORDER — METHYLPREDNISOLONE SODIUM SUCC 125 MG IJ SOLR
80.0000 mg | Freq: Once | INTRAMUSCULAR | Status: AC
  Administered 2019-10-06: 80 mg via INTRAVENOUS
  Filled 2019-10-06: qty 2

## 2019-10-06 NOTE — ED Triage Notes (Signed)
Patient states she ate strawberries and began to feel like her tongue was swelling. Patient states that she has had hives for a week. Pt is scheduled to have allergy testing and was told to stay off benadryl and zyrtec until after tests.

## 2019-10-06 NOTE — ED Provider Notes (Signed)
[facility] EMERGENCY DEPARTMENT Provider Note   CSN: [id number] Arrival date & time: 10/06/19  1957     History Chief Complaint  Patient presents with  . Allergic Reaction    [name] is a 74 y.o. female.  Level 5 caveat for acuity of condition.  Chief complaint swelling of tongue and tightness in throat after eating strawberries at dinnertime.  Four visits to her primary care doctor in the past 1 to 2 months for allergic phenomenon.  She was seen by her doctor yesterday and given an IM steroid shot.  No respiratory distress.  She was scheduled for an allergy test this Friday.        Past Medical History:  Diagnosis Date  . Allergy    RHINITIS  . Osteoporosis    OSTEOPENIA  . Thyroid disease    HYPOTHYROID    Patient Active Problem List   Diagnosis Date Noted  . S/P cataract surgery 09/18/2018  . Vitiligo 04/07/2015  . Hypothyroid 05/29/2011  . Allergic rhinitis due to pollen 05/29/2011  . Osteopenia 05/29/2011    Past Surgical History:  Procedure Laterality Date  . APPENDECTOMY    . arm surgery Right   . CHOLECYSTECTOMY    . SKIN BIOPSY Left 02/28/2018   shave lower left back neurofibroma     OB History   No obstetric history on file.     Family History  Problem Relation Age of Onset  . Stroke Mother   . Arthritis Mother     Social History   Tobacco Use  . Smoking status: Never Smoker  . Smokeless tobacco: Never Used  Substance Use Topics  . Alcohol use: No  . Drug use: No    Home Medications Prior to Admission medications   Medication Sig Start Date End Date Taking? Authorizing Provider  aspirin 81 MG tablet Take 81 mg by mouth daily.      [provider]  Calcium Citrate (CITRACAL PO) Take 1,000 mg by mouth.    [provider]  cetirizine (ZYRTEC) 10 MG tablet Take 10 mg by mouth daily.    [provider]  diphenhydrAMINE (BENADRYL) 25 MG tablet Take 25 mg by mouth every 6 (six) hours as needed.     [provider]  EPINEPHrine 0.3 mg/0.3 mL IJ SOAJ injection Inject 0.3 mLs (0.3 mg total) into the muscle as needed for anaphylaxis. 08/17/19   Rita Ohara, MD  FIBER PO Take by mouth.    [provider]  HONEY PO Take 15 mLs by mouth daily.    [provider]  hydrocortisone cream 1 % Apply 1 application topically as needed for itching.    [provider]  levothyroxine (SYNTHROID, LEVOTHROID) 100 MCG tablet Take 1 tablet (100 mcg total) by mouth daily. 09/19/18   Denita Lung, MD  mineral oil-hydrophilic petrolatum (AQUAPHOR) ointment Apply 1 application topically as needed for dry skin.    [provider]  Multiple Vitamins-Minerals (MULTIVITAMIN WITH MINERALS) tablet Take 1 tablet by mouth daily.    [provider]  predniSONE (STERAPRED UNI-PAK 21 TAB) 10 MG (21) TBPK tablet Use as directed Patient not taking: Reported on 10/02/2019 08/17/19   Rita Ohara, MD  Probiotic Product (PROBIOTIC DAILY PO) Take 2 each by mouth daily.    [provider]    Allergies    Levofloxacin and Shellfish allergy  Review of Systems   Review of Systems  Unable to perform ROS: Acuity of condition  Physical Exam Updated Vital Signs BP (!) 175/87 (BP Location: Right Arm)   Pulse 95   Temp 98.6 F (37 C) (Oral)   Resp 18   Ht 5\' 2"  (1.575 m)   Wt 63.5 kg   SpO2 99%   BMI 25.61 kg/m   Physical Exam Vitals and nursing note reviewed.  Constitutional:      Appearance: She is well-developed.     Comments: Slight facial swelling  HENT:     Head: Normocephalic and atraumatic.     Comments: Tongue minimally edematous Eyes:     Conjunctiva/sclera: Conjunctivae normal.  Neck:     Comments: Able to swallow Cardiovascular:     Rate and Rhythm: Normal rate and regular rhythm.  Pulmonary:     Effort: Pulmonary effort is normal.     Breath sounds: Normal breath sounds.  Abdominal:     General: Bowel sounds are normal.     Palpations:  Abdomen is soft.  Musculoskeletal:        General: Normal range of motion.     Cervical back: Neck supple.  Skin:    General: Skin is warm and dry.  Neurological:     General: No focal deficit present.     Mental Status: She is alert and oriented to person, place, and time.  Psychiatric:        Behavior: Behavior normal.     ED Results / Procedures / Treatments   Labs (all labs ordered are listed, but only abnormal results are displayed) Labs Reviewed - No data to display  EKG None  Radiology No results found.  Procedures Procedures (including critical care time)  Medications Ordered in ED Medications  methylPREDNISolone sodium succinate (SOLU-MEDROL) 125 mg/2 mL injection 80 mg (has no administration in time range)  diphenhydrAMINE (BENADRYL) injection 12.5 mg (has no administration in time range)  famotidine (PEPCID) IVPB 20 mg premix (has no administration in time range)  sodium chloride 0.9 % bolus 500 mL (has no administration in time range)    ED Course  I have reviewed the triage vital signs and the nursing notes.  Pertinent labs & imaging results that were available during my care of the patient were reviewed by me and considered in my medical decision making (see chart for details).    MDM Rules/Calculators/A&P                      History and physical consistent with allergic reaction.  IV Solu-Medrol, IV Pepcid, IV Benadryl.  Observation.  Will discuss with Dr.Glick. Final Clinical Impression(s) / ED Diagnoses Final diagnoses:  Allergic reaction, initial encounter    Rx / DC Orders ED Discharge Orders    None       Nat Christen, MD 10/06/19 2257

## 2019-10-07 ENCOUNTER — Other Ambulatory Visit: Payer: Self-pay | Admitting: Family Medicine

## 2019-10-07 ENCOUNTER — Telehealth: Payer: Self-pay

## 2019-10-07 DIAGNOSIS — E038 Other specified hypothyroidism: Secondary | ICD-10-CM

## 2019-10-07 MED ORDER — PREDNISONE 50 MG PO TABS: 50.0000 mg | ORAL_TABLET | Freq: Every day | ORAL | 0 refills | Status: DC

## 2019-10-07 NOTE — Telephone Encounter (Signed)
Pt says she is fine and will contact allergy office to fine out if she still can have allergy test due to her having to take zyrtec. Pt will call office back to inform. [place]

## 2019-10-07 NOTE — Telephone Encounter (Signed)
Have her follow-up with her allergist concerning how to handle this

## 2019-10-07 NOTE — ED Provider Notes (Signed)
Care assumed from Dr. Lacinda Axon, patient with allergic reaction treated with diphenhydramine, famotidine, methylprednisolone.  She was observed in the ED and is feeling much better.  She apparently had stopped taking her antihistamines in order to get allergy testing this week.  She is advised to contact her allergist to reschedule her testing and is discharged with prescription for prednisone.   Delora Fuel, MD [id number] [phone]

## 2019-10-07 NOTE — Telephone Encounter (Signed)
Pt advised she is better now. She had iv medicine . She will contact allergy doctor to find out if she can still have her allergy test due to her having to take zyrtec. Pt stated after she will call back and advise office. [place]

## 2019-10-07 NOTE — Telephone Encounter (Signed)
Pt. Called stating she had an emergency last night her tongued swelled up, she said she talked top you last night about it and she went to the hospital. The doctor at the hospital wanted her to start prednisone, pepcid, and benadryl. She said she is supposed to see an allergist this Friday but they told her to stop her anti histamines before her allergy test, so she is confused now on if she should be taking these medication she was given at the hospital.

## 2019-10-07 NOTE — Discharge Instructions (Addendum)
Follow up with your allergist.

## 2019-10-09 ENCOUNTER — Other Ambulatory Visit: Payer: Self-pay

## 2019-10-09 ENCOUNTER — Ambulatory Visit: Payer: Medicare Other | Admitting: Allergy & Immunology

## 2019-10-09 ENCOUNTER — Encounter: Payer: Self-pay | Admitting: Allergy & Immunology

## 2019-10-09 VITALS — BP 138/78 | HR 74 | Temp 98.1°F | Ht 61.81 in | Wt 141.2 lb

## 2019-10-09 DIAGNOSIS — M79642 Pain in left hand: Secondary | ICD-10-CM

## 2019-10-09 DIAGNOSIS — M79641 Pain in right hand: Secondary | ICD-10-CM

## 2019-10-09 DIAGNOSIS — L508 Other urticaria: Secondary | ICD-10-CM

## 2019-10-09 DIAGNOSIS — R911 Solitary pulmonary nodule: Secondary | ICD-10-CM | POA: Diagnosis not present

## 2019-10-09 DIAGNOSIS — R768 Other specified abnormal immunological findings in serum: Secondary | ICD-10-CM | POA: Diagnosis not present

## 2019-10-09 NOTE — Progress Notes (Signed)
NEW PATIENT  Date of Service/Encounter:  10/09/19  Referring provider: Denita Lung, MD   Assessment:   Acute urticaria  History of elevated ANA - unclear significance  History of abnormal chest CT (followed by Dr. Vaughan Moore)    Ms. [name] is a delightful 74 year old female presenting for an evaluation of urticaria which has been ongoing for approximately 3 weeks.  It is unclear of the trigger, but I doubt that this is related to food.  She is very anxious about this, but I recommended that she just go ahead and continue to eat what she would have eaten in any way.  I do think strawberries were related since she was having hives before she ate strawberries.  There really is no consistent history with food triggering the symptoms, so I do not want her to stress about this.  We are going to get some lab work to look into this more fully and see her in close follow up.     Plan/Recommendations:   1. Acute urticaria - Your history does not have any "red flags" such as fevers, joint pains, or permanent skin changes that would be concerning for a more serious cause of hives.  - However, you did have the elevated ANA a couple of years ago so I want to look into that again.  - I am repeating that today and we will have results back in a week or two.  - We will get some labs to rule out serious causes of hives: complete blood count, tryptase level, chronic urticaria panel, CMP, ESR, and CRP. - Chronic hives are often times a self limited process and will "burn themselves out" over 6-12 months, although this is not always the case.  - We are going to get some screening for food allergies as well as well environmental allergies.  - In the meantime, start suppressive dosing of antihistamines:   - Morning: Zyrtec (cetirizine) 39m (one tablet) + Pepcid (famotidine) 254m - Evening: Zyrtec (cetirizine) 1024mone tablet) + Pepcid (famotidine) 94m73mYou can change this dosing at home,  decreasing the dose as needed or increasing the dosing as needed.  - If you are not tolerating the medications or are tired of taking them every day, we can start treatment with a monthly injectable medication called Xolair.  - But I want to make sure that we are ruling out other causes before we go this route.   2. Return in 10 days (on 10/19/2019). This can be an in-person, a virtual Webex or a telephone follow up visit.   Subjective:   [name] 73 y51. female presenting today for evaluation of  Chief Complaint  Patient presents with  . Urticaria    Monday morning with hives all over body    [name] a history of the following: Patient Active Problem List   Diagnosis Date Noted  . S/P cataract surgery 09/18/2018  . Vitiligo 04/07/2015  . Hypothyroid 05/29/2011  . Allergic rhinitis due to pollen 05/29/2011  . Osteopenia 05/29/2011    History obtained from: chart review and patient.  [name] referred by [name].     [name] 73 y46. female presenting for an evaluation of acute urticaria. She was eating on Tuesday night. She started having tongue swelling and was eating strawberries at the time. She did put some sugar on them. This was around 6:30pm. She had a [employer] meal  for lunch and oatmeal for breakfast. She denies viral infections. She did have her COVID19 shot on March 9th and she has not had the second one.   Her first episode of hives occurred on March 19th. They have tended to disappear with a steroid shot. She continued to have hives off and on. She followed up on March 22nd.   This scared her so she went to the ED. She received IV Benadryl as well as steroids and famotidine. Her tongue has felt burnt and red. This continues to this day. She had hives over her entire body and she has a lot of pictures to show me today. She is on an extended prednisone taper of five days. She has been on cetirizine and Benadryl. Currently  she is on cetrizine once daily and Benadryl twice daily.   She feels that some of this might be stressed. She is doing some renovations to her home. She is widowed and has all of this responsibility on her. She also has a situation in her home where there is water under her house. She has a sump pump. Her father died last year and she has had the responsibility of helping to finalize the estate. She has been building onto her home and her head "is in the whirl" with all of these major decisions that she has been making.  She reports allergies to animals and dust. She has never been allergy tested. The only known food allergy is shellfish, from a reaction around 30-40 years ago.   Two years ago, she had a nodule in her Moore found. She was told that she has pneumonia. Per the note from March 2019, she was attending a funeral in February 2019 when she developed a respiratory tract infection with cough and mucous production. She was seen in the ED in February 2019 where she had a chest CT That demonstrated bilateral nodular infiltrates with possible cavitation. A CT angio was negative for PE. A follow up chest CT in March 2019 showed improvement but with a RUL calcified nodule consistent with a granuloma.   She does report some wheezing and snoring. She has never had an inhaler. She is going to have a chest CT on Monday morning. She is followed by Dr. Rolla Moore and has an appointment scheduled for April 8th about the CT scan. As of her last visit in March 2019, she was doing well following a course of azithromycin.   As part of that pulmonology workup, an ANA was done which demonstrated a titer of 1:160 with a speckled pattern. This was felt to be non-specific.   Otherwise, there is no history of other atopic diseases, including food allergies, drug allergies, stinging insect allergies or contact dermatitis. There is no significant infectious history. Vaccinations are up to date.    Past Medical  History: Patient Active Problem List   Diagnosis Date Noted  . S/P cataract surgery 09/18/2018  . Vitiligo 04/07/2015  . Hypothyroid 05/29/2011  . Allergic rhinitis due to pollen 05/29/2011  . Osteopenia 05/29/2011    Medication List:  Allergies as of 10/09/2019      Reactions   Levofloxacin    Shellfish Allergy       Medication List       Accurate as of October 09, 2019 11:57 AM. If you have any questions, ask your nurse or doctor.        aspirin 81 MG tablet Take 81 mg by mouth daily.   cetirizine 10 MG tablet  Commonly known as: ZYRTEC Take 10 mg by mouth daily.   CITRACAL PO Take 1,000 mg by mouth.   diphenhydrAMINE 25 MG tablet Commonly known as: BENADRYL Take 25 mg by mouth every 6 (six) hours as needed.   EPINEPHrine 0.3 mg/0.3 mL Soaj injection Commonly known as: EPI-PEN Inject 0.3 mLs (0.3 mg total) into the muscle as needed for anaphylaxis.   famotidine 20 MG tablet Commonly known as: PEPCID Take 20 mg by mouth 2 (two) times daily.   FIBER PO Take by mouth.   HONEY PO Take 15 mLs by mouth daily.   hydrocortisone cream 1 % Apply 1 application topically as needed for itching.   levothyroxine 100 MCG tablet Commonly known as: SYNTHROID TAKE 1 TABLET(100 MCG) BY MOUTH DAILY   mineral oil-hydrophilic petrolatum ointment Apply 1 application topically as needed for dry skin.   multivitamin with minerals tablet Take 1 tablet by mouth daily.   predniSONE 50 MG tablet Commonly known as: DELTASONE Take 1 tablet (50 mg total) by mouth daily.   PROBIOTIC DAILY PO Take 2 each by mouth daily.       Birth History: non-contributory  Developmental History: non-contributory  Past Surgical History: Past Surgical History:  Procedure Laterality Date  . APPENDECTOMY    . arm surgery Right   . CHOLECYSTECTOMY    . SINOSCOPY    . SKIN BIOPSY Left 02/28/2018   shave lower left back neurofibroma     Family History: Family History  Problem  Relation Age of Onset  . Stroke Mother   . Arthritis Mother   . Allergic rhinitis Mother   . Allergic rhinitis Brother   . Allergic rhinitis Maternal Grandmother   . Asthma Neg Hx   . Eczema Neg Hx   . Urticaria Neg Hx      Social History: Sharla lives at home without any animals. There is no smoking exposure at all. She was born and raised in [place]. She works as a Art therapist.    Review of Systems  Constitutional: Negative.  Negative for fever, malaise/fatigue and weight loss.  HENT: Negative.  Negative for ear discharge, ear pain, sinus pain and sore throat.   Eyes: Negative for pain, discharge and redness.  Respiratory: Positive for cough. Negative for sputum production, shortness of breath and wheezing.   Cardiovascular: Negative.  Negative for chest pain and palpitations.  Gastrointestinal: Negative for abdominal pain, constipation, diarrhea, heartburn, nausea and vomiting.  Skin: Positive for itching and rash.  Neurological: Negative for dizziness and headaches.  Endo/Heme/Allergies: Negative for environmental allergies. Does not bruise/bleed easily.       Objective:   Blood pressure 138/78, pulse 74, temperature 98.1 F (36.7 C), temperature source Temporal, height 5' 1.81" (1.57 m), weight 141 lb 3.2 oz (64 kg), SpO2 96 %. Body mass index is 25.98 kg/m.   Physical Exam:   Physical Exam  Constitutional: She appears well-developed.  Very pleasant female. Somewhat hard of hearing (despite having hearing aids).  HENT:  Head: Normocephalic and atraumatic.  Right Ear: Tympanic membrane, external ear and ear canal normal. No drainage, swelling or tenderness. Tympanic membrane is not injected, not scarred, not erythematous, not retracted and not bulging.  Left Ear: Tympanic membrane, external ear and ear canal normal. No drainage, swelling or tenderness. Tympanic membrane is not injected, not scarred, not erythematous, not retracted and not bulging.   Nose: No mucosal edema, rhinorrhea, nasal deformity or septal deviation. No epistaxis. Right sinus exhibits no maxillary sinus  tenderness and no frontal sinus tenderness. Left sinus exhibits no maxillary sinus tenderness and no frontal sinus tenderness.  Mouth/Throat: Uvula is midline and oropharynx is clear and moist. Mucous membranes are not pale and not dry.  Turbinates enlarged bilaterally without discharge. No mucous production noted.   Eyes: Pupils are equal, round, and reactive to light. Conjunctivae and EOM are normal. Right eye exhibits no chemosis and no discharge. Left eye exhibits no chemosis and no discharge. Right conjunctiva is not injected. Left conjunctiva is not injected.  Cardiovascular: Normal rate, regular rhythm and normal heart sounds.  Respiratory: Effort normal and breath sounds normal. No accessory muscle usage. No tachypnea. No respiratory distress. She has no wheezes. She has no rhonchi. She has no rales. She exhibits no tenderness.  No decreased work of breathing noted.   GI: There is no abdominal tenderness. There is no rebound and no guarding.  Lymphadenopathy:       Head (right side): No submandibular, no tonsillar and no occipital adenopathy present.       Head (left side): No submandibular, no tonsillar and no occipital adenopathy present.    She has no cervical adenopathy.  Neurological: She is alert.  Skin: No abrasion, no petechiae and no rash noted. Rash is not papular, not vesicular and not urticarial. No erythema. No pallor.  Psychiatric: She has a normal mood and affect.     Diagnostic studies: labs sent instead      Salvatore Marvel, MD [facility]

## 2019-10-09 NOTE — Patient Instructions (Addendum)
1. Acute urticaria - Your history does not have any "red flags" such as fevers, joint pains, or permanent skin changes that would be concerning for a more serious cause of hives.  - However, you did have the elevated ANA a couple of years ago so I want to look into that again.  - I am repeating that today and we will have results back in a week or two.  - We will get some labs to rule out serious causes of hives: complete blood count, tryptase level, chronic urticaria panel, CMP, ESR, and CRP. - Chronic hives are often times a self limited process and will "burn themselves out" over 6-12 months, although this is not always the case.  - We are going to get some screening for food allergies as well as well environmental allergies.  - In the meantime, start suppressive dosing of antihistamines:   - Morning: Zyrtec (cetirizine) 32m (one tablet) + Pepcid (famotidine) 265m - Evening: Zyrtec (cetirizine) 1029mone tablet) + Pepcid (famotidine) 61m76mYou can change this dosing at home, decreasing the dose as needed or increasing the dosing as needed.  - If you are not tolerating the medications or are tired of taking them every day, we can start treatment with a monthly injectable medication called Xolair.  - But I want to make sure that we are ruling out other causes before we go this route.   2. Return in 10 days (on 10/19/2019). This can be an in-person, a virtual Webex or a telephone follow up visit.   Please inform us o[place]any Emergency Department visits, hospitalizations, or changes in symptoms. Call us bKoreaore going to the ED for breathing or allergy symptoms since we might be able to fit you in for a sick visit. Feel free to contact us aKoreatime with any questions, problems, or concerns.  It was a pleasure to meet you today!  Websites that have reliable patient information: 1. American Academy of Asthma, Allergy, and Immunology: www.aaaai.org 2. Food Allergy Research and Education (FARE):  foodallergy.org 3. Mothers of Asthmatics: http://www.asthmacommunitynetwork.org 4. American College of Allergy, Asthma, and Immunology: www.acaai.org   COVID-19 Vaccine Information can be found at: [email or web address] questions related to vaccine distribution or appointments, please email [email or web address] or call [phone]  "Like" us oKoreaFacebook and Instagram for our latest updates!       HAPPY SPRING!  Make sure you are registered to vote! If you have moved or changed any of your contact information, you will need to get this updated before voting!  In some cases, you MAY be able to register to vote online: [email or web address]

## 2019-10-11 ENCOUNTER — Encounter: Payer: Self-pay | Admitting: Allergy & Immunology

## 2019-10-12 ENCOUNTER — Other Ambulatory Visit: Payer: Self-pay

## 2019-10-12 ENCOUNTER — Ambulatory Visit (HOSPITAL_COMMUNITY)
Admission: RE | Admit: 2019-10-12 | Discharge: 2019-10-12 | Disposition: A | Discharge status: Home / Self Care | Payer: Medicare Other | Source: Ambulatory Visit | Attending: Pulmonary Disease | Admitting: Pulmonary Disease

## 2019-10-12 DIAGNOSIS — J8410 Pulmonary fibrosis, unspecified: Secondary | ICD-10-CM | POA: Diagnosis not present

## 2019-10-12 DIAGNOSIS — R911 Solitary pulmonary nodule: Secondary | ICD-10-CM | POA: Insufficient documentation

## 2019-10-12 DIAGNOSIS — R918 Other nonspecific abnormal finding of lung field: Secondary | ICD-10-CM | POA: Diagnosis not present

## 2019-10-13 ENCOUNTER — Encounter: Payer: Self-pay | Admitting: Allergy & Immunology

## 2019-10-13 LAB — ALLERGEN, STRAWBERRY, F44: Allergen Strawberry IgE: <0.1 kU/L

## 2019-10-13 MED ORDER — HYDROXYZINE HCL 25 MG PO TABS: 25.0000 mg | ORAL_TABLET | Freq: Three times a day (TID) | ORAL | 0 refills | Status: DC | PRN

## 2019-10-13 NOTE — Addendum Note (Signed)
Addended by: Valentina Shaggy on: 10/13/2019 08:59 PM   Modules accepted: Orders

## 2019-10-13 NOTE — Progress Notes (Signed)
Patient called reporting tongue irritation and itching.  She tells me that she ate some egg with toast and a handful of sausage.  Within minutes, she started having a funny sensation on her tongue.  She took 1 Benadryl and is starting to feel better.  She reports that she had a bit of a pustule or blister at the tip of her tongue as well.  We did review some of the labs that had come back.  She had an elevated IgE to milk, wheat, peanut, and egg.  The wheat and the peanut in particular were very low, so I do not think these are relevant.  She did eat eggs tonight which fits with her reaction.  However, she reviews her diet from the weekend and reports that she had egg every day this weekend without any issues.  She did have milk this morning on her oatmeal without any problems.  She eats wheat and peanut butter without any problem, but she has not had any peanut since she started having these reactions around 1 month ago.  We reviewed the indications for anaphylaxis.  I think she can take another Benadryl if this continues to be an issue.  We did review her ANA, which did come back positive.  The titer is still pending, but the reflex demonstrated positive anti-SCL-70 antibodies.  She does report that she has right leg pain which has been better since she is been on the prednisone.  She also has very stiff joints in her fingertips.  She had some swelling of her joints when I saw her in clinic.  She is interested in going to see a rheumatologist.  We will give her a call tomorrow to see how she is doing.  Salvatore Marvel, MD [facility]

## 2019-10-14 ENCOUNTER — Telehealth: Payer: Self-pay

## 2019-10-14 NOTE — Telephone Encounter (Signed)
Referral placed to Dr.Deveshwar for review.  Thanks

## 2019-10-14 NOTE — Telephone Encounter (Signed)
-----   Message from Valentina Shaggy, MD sent at 10/13/2019  8:59 PM EDT ----- Rheumatology referral placed.

## 2019-10-14 NOTE — Telephone Encounter (Signed)
Spoke with patient and advise what you had recommended. Patient was concern on what foods to avoid so I offer her a [place] and Education sheet so that she can view what she needs to avoid. Patient was advise and verbalized understanding.

## 2019-10-14 NOTE — Telephone Encounter (Signed)
Pt calling today, states that she ate an egg sandwich and her mouth started tingling and she got a hive on her tongue.  Pt states that she has been afraid to eat because she does not know what she can eat.  Would like some advice on what she can eat.  Pt states that she is very frustrated.  Pt is also asking for her lab results. Explained to patient that I would send a message and call her back.   Dr Ernst Bowler, please advise, thank you.

## 2019-10-14 NOTE — Telephone Encounter (Signed)
The patient called me last night when I was on call and we talked for about 20 or 30 minutes about this very subject.  I told her that the labs were still pending.  I gave her the results of the labs that have come back.  I cannot make the labs come back any quicker.  I recommended that she avoid milk and egg.  The wheat and the peanut were positive but very low.  If she wants she can avoid wheat and peanut as well.  We still does have several pieces in her history that were trying to fit together.  Please make sure that she is taking Zyrtec 10 mg plus famotidine 20 mg twice daily.  She can add on Allegra 180 mg midday as well.  Salvatore Marvel, MD [facility]

## 2019-10-15 LAB — IGE+ALLERGENS ZONE 2(30)

## 2019-10-15 LAB — ALLERGEN PROFILE, BASIC FOOD
Allergen Corn, IgE: <0.1 kU/L
Beef IgE: <0.1 kU/L
Chocolate/Cacao IgE: <0.1 kU/L
Egg, Whole IgE: 0.33 kU/L — AB
Food Mix (Seafoods) IgE: NEGATIVE
Milk IgE: 0.95 kU/L — AB
Peanut IgE: 0.11 kU/L — AB
Pork IgE: <0.1 kU/L
Soybean IgE: <0.1 kU/L
Wheat IgE: 0.14 kU/L — AB

## 2019-10-15 LAB — SEDIMENTATION RATE: Sed Rate: 10 mm/hr (ref 0–40)

## 2019-10-15 LAB — RHEUMATOID FACTOR: Rheumatoid fact SerPl-aCnc: <10 IU/mL (ref 0.0–13.9)

## 2019-10-15 LAB — THYROID ANTIBODIES
Thyroglobulin Antibody: 2.9 IU/mL — ABNORMAL HIGH (ref 0.0–0.9)
Thyroperoxidase Ab SerPl-aCnc: 532 IU/mL — ABNORMAL HIGH (ref 0–34)

## 2019-10-15 LAB — ALLERGEN PROFILE, SHELLFISH
Clam IgE: <0.1 kU/L
F023-IgE Crab: <0.1 kU/L
F080-IgE Lobster: <0.1 kU/L
F290-IgE Oyster: <0.1 kU/L
Scallop IgE: <0.1 kU/L
Shrimp IgE: <0.1 kU/L

## 2019-10-15 LAB — PROTEIN ELECTROPHORESIS, SERUM
A/G Ratio: 1.1 (ref 0.7–1.7)
Albumin ELP: 3.7 g/dL (ref 2.9–4.4)
Alpha 1: 0.3 g/dL (ref 0.0–0.4)
Alpha 2: 0.8 g/dL (ref 0.4–1.0)
Beta: 1.1 g/dL (ref 0.7–1.3)
Gamma Globulin: 1.1 g/dL (ref 0.4–1.8)
Globulin, Total: 3.3 g/dL (ref 2.2–3.9)
Total Protein: 7 g/dL (ref 6.0–8.5)

## 2019-10-15 LAB — ANA W/REFLEX IF POSITIVE
Anti JO-1: <0.2 AI (ref 0.0–0.9)
Anti Nuclear Antibody (ANA): POSITIVE — AB
Centromere Ab Screen: <0.2 AI (ref 0.0–0.9)
Chromatin Ab SerPl-aCnc: <0.2 AI (ref 0.0–0.9)
ENA RNP Ab: <0.2 AI (ref 0.0–0.9)
ENA SM Ab Ser-aCnc: <0.2 AI (ref 0.0–0.9)
ENA SSA (RO) Ab: <0.2 AI (ref 0.0–0.9)
ENA SSB (LA) Ab: <0.2 AI (ref 0.0–0.9)
Scleroderma (Scl-70) (ENA) Antibody, IgG: 1.3 AI — ABNORMAL HIGH (ref 0.0–0.9)
dsDNA Ab: <1 IU/mL (ref 0–9)

## 2019-10-15 LAB — TRYPTASE: Tryptase: 3.3 ug/L (ref 2.2–13.2)

## 2019-10-15 LAB — C-REACTIVE PROTEIN: CRP: <1 mg/L (ref 0–10)

## 2019-10-15 LAB — ALPHA-GAL PANEL
Alpha Gal IgE*: <0.1 kU/L (ref <0.10)
Beef (Bos spp) IgE: <0.1 kU/L (ref <0.35)
Class Interpretation: 0
Class Interpretation: 0
Class Interpretation: 0
Lamb/Mutton (Ovis spp) IgE: <0.1 kU/L (ref <0.35)
Pork (Sus spp) IgE: <0.1 kU/L (ref <0.35)

## 2019-10-18 NOTE — Progress Notes (Signed)
[address], [address] [place] [address] Dept: [phone]  FOLLOW UP NOTE  Patient ID: [name], female    DOB: 03/15/1946  Age: 74 y.o. MRN: [id number] Date of Office Visit: 10/19/2019  Assessment  Chief Complaint: Urticaria (No new episodes. Dietary concerns.)  HPI [name] is a 74 year old female who presents to the clinic for a follow up visit. She was last seen in this clinic on 10/09/2019 by Dr. Ernst Bowler for evaluation of chronic urticaria. At that time, her ANA was positive and her SCL-80 was found to be elevated. At this time, a referral has been placed for Dr Estanislado Pandy, rheumatology specialist, to review.  At today's visit she reports her urticaria has been well controlled breakouts occurring over the last 2 weeks.  She continues taking Allegra in the evening, Zyrtec in the morning, and famotidine twice a day.  She is currently avoiding milk, wheat, peanut, strawberry and egg. She has not had any accidental ingestion nor has she needed to use her epinephrine auto-injector since her last visit to this clinic. Labs from her previous visit were discussed in detail. She does not feel comfortable adding any of these foods back into her diet before skin testing is performed. Her current medications are listed in the chart.    Drug Allergies:  Allergies  Allergen Reactions  . Levofloxacin   . Shellfish Allergy     Physical Exam: BP 134/76 (BP Location: Left Arm, Patient Position: Sitting, Cuff Size: Normal)   Pulse 77   Temp (!) 97.4 F (36.3 C) (Temporal)   Resp 16   SpO2 99%    Physical Exam Vitals reviewed.  Constitutional:      Appearance: Normal appearance.  HENT:     Head: Normocephalic and atraumatic.     Right Ear: Tympanic membrane normal.     Left Ear: Tympanic membrane normal.     Nose:     Comments: Bilateral nares normal. Pharynx normal. Ears normal. Eyes normal.    Mouth/Throat:     Pharynx: Oropharynx is clear.  Eyes:   Conjunctiva/sclera: Conjunctivae normal.  Cardiovascular:     Rate and Rhythm: Normal rate and regular rhythm.     Heart sounds: No murmur.  Pulmonary:     Effort: Pulmonary effort is normal.     Breath sounds: Normal breath sounds.     Comments: Lungs clear to auscultation Musculoskeletal:        General: Normal range of motion.     Cervical back: Normal range of motion and neck supple.  Skin:    General: Skin is warm and dry.  Neurological:     Mental Status: She is alert and oriented to person, place, and time.  Psychiatric:        Mood and Affect: Mood normal.        Behavior: Behavior normal.        Thought Content: Thought content normal.        Judgment: Judgment normal.    Assessment and Plan: 1. Acute urticaria   2. Elevated antinuclear antibody (ANA) level   3. Allergy with anaphylaxis due to food     Patient Instructions  Hives (urticaria)- Start this after your skin testing on Friday  . Continue Zyrtec and Pepcid in the morning and Allegra and Pepcid in the evening  May use Benadryl (diphenhydramine) as needed for breakthrough hives       If symptoms return, then step up dosage  Keep a  detailed symptom journal including foods eaten, contact with allergens, medications taken, weather changes.   Food allergy Continue to avoid shellfish, milk, strawberry, wheat, peanut, and egg.. In case of an allergic reaction, take Benadryl 50 mg every 4 hours, and if life-threatening symptoms occur, inject with EpiPen 0.3 mg. Return to the clinic for skin testing to shellfish, milk, strawberry, wheat, peanut, and egg. Remember to stop your antihistamines for 3 days before the skin testing appointment  Lab results discussed in detail. Patient's questions answered.   Call the clinic if this treatment plan is not working well for you  Follow up in 4 days or sooner if needed.   Return in about 4 days (around 10/23/2019), or if symptoms worsen or fail to improve.    Thank you  for the opportunity to care for this patient.  Please do not hesitate to contact me with questions.  Gareth Morgan, FNP [facility]

## 2019-10-19 ENCOUNTER — Encounter: Payer: Self-pay | Admitting: Family Medicine

## 2019-10-19 ENCOUNTER — Other Ambulatory Visit: Payer: Self-pay

## 2019-10-19 ENCOUNTER — Ambulatory Visit (INDEPENDENT_AMBULATORY_CARE_PROVIDER_SITE_OTHER): Payer: Medicare Other | Admitting: Family Medicine

## 2019-10-19 VITALS — BP 134/76 | HR 77 | Temp 97.4°F | Resp 16

## 2019-10-19 DIAGNOSIS — R768 Other specified abnormal immunological findings in serum: Secondary | ICD-10-CM | POA: Diagnosis not present

## 2019-10-19 DIAGNOSIS — L508 Other urticaria: Secondary | ICD-10-CM | POA: Insufficient documentation

## 2019-10-19 DIAGNOSIS — T7800XA Anaphylactic reaction due to unspecified food, initial encounter: Secondary | ICD-10-CM | POA: Diagnosis not present

## 2019-10-19 NOTE — Patient Instructions (Addendum)
Hives (urticaria)- Start this after your skin testing on Friday  . Continue Zyrtec and Pepcid in the morning and Allegra and Pepcid in the evening  May use Benadryl (diphenhydramine) as needed for breakthrough hives       If symptoms return, then step up dosage  Keep a detailed symptom journal including foods eaten, contact with allergens, medications taken, weather changes.   Food allergy Continue to avoid shellfish, milk, strawberry, wheat, peanut, and egg. In case of an allergic reaction, take Benadryl 50 mg every 4 hours, and if life-threatening symptoms occur, inject with EpiPen 0.3 mg. Return to the clinic for skin testing to shellfish, milk, strawberry, wheat, peanut, and egg.. Remember to stop your antihistamines for 3 days before the skin testing appointment   Call the clinic if this treatment plan is not working well for you  Follow up in 4 days or sooner if needed.

## 2019-10-22 ENCOUNTER — Other Ambulatory Visit: Payer: Self-pay

## 2019-10-22 ENCOUNTER — Ambulatory Visit: Payer: Medicare Other | Admitting: Pulmonary Disease

## 2019-10-22 ENCOUNTER — Encounter: Payer: Self-pay | Admitting: Pulmonary Disease

## 2019-10-22 VITALS — BP 120/70 | HR 67 | Temp 97.3°F | Ht 62.0 in | Wt 138.0 lb

## 2019-10-22 DIAGNOSIS — R918 Other nonspecific abnormal finding of lung field: Secondary | ICD-10-CM

## 2019-10-22 DIAGNOSIS — J4521 Mild intermittent asthma with (acute) exacerbation: Secondary | ICD-10-CM | POA: Diagnosis not present

## 2019-10-22 NOTE — Progress Notes (Signed)
[name]    SX:[id number]    1946-06-16  Primary Care Physician:Lalonde, Elyse Jarvis, MD  Referring Physician: Denita Lung, [place],  [place] [address]  Chief complaint: Follow up for abnormal CT  HPI: 74 year old with history of allergies, osteoporosis, hypothyroidism.   Reports attending a funeral for sister on 08/30/17 and developed respiratory tract infection with cough, dark mucus shortly thereafter.  She had one episode of nighttime chills, no reported fevers.  No hemoptysis Evaluated in the ED on 2/23 with right chest pain ruled out for cardiac causes.  She had a CT chest that shows bilateral nodular infiltrates with possible cavitation and treated with Z-Pak She has no risk factors for TB, denies any hemoptysis, night sweats, fevers, hemoptysis.  She is not making any more sputum  Pets: None Occupation: Works as a Art therapist Exposures: No known exposures, no mold, hot tub Smoking history: Never smoker Travel History: Born and raised in [place]..  No foreign travel.  Interim history: Continues to do well after her episode of pneumonia in 2019. She is currently being evaluated for hives and possible food allergy  Reports intermittent wheezing.  No dyspnea.  She says that she is usually on antihistamines for allergies which improves the wheezing.  She is currently off antihistamines to get skin testing done.  Outpatient Encounter Medications as of 10/22/2019  Medication Sig  . aspirin 81 MG tablet Take 81 mg by mouth daily.    . Calcium Citrate (CITRACAL PO) Take 1,000 mg by mouth.  . diphenhydrAMINE (BENADRYL) 25 MG tablet Take 25 mg by mouth every 6 (six) hours as needed.  Marland Kitchen EPINEPHrine 0.3 mg/0.3 mL IJ SOAJ injection Inject 0.3 mLs (0.3 mg total) into the muscle as needed for anaphylaxis.  . famotidine (PEPCID) 20 MG tablet Take 20 mg by mouth 2 (two) times daily.  . fexofenadine (ALLEGRA) 180 MG tablet Take 180 mg by mouth  daily.  Marland Kitchen FIBER PO Take by mouth.  . HONEY PO Take 15 mLs by mouth daily.  . hydrocortisone cream 1 % Apply 1 application topically as needed for itching.  . hydrOXYzine (ATARAX/VISTARIL) 25 MG tablet Take 1 tablet (25 mg total) by mouth 3 (three) times daily as needed.  Marland Kitchen levothyroxine (SYNTHROID) 100 MCG tablet TAKE 1 TABLET(100 MCG) BY MOUTH DAILY  . mineral oil-hydrophilic petrolatum (AQUAPHOR) ointment Apply 1 application topically as needed for dry skin.  . Multiple Vitamins-Minerals (MULTIVITAMIN WITH MINERALS) tablet Take 1 tablet by mouth daily.  . Probiotic Product (PROBIOTIC DAILY PO) Take 2 each by mouth daily.   No facility-administered encounter medications on file as of 10/22/2019.    Physical Exam: Blood pressure 120/70, pulse 67, temperature (!) 97.3 F (36.3 C), temperature source Temporal, height 5\' 2"  (1.575 m), weight 138 lb (62.6 kg), SpO2 99 %. Gen:      No acute distress HEENT:  EOMI, sclera anicteric Neck:     No masses; no thyromegaly Lungs:    Clear to auscultation bilaterally; normal respiratory effort CV:         Regular rate and rhythm; no murmurs Abd:      + bowel sounds; soft, non-tender; no palpable masses, no distension Ext:    No edema; adequate peripheral perfusion Skin:      Warm and dry; no rash Neuro: alert and oriented x 3 Psych: normal mood and affect  Data Reviewed: Labs 09/07/17 QuantiFERON gold 09/07/17- negative  CBC    Component Value Date/Time   WBC 6.1 09/21/2019 0950   WBC 10.2 09/07/2017 0120   RBC 4.53 09/21/2019 0950   RBC 3.97 09/07/2017 0120   HGB 14.4 09/21/2019 0950   HCT 43.2 09/21/2019 0950   PLT 233 09/21/2019 0950   MCV 95 09/21/2019 0950   MCH 31.8 09/21/2019 0950   MCH 30.5 09/07/2017 0120   MCHC 33.3 09/21/2019 0950   MCHC 32.3 09/07/2017 0120   RDW 13.3 09/21/2019 0950   LYMPHSABS 1.6 09/21/2019 0950   MONOABS 484 05/17/2016 1357   EOSABS 0.2 09/21/2019 0950   BASOSABS 0.0 09/21/2019 0950    Blood culture  09/07/17-negative  CT angio 09/07/17- no evidence of pulmonary embolism, bilateral pulmonary nodules, consolidative changes, right hilar adenopathy CT 10/10/17-resolution of bilateral nodules, consolidative changes.  Stable right upper lobe calcified nodule consistent with granuloma. CT 10/12/2019-4 mm new right lower lobe nodule. I have reviewed the images personally.  Serologies 09/10/17 ANA 1:160, speckled CCP, rheumatoid factor negative  Assessment:  Follow-up for abnormal CT scan Improved after episode of pneumonia in 2019.  Recent CT reviewed with no interstitial process.  Right upper lobe subcentimeter nodule Suspicion for lung malignancy is low as she is a non smoker.  The nodule is calcified indicating benign granuloma CT shows a new right lower lobe nodule which is likely benign.  Follow-up CT in 12 months  Elevated ANA Likely nonspecific as ANA has speckled pattern with low titer.  She does not have any symptoms of connective tissue disease We will continue to monitor this.  Intermittent wheezing May have asthma given history of allergies and elevated peripheral eosinophils Does not need inhalers as symptoms are mild.  Get PFTs for baseline evaluation.  Plan/Recommendations: - Follow up CT chest in 1 year - PFTs  Marshell Garfinkel MD [facility] Pager [phone] 10/22/2019, 9:27 AM  CC: Denita Lung, MD

## 2019-10-22 NOTE — Patient Instructions (Signed)
I have reviewed your CT scan which shows a tiny lung nodule which is likely benign Get a CT chest without contrast in 4 months for review We will also do PFTs in 6 months for evaluation of your lungs and the intermittent wheezing Follow-up in clinic after 6 months

## 2019-10-23 ENCOUNTER — Encounter: Payer: Self-pay | Admitting: Allergy & Immunology

## 2019-10-23 ENCOUNTER — Ambulatory Visit: Payer: Medicare Other | Admitting: Allergy & Immunology

## 2019-10-23 ENCOUNTER — Other Ambulatory Visit: Payer: Self-pay

## 2019-10-23 VITALS — BP 130/72 | HR 61 | Temp 97.7°F | Resp 16

## 2019-10-23 DIAGNOSIS — L508 Other urticaria: Secondary | ICD-10-CM | POA: Diagnosis not present

## 2019-10-23 DIAGNOSIS — T7800XA Anaphylactic reaction due to unspecified food, initial encounter: Secondary | ICD-10-CM | POA: Diagnosis not present

## 2019-10-23 NOTE — Progress Notes (Signed)
FOLLOW UP  Date of Service/Encounter:  10/23/19   Assessment:   Acute urticaria  Allergy with  - with negative testing to both panels today  Elevated ANA      [name] remains very anxious about her eating habits. Testing today shows that she should be able to tolerate a number of different foods, and I tried to emphasize this to her today. She is going to restart the cetirizine to keep the reactions at [place] and we came up with a plan for her to introduce these foods back into her diet in a scheduled way.    Plan/Recommendations:   1. Acute urticaria - All of the foods were negative. - All of the environmental allergens were negative. - Copies provided. - Restart your Zyrtec (cetirizine) one or two times daily. - Introduce wheat back into your diet and wait a couple of days. - Introduce peanuts back into your diet and wait a couple of days. - Do these for two weeks and introduce egg back into your diet and wait a couple of days. - Then introduce milk into your diet and wait a couple of days.  - Call us with updates.  2. Elevated ANA  - Rheumatology appointment pending in July 2021.  3. Return in about 6 weeks (around 12/04/2019). This can be an in-person, a virtual Webex or a telephone follow up visit.  Subjective:   [name] is a 74 y.o. female presenting today for follow up of  Chief Complaint  Patient presents with  . Allergy Testing    [name] has a history of the following: Patient Active Problem List   Diagnosis Date Noted  . Acute urticaria 10/19/2019  . Elevated antinuclear antibody (ANA) level 10/19/2019  . 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 S/P cataract surgery 09/18/2018  . Vitiligo 04/07/2015  . Hypothyroid 05/29/2011  . Allergic rhinitis due to pollen 05/29/2011  . Osteopenia 05/29/2011    History obtained from: chart review and patient.  [name] is a 74 y.o. female presenting for a follow up visit. She is here  for skin testing.   Since the last visit, she has continued to be anxious about everything that she is eating. She eats oatmeal for breakfast. She eats squash for luch. She has been eating baked chicken and [place]. She has had no issues with that. She has not had any antihistamines since Monday. She is clearly nervous and wants to know how much she can expand her diet. She will eat baked potato and the sauteed squash.   He saw Pulmonology yesterday and she is following up in four months. There was a new nodule in her left lung. She also mentioned wheezing and they are going to do a full breathing test in October 2021. She does notice that when she takes her antihistamines, she does not notice the wheezing.   She is going to see Rheumatology in July 2021. She is going to see Dr. Estanislado Pandy.   Otherwise, there have been no changes to her past medical history, surgical history, family history, or social history.    Review of Systems  Constitutional: Negative.  Negative for chills, fever, malaise/fatigue and weight loss.  HENT: Negative.  Negative for congestion, ear discharge, ear pain and sore throat.   Eyes: Negative for pain, discharge and redness.  Respiratory: Negative for cough, sputum production, shortness of breath and wheezing.   Cardiovascular: Negative.  Negative for chest pain and palpitations.  Gastrointestinal: Negative for abdominal pain, constipation, diarrhea, heartburn, nausea and vomiting.  Skin: Negative.  Negative for itching and rash.  Neurological: Negative for dizziness and headaches.  Endo/Heme/Allergies: Negative for environmental allergies. Does not bruise/bleed easily.       Objective:   Blood pressure 130/72, pulse 61, temperature 97.7 F (36.5 C), temperature source Temporal, resp. rate 16, SpO2 98 %. There is no height or weight on file to calculate BMI.   Physical Exam:  Physical Exam  Constitutional: She appears well-developed.  Anxious female.  Talkative. On edge.   HENT:  Head: Normocephalic and atraumatic.  Right Ear: Tympanic membrane, external ear and ear canal normal.  Left Ear: Tympanic membrane and ear canal normal.  Nose: No mucosal edema, rhinorrhea, nasal deformity or septal deviation. No epistaxis. Right sinus exhibits no maxillary sinus tenderness and no frontal sinus tenderness. Left sinus exhibits no maxillary sinus tenderness and no frontal sinus tenderness.  Mouth/Throat: Uvula is midline and oropharynx is clear and moist. Mucous membranes are not pale and not dry.  Eyes: Pupils are equal, round, and reactive to light. Conjunctivae and EOM are normal. Right eye exhibits no chemosis and no discharge. Left eye exhibits no chemosis and no discharge. Right conjunctiva is not injected. Left conjunctiva is not injected.  Cardiovascular: Normal rate, regular rhythm and normal heart sounds.  Respiratory: Effort normal and breath sounds normal. No accessory muscle usage. No tachypnea. No respiratory distress. She has no wheezes. She has no rhonchi. She has no rales. She exhibits no tenderness.  Lymphadenopathy:    She has no cervical adenopathy.  Neurological: She is alert.  Skin: No abrasion, no petechiae and no rash noted. Rash is not papular, not vesicular and not urticarial. No erythema. No pallor.  Psychiatric: She has a normal mood and affect.     Diagnostic studies:      Allergy Studies:    Airborne Adult Perc - 10/23/19 1036    Time Antigen Placed  1036    Allergen Manufacturer  Lavella Hammock    Location  Back    Number of Test  59    Panel 1  Select    1. Control-Buffer 50% Glycerol  Negative    2. Control-Histamine 1 mg/ml  2+    3. Albumin saline  Negative    4. [place]  Negative    5. [place]  Negative    6. Johnson  Negative    7. [place] Blue  Negative    8. Meadow Fescue  Negative    9. Perennial Rye  Negative    10. Sweet Vernal  Negative    11. Timothy  Negative    12. Cocklebur  Negative    13.  Burweed Marshelder  Negative    14. Ragweed, short  Negative    15. Ragweed, Giant  Negative    16. Plantain,  English  Negative    17. Lamb's Quarters  Negative    18. Sheep Sorrell  Negative    19. Rough Pigweed  Negative    20. Marsh Elder, Rough  Negative    21. Mugwort, Common  Negative    22. Ash mix  Negative    23. Birch mix  Negative    24. Beech American  Negative    25. Box, Elder  Negative    26. Cedar, red  Negative    27. Cottonwood, [place]  Negative    28. Elm mix  Negative    29. Hickory mix  Negative  30. Maple mix  Negative    31. Oak, [place] mix  Negative    32. Pecan Pollen  Negative    33. Pine mix  Negative    34. Sycamore Eastern  Negative    35. Gaston, Black Pollen  Negative    36. Alternaria alternata  Negative    37. Cladosporium Herbarum  Negative    38. Aspergillus mix  Negative    39. Penicillium mix  Negative    40. Bipolaris sorokiniana (Helminthosporium)  Negative    41. Drechslera spicifera (Curvularia)  Negative    42. Mucor plumbeus  Negative    43. Fusarium moniliforme  Negative    44. Aureobasidium pullulans (pullulara)  Negative    45. Rhizopus oryzae  Negative    46. Botrytis cinera  Negative    47. Epicoccum nigrum  Negative    48. Phoma betae  Negative    49. Candida Albicans  Negative    50. Trichophyton mentagrophytes  Negative    51. Mite, D Farinae  5,000 AU/ml  Negative    52. Mite, D Pteronyssinus  5,000 AU/ml  Negative    53. Cat Hair 10,000 BAU/ml  Negative    54.  Dog Epithelia  Negative    55. Mixed Feathers  Negative    56. Horse Epithelia  Negative    57. Cockroach, German  Negative    58. Mouse  Negative    59. Tobacco Leaf  Negative     Food Adult Perc - 10/23/19 1000    Time Antigen Placed  1037    Allergen Manufacturer  Lavella Hammock    Location  Back    Number of allergen test  72    1. Peanut  Negative    2. Soybean  Negative    3. Wheat  Negative    4. Sesame  Negative    5. Milk, cow  Negative    6.  Egg White, Chicken  Negative    7. Casein  Negative    8. Shellfish Mix  Negative    9. Fish Mix  Negative    10. Cashew  Negative    11. Pecan Food  Negative    12. [place]  Negative    13. Almond  Negative    14. Hazelnut  Negative    15. [place] nut  Negative    16. Coconut  Negative    17. Pistachio  Negative    18. Catfish  Negative    19. Bass  Negative    20. Trout  Negative    21. Tuna  Negative    22. Salmon  Negative    23. Flounder  Negative    24. Codfish  Negative    25. Shrimp  Negative    26. Crab  Negative    27. Lobster  Negative    28. Oyster  Negative    29. Scallops  Negative    30. Barley  Negative    31. Oat   Negative    32. Rye   Negative    33. Hops  Negative    34. Rice  Negative    35. Cottonseed  Negative    36. Saccharomyces Cerevisiae   Negative    37. Pork  Negative    38. [place] Meat  Negative    39. Chicken Meat  Negative    40. Beef  Negative    41. Lamb  Negative    42. Tomato  Negative    43. White Potato  Negative    44. Sweet Potato  Negative    45. Pea, Green/English  Negative    46. Navy Bean  Negative    47. Mushrooms  Negative    48. Avocado  Negative    49. Onion  Negative    50. Cabbage  Negative    51. Carrots  Negative    52. Celery  Negative    53. Corn  Negative    54. Cucumber  Negative    55. Grape (White seedless)  Negative    56. Orange   Negative    57. Banana  Negative    58. Apple  Negative    59. Peach  Negative    60. Strawberry  Negative    61. Cantaloupe  Negative    62. Watermelon  Negative    63. Pineapple  Negative    64. Chocolate/Cacao bean  Negative    65. Karaya Gum  Negative    66. Acacia (Arabic Gum)  Negative    67. Cinnamon  Negative    68. Nutmeg  Negative    69. Ginger  Negative    70. Garlic  Negative    71. Pepper, black  Negative    72. Mustard  Negative       Allergy testing results were read and interpreted by myself, documented by clinical staff.      Salvatore Marvel, MD  [facility]

## 2019-10-23 NOTE — Patient Instructions (Addendum)
1. Acute urticaria - All of the foods were negative. - All of the environmental allergens were negative. - Copies provided. - Restart your Zyrtec (cetirizine) one or two times daily. - Introduce wheat back into your diet and wait a couple of days. - Introduce peanuts back into your diet and wait a couple of days. - Do these for two weeks and introduce egg back into your diet and wait a couple of days. - Then introduce milk into your diet and wait a couple of days.  - Call us with updates.  2. Elevated ANA  - Rheumatology appointment pending in July 2021.  3. Return in about 6 weeks (around 12/04/2019). This can be an in-person, a virtual Webex or a telephone follow up visit.   Please inform us of any Emergency Department visits, hospitalizations, or changes in symptoms. Call us before going to the ED for breathing or allergy symptoms since we might be able to fit you in for a sick visit. Feel free to contact us anytime with any questions, problems, or concerns.  It was a pleasure to see you again today!  Websites that have reliable patient information: 1. American Academy of Asthma, Allergy, and Immunology: www.aaaai.org 2. Food Allergy Research and Education (FARE): foodallergy.org 3. Mothers of Asthmatics: http://www.asthmacommunitynetwork.org 4. American College of Allergy, Asthma, and Immunology: www.acaai.org   COVID-19 Vaccine Information can be found at: ShippingScam.co.uk For questions related to vaccine distribution or appointments, please email [email or web address] or call [phone].     "Like" [place] on Facebook and Instagram for our latest updates!       HAPPY SPRING!  Make sure you are registered to vote! If you have moved or changed any of your contact information, you will need to get this updated before voting!  In some cases, you MAY be able to register to vote online:  [email or web address]

## 2019-10-24 ENCOUNTER — Encounter: Payer: Self-pay | Admitting: Allergy & Immunology

## 2019-11-03 ENCOUNTER — Ambulatory Visit
Admission: RE | Admit: 2019-11-03 | Discharge: 2019-11-03 | Disposition: A | Discharge status: Home / Self Care | Payer: Medicare Other | Source: Ambulatory Visit | Attending: Family Medicine | Admitting: Family Medicine

## 2019-11-03 ENCOUNTER — Other Ambulatory Visit: Payer: Self-pay

## 2019-11-03 DIAGNOSIS — M8589 Other specified disorders of bone density and structure, multiple sites: Secondary | ICD-10-CM | POA: Diagnosis not present

## 2019-11-03 DIAGNOSIS — Z780 Asymptomatic menopausal state: Secondary | ICD-10-CM | POA: Diagnosis not present

## 2019-11-03 DIAGNOSIS — Z1231 Encounter for screening mammogram for malignant neoplasm of breast: Secondary | ICD-10-CM | POA: Diagnosis not present

## 2019-11-03 DIAGNOSIS — E2839 Other primary ovarian failure: Secondary | ICD-10-CM

## 2019-11-09 ENCOUNTER — Other Ambulatory Visit: Payer: Self-pay

## 2019-11-09 ENCOUNTER — Other Ambulatory Visit: Payer: Self-pay | Admitting: Family Medicine

## 2019-11-09 ENCOUNTER — Encounter: Payer: Self-pay | Admitting: Family Medicine

## 2019-11-09 ENCOUNTER — Ambulatory Visit (INDEPENDENT_AMBULATORY_CARE_PROVIDER_SITE_OTHER): Payer: Medicare Other | Admitting: Family Medicine

## 2019-11-09 VITALS — BP 118/60 | HR 93 | Temp 97.9°F | Resp 18 | Ht 62.0 in

## 2019-11-09 DIAGNOSIS — R768 Other specified abnormal immunological findings in serum: Secondary | ICD-10-CM

## 2019-11-09 DIAGNOSIS — L508 Other urticaria: Secondary | ICD-10-CM

## 2019-11-09 MED ORDER — CETIRIZINE HCL 5 MG PO TABS: 5.0000 mg | ORAL_TABLET | Freq: Every day | ORAL | 2 refills | Status: DC

## 2019-11-09 MED ORDER — FAMOTIDINE 20 MG PO TABS: 20.0000 mg | ORAL_TABLET | Freq: Two times a day (BID) | ORAL | 2 refills | Status: DC | PRN

## 2019-11-09 NOTE — Patient Instructions (Signed)
Hives (urticaria)  . Cetirizine (Zyrtec) 10mg  twice a day and famotidine (Pepcid) 20 mg twice a day. If no symptoms for 7-14 days then decrease to. . Cetirizine (Zyrtec) 10mg  twice a day and famotidine (Pepcid) 20 mg once a day.  If no symptoms for 7-14 days then decrease to. . Cetirizine (Zyrtec) 10mg  twice a day.  If no symptoms for 7-14 days then decrease to. . Cetirizine (Zyrtec) 10mg  once a day.  May use Benadryl (diphenhydramine) as needed for breakthrough hives       If symptoms return, then step up dosage  Keep a detailed symptom journal including foods eaten, contact with allergens, medications taken, weather changes.   Call the clinic if this treatment plan is not working well for you  Follow up on 12/11/2019 at the previously scheduled appointment with Dr. Ernst Bowler or sooner if needed.

## 2019-11-09 NOTE — Progress Notes (Signed)
[address], [address]  [place] [address] Dept: [phone]  FOLLOW UP NOTE  Patient ID: [name], female    DOB: 1945-10-30  Age: 74 y.o. MRN: [id number] Date of Office Visit: 11/09/2019  Assessment  Chief Complaint: Urticaria  HPI [name] is a 74 year old female who presents to the clinic for an evaluation of urticaria that began on Friday and has mainly occurred on her arms and back.  She reports that the raised red itchy areas first began to appear on her arms and then resolved quickly, however, they appeared again and moved around to her back.  She is currently taking Benadryl in the morning and in the evening since Friday and using an over-the-counter antiitch cream.  She denies new medications, personal care products, foods, or soaps.  She denies cardiopulmonary or gastrointestinal symptoms related to this rash.  She has not added wheat, peanut, egg, or milk back into her diet at this point.  She denies eye or lip swelling.  There is no increased stress, fever, muscle or joint pain.  She reports the hives healed without leaving residual markings.  She has not tried an over-the-counter antihistamine.  She has an appointment with her rheumatologist in June.  Her current medications are listed in the chart.   Drug Allergies:  Allergies  Allergen Reactions  . Levofloxacin   . Shellfish Allergy     Physical Exam: BP 118/60   Pulse 93   Temp 97.9 F (36.6 C) (Temporal)   Resp 18   Ht 5\' 2"  (1.575 m)   SpO2 98%   BMI 25.24 kg/m    Physical Exam Vitals reviewed.  Constitutional:      Appearance: Normal appearance.  HENT:     Head: Normocephalic and atraumatic.     Right Ear: Tympanic membrane normal.     Left Ear: Tympanic membrane normal.     Nose:     Comments: Bilateral nares slightly erythematous with clear nasal drainage.  Pharynx normal.  Ears normal.  Eyes normal.    Mouth/Throat:     Pharynx: Oropharynx is clear.  Eyes:   Conjunctiva/sclera: Conjunctivae normal.  Cardiovascular:     Rate and Rhythm: Normal rate and regular rhythm.     Heart sounds: Normal heart sounds. No murmur.  Pulmonary:     Effort: Pulmonary effort is normal.     Breath sounds: Normal breath sounds.     Comments: Lungs clear to auscultation Musculoskeletal:        General: Normal range of motion.     Cervical back: Normal range of motion and neck supple.  Skin:    General: Skin is warm and dry.  Neurological:     Mental Status: She is alert and oriented to person, place, and time.  Psychiatric:        Mood and Affect: Mood normal.        Behavior: Behavior normal.        Thought Content: Thought content normal.        Judgment: Judgment normal.     Assessment and Plan: 1. Acute urticaria   2. Elevated antinuclear antibody (ANA) level     Meds ordered this encounter  Medications  . cetirizine (ZYRTEC) 5 MG tablet    Sig: Take 1 tablet (5 mg total) by mouth daily.    Dispense:  31 tablet    Refill:  2  . famotidine (PEPCID) 20 MG tablet    Sig: Take 1 tablet (20  mg total) by mouth 2 (two) times daily as needed (hives or itch).    Dispense:  31 tablet    Refill:  2    Patient Instructions  Hives (urticaria)  . Cetirizine (Zyrtec) 10mg  twice a day and famotidine (Pepcid) 20 mg twice a day. If no symptoms for 7-14 days then decrease to. . Cetirizine (Zyrtec) 10mg  twice a day and famotidine (Pepcid) 20 mg once a day.  If no symptoms for 7-14 days then decrease to. . Cetirizine (Zyrtec) 10mg  twice a day.  If no symptoms for 7-14 days then decrease to. . Cetirizine (Zyrtec) 10mg  once a day.  May use Benadryl (diphenhydramine) as needed for breakthrough hives       If symptoms return, then step up dosage  Keep a detailed symptom journal including foods eaten, contact with allergens, medications taken, weather changes.   Call the clinic if this treatment plan is not working well for you  Follow up on 12/11/2019 at the  previously scheduled appointment with Dr. Ernst Bowler or sooner if needed.    Return in about 1 month (around 12/11/2019), or if symptoms worsen or fail to improve.    Thank you for the opportunity to care for this patient.  Please do not hesitate to contact me with questions.  Gareth Morgan, FNP [facility]

## 2019-11-12 ENCOUNTER — Telehealth: Payer: Self-pay | Admitting: *Deleted

## 2019-11-12 MED ORDER — PREDNISONE 10 MG PO TABS: ORAL_TABLET | ORAL | 0 refills | Status: DC

## 2019-11-12 NOTE — Telephone Encounter (Signed)
Prescription has been sent to pharmacy. Called patient and informed. Patient verbalized understanding. A handout for Xolair has been mailed to the patient's home for the patient to look over. Patient verbalized understanding and will look over the material as well.

## 2019-11-12 NOTE — Addendum Note (Signed)
Addended by: Chip Boer R on: 11/12/2019 09:16 AM   Modules accepted: Orders

## 2019-11-12 NOTE — Telephone Encounter (Signed)
Please have her continue the H1H2 regimen and add on prednisone 10 mg tablets take 2 tablets once a day for 4 days, then take 1 tablet on the 5th day, then stop. Please introduce the possibility of Xolair injection, however, we will wait until she has completed her evaluation by rheumatology to start this if she is interested. Please send written info on Xolair. Thank you

## 2019-11-12 NOTE — Telephone Encounter (Signed)
Patient called this morning and states that she is still having issues with her hives. She states that she is still having hives and a lot of discomfort. She states that she is still taking the Zyrtec BID and Pepcid BID and she had to take 5 Benadryl yesterday through out the day. She stated that she has already taken 2 Benadryl this morning and she is still not having relief. Please advise further recommendation for the patient. Thank You.

## 2019-11-14 ENCOUNTER — Encounter (HOSPITAL_COMMUNITY): Payer: Self-pay | Admitting: Emergency Medicine

## 2019-11-14 ENCOUNTER — Emergency Department (HOSPITAL_COMMUNITY)
Admission: EM | Admit: 2019-11-14 | Discharge: 2019-11-14 | Disposition: A | Discharge status: Home / Self Care | Payer: Medicare Other | Attending: Emergency Medicine | Admitting: Emergency Medicine

## 2019-11-14 ENCOUNTER — Other Ambulatory Visit: Payer: Self-pay

## 2019-11-14 DIAGNOSIS — T7840XA Allergy, unspecified, initial encounter: Secondary | ICD-10-CM | POA: Diagnosis not present

## 2019-11-14 DIAGNOSIS — Z79899 Other long term (current) drug therapy: Secondary | ICD-10-CM | POA: Insufficient documentation

## 2019-11-14 DIAGNOSIS — L509 Urticaria, unspecified: Secondary | ICD-10-CM | POA: Diagnosis present

## 2019-11-14 DIAGNOSIS — E039 Hypothyroidism, unspecified: Secondary | ICD-10-CM | POA: Diagnosis not present

## 2019-11-14 DIAGNOSIS — Z7982 Long term (current) use of aspirin: Secondary | ICD-10-CM | POA: Insufficient documentation

## 2019-11-14 MED ORDER — PREDNISONE 10 MG PO TABS: 40.0000 mg | ORAL_TABLET | Freq: Every day | ORAL | 0 refills | Status: DC

## 2019-11-14 MED ORDER — PREDNISONE 20 MG PO TABS
40.0000 mg | ORAL_TABLET | Freq: Once | ORAL | Status: AC
  Administered 2019-11-14: 40 mg via ORAL
  Filled 2019-11-14: qty 2

## 2019-11-14 NOTE — ED Provider Notes (Addendum)
[facility] Provider Note   CSN: [id number] Arrival date & time: 11/14/19  0815     History Chief Complaint  Patient presents with  . Allergic Reaction    [name] is a 74 y.o. female.  Patient is being followed by an allergist had extensive skin testing which was all negative for a month of hives.  Woke up this morning feeling itchy on her back noticed that her lower lip was swollen and she had some swelling to her tongue but just on the 1 side.  Little difficulty swallowing but feels better now.  She took her normal medications that they have her on for these hives which is Pepcid and Zyrtec.  Patient already still on 20 mg of prednisone a day.  Patient still followed by allergist are trying to work-up and determine what the causes.  But her skin testing apparently was negative.  Patient denies any trouble breathing.  Patient able to talk fine.  No real change in voice.        Past Medical History:  Diagnosis Date  . Allergy    RHINITIS  . Osteoporosis    OSTEOPENIA  . Thyroid disease    HYPOTHYROID  . Urticaria     Patient Active Problem List   Diagnosis Date Noted  . Acute urticaria 10/19/2019  . Elevated antinuclear antibody (ANA) level 10/19/2019  . 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 S/P cataract surgery 09/18/2018  . Vitiligo 04/07/2015  . Hypothyroid 05/29/2011  . Allergic rhinitis due to pollen 05/29/2011  . Osteopenia 05/29/2011    Past Surgical History:  Procedure Laterality Date  . APPENDECTOMY    . arm surgery Right   . CHOLECYSTECTOMY    . SINOSCOPY    . SKIN BIOPSY Left 02/28/2018   shave lower left back neurofibroma     OB History   No obstetric history on file.     Family History  Problem Relation Age of Onset  . Stroke Mother   . Arthritis Mother   . Allergic rhinitis Mother   . Allergic rhinitis Brother   . Allergic rhinitis Maternal Grandmother   . Asthma Neg Hx   . Eczema Neg Hx   .  Urticaria Neg Hx     Social History   Tobacco Use  . Smoking status: Never Smoker  . Smokeless tobacco: Never Used  Substance Use Topics  . Alcohol use: No  . Drug use: No    Home Medications Prior to Admission medications   Medication Sig Start Date End Date Taking? Authorizing Provider  aspirin 81 MG tablet Take 81 mg by mouth daily.      [provider]  Calcium Citrate (CITRACAL PO) Take 1,000 mg by mouth.    [provider]  cetirizine (ZYRTEC) 5 MG tablet Take 1 tablet (5 mg total) by mouth daily. 11/09/19   Dara Hoyer, FNP  EPINEPHrine 0.3 mg/0.3 mL IJ SOAJ injection Inject 0.3 mLs (0.3 mg total) into the muscle as needed for anaphylaxis. 08/17/19   Rita Ohara, MD  famotidine (PEPCID) 20 MG tablet Take 1 tablet (20 mg total) by mouth 2 (two) times daily as needed (hives or itch). 11/09/19   Dara Hoyer, FNP  FIBER PO Take by mouth.    [provider]  levothyroxine (SYNTHROID) 100 MCG tablet TAKE 1 TABLET(100 MCG) BY MOUTH DAILY 10/07/19   Denita Lung, MD  Multiple Vitamins-Minerals (MULTIVITAMIN WITH MINERALS) tablet Take 1  tablet by mouth daily.    [provider]  predniSONE (DELTASONE) 10 MG tablet Take 2 tablets daily for 4 days, Take one tablet on the fifth day. 11/12/19   Dara Hoyer, FNP  predniSONE (DELTASONE) 10 MG tablet Take 4 tablets (40 mg total) by mouth daily. 11/14/19   Fredia Sorrow, MD    Allergies    Levofloxacin and Shellfish allergy  Review of Systems   Review of Systems  Constitutional: Negative for chills and fever.  HENT: Positive for trouble swallowing. Negative for facial swelling, rhinorrhea, sore throat and voice change.   Eyes: Negative for visual disturbance.  Respiratory: Negative for cough, shortness of breath and wheezing.   Cardiovascular: Negative for chest pain and leg swelling.  Gastrointestinal: Negative for abdominal pain, diarrhea, nausea and vomiting.  Genitourinary: Negative for dysuria.    Musculoskeletal: Negative for back pain and neck pain.  Skin: Positive for rash.  Neurological: Negative for dizziness, light-headedness and headaches.  Hematological: Does not bruise/bleed easily.  Psychiatric/Behavioral: Negative for confusion.    Physical Exam Updated Vital Signs BP (!) 153/67 (BP Location: Left Arm)   Pulse 74   Temp 97.8 F (36.6 C) (Oral)   Resp 18   Ht 1.575 m (5\' 2" )   Wt 62 kg   SpO2 98%   BMI 25.00 kg/m   Physical Exam Vitals and nursing note reviewed.  Constitutional:      General: She is not in acute distress.    Appearance: Normal appearance. She is well-developed.  HENT:     Head: Normocephalic and atraumatic.     Comments: No facial swelling    Mouth/Throat:     Mouth: Mucous membranes are moist.     Pharynx: Oropharynx is clear.     Comments: Patient with some slight swelling to the left lower lip.  The right side of the tongue is swollen.  Left side is not.  No facial swelling Eyes:     Conjunctiva/sclera: Conjunctivae normal.     Pupils: Pupils are equal, round, and reactive to light.  Cardiovascular:     Rate and Rhythm: Normal rate and regular rhythm.     Heart sounds: No murmur.  Pulmonary:     Effort: Pulmonary effort is normal. No respiratory distress.     Breath sounds: Normal breath sounds.  Abdominal:     Palpations: Abdomen is soft.     Tenderness: There is no abdominal tenderness.  Musculoskeletal:        General: No swelling. Normal range of motion.     Cervical back: Normal range of motion and neck supple.  Skin:    General: Skin is warm and dry.     Findings: No erythema or rash.  Neurological:     General: No focal deficit present.     Mental Status: She is alert and oriented to person, place, and time.     ED Results / Procedures / Treatments   Labs (all labs ordered are listed, but only abnormal results are displayed) Labs Reviewed - No data to display  EKG None  Radiology No results  found.  Procedures Procedures (including critical care time)  Medications Ordered in ED Medications  predniSONE (DELTASONE) tablet 40 mg (40 mg Oral Given 11/14/19 [id number])    ED Course  I have reviewed the triage vital signs and the nursing notes.  Pertinent labs & imaging results that were available during my care of the patient were reviewed by me and considered in  my medical decision making (see chart for details).    MDM Rules/Calculators/A&P                       Patient still has swelling to the right side of her tongue.  Slight swelling to the left side of the lower lip.  No hives no rash no difficulty speaking.  No wheezing.  Patient is now been struggling with urticarial type rash for about a month.  Seen an allergist.  Had extensive testing no triggers have been identified so far.  Patient is currently on 20 mg of prednisone a day as well as Zyrtec and Pepcid.  Patient does have EpiPen's at home.  Recommended following up with her dermatologist as well and her allergist.  This may be a nonallergy based symptoms.  Patient's not on any ACE inhibitors although.  Think patient stable for discharge home we will increase her prednisone to 40 mg a day for 5 days.  And have her continue her Zyrtec and Pepcid.  Patient nontoxic no acute distress.   Final Clinical Impression(s) / ED Diagnoses Final diagnoses:  Allergic reaction, initial encounter    Rx / DC Orders ED Discharge Orders         Ordered    predniSONE (DELTASONE) 10 MG tablet  Daily     11/14/19 0951           Fredia Sorrow, MD 11/14/19 [id number]    Fredia Sorrow, MD 11/14/19 [phone]

## 2019-11-14 NOTE — ED Triage Notes (Signed)
Tongue and bottom lip swollen since she woke up this morning.  Pt has been having hives x 1 month. Giving prednisone by pcp a few days ago.

## 2019-11-14 NOTE — Discharge Instructions (Signed)
As we discussed it is possible that your symptoms may be nonallergic in nature.  But take the increased dose of prednisone for the next 5 days.  IV 40 mg for 5 days starting tomorrow.  Continue to take your Zyrtec and to take your Pepcid.  Would recommend following up both with your allergist and your dermatologist.  If you feel as if your throat is closing off for the tongue swelling gets significantly worse use your EpiPen and call 911.

## 2019-11-16 ENCOUNTER — Other Ambulatory Visit: Payer: Self-pay

## 2019-11-16 ENCOUNTER — Ambulatory Visit (INDEPENDENT_AMBULATORY_CARE_PROVIDER_SITE_OTHER): Payer: Medicare Other | Admitting: Family Medicine

## 2019-11-16 ENCOUNTER — Encounter: Payer: Self-pay | Admitting: Family Medicine

## 2019-11-16 VITALS — BP 148/82 | HR 75 | Temp 97.7°F | Wt 139.8 lb

## 2019-11-16 DIAGNOSIS — T783XXA Angioneurotic edema, initial encounter: Secondary | ICD-10-CM | POA: Diagnosis not present

## 2019-11-16 NOTE — Patient Instructions (Signed)
Finish off the steroids.  Avoid any red meats for now

## 2019-11-16 NOTE — Progress Notes (Signed)
   Subjective:    Patient ID: [name], female    DOB: [birth year], 74 y.o.   MRN: [id number]  HPI She is here for recheck after recent emergency room visit for evaluation of angioedema.  She has been seen by an allergist and tests were done. One the findings was positive ANA and also TPO.  Her thyroid function was normal.  In the emergency room they again gave her prednisone and she is now on 40 mg daily for the next several days.  Of note is the fact that she did have a hot dog and then the next day noted the swelling of her tongue.   Review of Systems     Objective:   Physical Exam Alert and in no distress.  No edema is noted.       Assessment & Plan:  Angioedema, initial encounter - Plan: Alpha-Gal Panel This certainly could be alpha gal and I will therefore check it.  I discussed the positive ANA with her and also mentioned the thyroid.  She is apparently scheduled with rheumatology so recommend she keep that appointment for the time being however if she is alpha gal positive then I think that needs to be addressed more thoroughly.  She was comfortable with that.

## 2019-11-19 LAB — ALPHA-GAL PANEL
Alpha Gal IgE*: <0.1 kU/L (ref <0.10)
Beef (Bos spp) IgE: <0.1 kU/L (ref <0.35)
Class Interpretation: 0
Class Interpretation: 0
Class Interpretation: 0
Lamb/Mutton (Ovis spp) IgE: <0.1 kU/L (ref <0.35)
Pork (Sus spp) IgE: <0.1 kU/L (ref <0.35)

## 2019-11-20 ENCOUNTER — Telehealth: Payer: Self-pay

## 2019-11-20 ENCOUNTER — Ambulatory Visit (INDEPENDENT_AMBULATORY_CARE_PROVIDER_SITE_OTHER): Payer: Medicare Other | Admitting: Allergy & Immunology

## 2019-11-20 ENCOUNTER — Encounter: Payer: Self-pay | Admitting: Allergy & Immunology

## 2019-11-20 ENCOUNTER — Other Ambulatory Visit: Payer: Self-pay

## 2019-11-20 VITALS — BP 138/80 | HR 58 | Temp 98.7°F | Resp 16 | Wt 137.0 lb

## 2019-11-20 DIAGNOSIS — Q383 Other congenital malformations of tongue: Secondary | ICD-10-CM

## 2019-11-20 DIAGNOSIS — L501 Idiopathic urticaria: Secondary | ICD-10-CM

## 2019-11-20 DIAGNOSIS — R768 Other specified abnormal immunological findings in serum: Secondary | ICD-10-CM | POA: Diagnosis not present

## 2019-11-20 NOTE — Telephone Encounter (Signed)
Patient called back requesting a referral to the ENT. Dr Ernst Bowler would should I refer her for?  Thanks

## 2019-11-20 NOTE — Telephone Encounter (Signed)
I called the patient to check and see if she wanted to come in and see Dr Ernst Bowler or Webb Silversmith sooner than her visit that is on file for 5/28. Patient states she is going to follow up with her PCP today because they did some more blood work. She states if her pcp recommends seeing [place], she will schedule for a sooner date. Patient states she is very frustrated with taking prednisone. She no longer wants to keep taking any prednisone. As she has been on prednisone for 4 months.   Will wait for the patient to give me a ring back to schedule.

## 2019-11-20 NOTE — Patient Instructions (Addendum)
1. Chronic urticaria with recurrent anaphylaxis episodes - We are going to increase the cetirizine and famotidine to twice daily for both. - Consider starting Xolair monthly to help decrease these allergic reactions. - I do not think these are related to foods since you were having the hives DESPITE your very limited diet. - Go ahead and add foods to your diet as tolerated.  - Consider referral to [facility] or [facility] for a second opinion.     2. Elevated ANA  - Rheumatology appointment pending in July 2021.  3. Return in about 4 weeks (around 12/18/2019). This can be an in-person, a virtual Webex or a telephone follow up visit.   Please inform us of any Emergency Department visits, hospitalizations, or changes in symptoms. Call us before going to the ED for breathing or allergy symptoms since we might be able to fit you in for a sick visit. Feel free to contact us anytime with any questions, problems, or concerns.  It was a pleasure to see you again today!  Websites that have reliable patient information: 1. American Academy of Asthma, Allergy, and Immunology: www.aaaai.org 2. Food Allergy Research and Education (FARE): foodallergy.org 3. Mothers of Asthmatics: http://www.asthmacommunitynetwork.org 4. American College of Allergy, Asthma, and Immunology: www.acaai.org   COVID-19 Vaccine Information can be found at: ShippingScam.co.uk For questions related to vaccine distribution or appointments, please email vaccine@[facility] .com or call [phone].     "Like" [place] on Facebook and Instagram for our latest updates!       HAPPY SPRING!  Make sure you are registered to vote! If you have moved or changed any of your contact information, you will need to get this updated before voting!  In some cases, you MAY be able to register to vote online: [email or web address]

## 2019-11-20 NOTE — Progress Notes (Signed)
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 She is severely restricting her diet with no improvement in her urticaria with angioedema episodes. It turns out that she is NOT using BID antihistamines, despite our recommendations at past visits. We emphasized the need to use antihistamines BID in order to avoid further prednisone courses. She tells [place] that she is having irritability from the steroids and does not want to take these again. She is agitated that she is "always given steroids". We discussed using Xolair again today but she tells me that she read that this causes her "heart to stop". I am assuming she means anaphylaxis but I am not sure. I told her that we rarely see anaphylaxis from Xolair and we monitor after reach injection just to make sure. She is not interested in this at all. I also tried to reassure her that the foods are not involved since she continues to have urticaria despite avoiding a multitude of foods. She seems to understand this and will try to introduce foods at home. I think taking her antihistamines twice daily will help with this. She did call back later that day requesting an ENT referral, which we will place for her tongue issue. We can consider an RD referral at the next visit if needed, although at this point she has lost only 4 pounds or so.   Plan/Recommendations:   1. Chronic urticaria with recurrent anaphylaxis episodes - We are going to increase the cetirizine and famotidine to twice daily for both. - Consider starting Xolair monthly to help decrease these allergic reactions. - I do not think these are related to foods since you were having the  hives DESPITE your very limited diet. - Go ahead and add foods to your diet as tolerated.  - Consider referral to [facility] or [facility] for a second opinion.     2. Elevated ANA  - Rheumatology appointment pending in July 2021.  3. Return in about 4 weeks (around 12/18/2019). This can be an in-person, a virtual Webex or a telephone follow up visit.   Subjective:   [name] is a 74 y.o. female presenting today for follow up of  Chief Complaint  Patient presents with  . Urticaria    states she is very frustrated. seen by her pcp Monday. alpha-gal was negative. she does not wish to take prednisone any more due to how it makes her feel. she is anxious, jittery, weight gain, puffy eyes and face. no hives. she is having episodes of tongue and lip swelling. her tongue feels like it has been burnt.     [name] has a history of the following: Patient Active Problem List   Diagnosis Date Noted  . Chronic idiopathic urticaria 11/23/2019  . Acute urticaria 10/19/2019  . Elevated antinuclear antibody (ANA) level 10/19/2019  . 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 S/P cataract surgery 09/18/2018  . Vitiligo 04/07/2015  . Hypothyroid 05/29/2011  . Allergic rhinitis due to pollen 05/29/2011  . Osteopenia 05/29/2011    History obtained from: chart review and patient.  [name] is a 74 y.o. female presenting for a follow up visit. She has a history of CIU with  concerns for anaphylaxis to foods. However, she underwent testing in early April and both the environmental and food panels were completely negative. We have put her on suppressive dosing of antihistamines and discussed starting Xolair in the past. She talked about Xolair with our NP at the end of April more extensively.   In the interim, she has continued to have issues. She reports that this past Saturday she went to the Emergency Room for hives and lower lip/tongue swelling. She has had hives off/on since February. She was  given prednisone at the ER and  finished this yesterday. Currently she is hive free. She takes Zyrtec 10 mg once a day and famotidine 20 mg once a day. She does not want to take any more prednisone due to personality changes, making her nervous, hands shaking, and feeling like she is going to jump out of her skin. She is reluctant at this time to start [place]. She reports that she is scared to eat and does not know what to eat. This past week she has had oatmeal and orange juice for breakfast, banana sandwich for lunch and chicken and squash for lunch. She recently stopped aspirin 81 mg and fluoride tooth paste to see if this was the cause of her hives. Her primary care physician checked her alpha gal level and she reports it is negative.  She also reports that tip of her tongue all throughout the middle feels burnt. At this time she does not want a referral to ENT. She is unsure where this burnt sensation is coming from. She is not interested in getting a referral to [facility] or [facility] for evaluation of her reactions.   Otherwise, there have been no changes to her past medical history, surgical history, family history, or social history.    Review of Systems  Constitutional: Negative for chills and fever.  HENT: Negative.   Eyes: Negative.   Respiratory: Negative.   Skin: Negative for itching and rash.       Objective:   Blood pressure 138/80, pulse (!) 58, temperature 98.7 F (37.1 C), temperature source Temporal, resp. rate 16, weight 137 lb (62.1 kg), SpO2 99 %. Body mass index is 25.06 kg/m.   Physical Exam:  Physical Exam  Vitals reviewed. Constitutional: She is oriented to person, place, and time. She appears well-developed and well-nourished.  HENT:  Head: Normocephalic and atraumatic.  Right Ear: External ear normal.  Left Ear: External ear normal.  Nose: Nose normal.  Mouth/Throat: Oropharynx is clear and moist.  Tongue: midline papillae erythematous  Eyes: Conjunctivae are  normal.  Cardiovascular: Normal rate, regular rhythm and normal heart sounds.  Respiratory: Effort normal and breath sounds normal.  Lungs clear to auscultation  Neurological: She is alert and oriented to person, place, and time.  Skin: Skin is warm.  No hives noted  Psychiatric: Judgment and thought content normal.     I performed a history and physical examination of the patient and discussed her management with the Nurse Practitioner. I reviewed the Nurse Practitioner's note and agree with the documented findings and plan of care. The note in its entirety was edited by myself, including the physical exam, assessment, and plan.    Salvatore Marvel, MD Allergy and [place] [facility] [place]

## 2019-11-23 ENCOUNTER — Encounter: Payer: Self-pay | Admitting: Allergy & Immunology

## 2019-11-23 DIAGNOSIS — L501 Idiopathic urticaria: Secondary | ICD-10-CM | POA: Insufficient documentation

## 2019-11-23 NOTE — Telephone Encounter (Signed)
Patient has been informed about the referral. I have given her Dr Jennell Corner office number to contact for scheduling.   Thanks

## 2019-11-23 NOTE — Telephone Encounter (Signed)
I think I routed her office visit note to with the details of the referral.   Salvatore Marvel, MD Allergy and [place] [facility]

## 2019-11-27 ENCOUNTER — Ambulatory Visit (INDEPENDENT_AMBULATORY_CARE_PROVIDER_SITE_OTHER): Payer: Medicare Other | Admitting: Otolaryngology

## 2019-11-27 ENCOUNTER — Encounter (INDEPENDENT_AMBULATORY_CARE_PROVIDER_SITE_OTHER): Payer: Self-pay | Admitting: Otolaryngology

## 2019-11-27 ENCOUNTER — Other Ambulatory Visit: Payer: Self-pay

## 2019-11-27 VITALS — Temp 97.7°F

## 2019-11-27 DIAGNOSIS — R220 Localized swelling, mass and lump, head: Secondary | ICD-10-CM | POA: Diagnosis not present

## 2019-11-27 NOTE — Progress Notes (Signed)
HPI: [name] is a 74 y.o. female who presents is referred by her PCP for evaluation of intermittent tongue swelling.  Patient relates a lot of her symptoms following her initial Covid vaccine in mid February.  She apparently has developed history of recurrent hives all over her body.  She has had a couple times where her tongue and lips swelled when she woke up in the morning.  She went to the ED one time and was prescribed prednisone.  She has seen allergist with mixed allergy testing but basically did not feel like this was an allergy. She is having no swelling of her tongue today.  No pain.  No hoarseness.  And no trouble swallowing..  Past Medical History:  Diagnosis Date  . Allergy    RHINITIS  . Osteoporosis    OSTEOPENIA  . Thyroid disease    HYPOTHYROID  . Urticaria    Past Surgical History:  Procedure Laterality Date  . APPENDECTOMY    . arm surgery Right   . CHOLECYSTECTOMY    . SINOSCOPY    . SKIN BIOPSY Left 02/28/2018   shave lower left back neurofibroma   Social History   Socioeconomic History  . Marital status: Widowed    Spouse name: Not on file  . Number of children: Not on file  . Years of education: Not on file  . Highest education level: Not on file  Occupational History  . Not on file  Tobacco Use  . Smoking status: Never Smoker  . Smokeless tobacco: Never Used  Substance and Sexual Activity  . Alcohol use: No  . Drug use: No  . Sexual activity: Yes  Other Topics Concern  . Not on file  Social History Narrative  . Not on file   Social Determinants of Health   Financial Resource Strain:   . Difficulty of Paying Living Expenses:   Food Insecurity:   . Worried About Charity fundraiser in the Last Year:   . Arboriculturist in the Last Year:   Transportation Needs:   . Film/video editor (Medical):   Marland Kitchen Lack of Transportation (Non-Medical):   Physical Activity:   . Days of Exercise per Week:   . Minutes of Exercise per Session:    Stress:   . Feeling of Stress :   Social Connections:   . Frequency of Communication with Friends and Family:   . Frequency of Social Gatherings with Friends and Family:   . Attends Religious Services:   . Active Member of Clubs or Organizations:   . Attends Archivist Meetings:   Marland Kitchen Marital Status:    Family History  Problem Relation Age of Onset  . Stroke Mother   . Arthritis Mother   . Allergic rhinitis Mother   . Allergic rhinitis Brother   . Allergic rhinitis Maternal Grandmother   . Asthma Neg Hx   . Eczema Neg Hx   . Urticaria Neg Hx    Allergies  Allergen Reactions  . Levofloxacin   . Shellfish Allergy    Prior to Admission medications   Medication Sig Start Date End Date Taking? Authorizing Provider  aspirin 81 MG tablet Take 81 mg by mouth daily.     Yes [provider]  Calcium Citrate (CITRACAL PO) Take 1,000 mg by mouth.   Yes [provider]  cetirizine (ZYRTEC) 5 MG tablet Take 1 tablet (5 mg total) by mouth daily. 11/09/19  Yes Ambs, Kathrine Cords, FNP  EPINEPHrine 0.3 mg/0.3 mL IJ SOAJ injection Inject 0.3 mLs (0.3 mg total) into the muscle as needed for anaphylaxis. 08/17/19  Yes Rita Ohara, MD  famotidine (PEPCID) 20 MG tablet Take 1 tablet (20 mg total) by mouth 2 (two) times daily as needed (hives or itch). 11/09/19  Yes Ambs, Kathrine Cords, FNP  levothyroxine (SYNTHROID) 100 MCG tablet TAKE 1 TABLET(100 MCG) BY MOUTH DAILY 10/07/19  Yes Denita Lung, MD  Multiple Vitamins-Minerals (MULTIVITAMIN WITH MINERALS) tablet Take 1 tablet by mouth daily.   Yes [provider]     Positive ROS: Otherwise negative  All other systems have been reviewed and were otherwise negative with the exception of those mentioned in the HPI and as above.  Physical Exam: Constitutional: Alert, well-appearing, no acute distress Ears: External ears without lesions or tenderness. Ear canals are clear bilaterally with intact, clear TMs.  Nasal: External nose  without lesions.. Clear nasal passages Oral: Lips and gums without lesions. Tongue and palate mucosa without lesions. Posterior oropharynx clear.  No mucosal abnormalities noted.  Indirect laryngoscopy revealed a clear base of tongue vallecula and epiglottis.  Tongue is normal mobility with no mucosal lesions noted on the dorsum of the tongue Neck: No palpable adenopathy or masses Respiratory: Breathing comfortably  Skin: No facial/neck lesions or rash noted.  Procedures  Assessment: History of tongue and lip swelling questionable etiology.  Plan: Reviewed with her today concerning normal examination.  I cannot definitely determine the cause of her lip or tongue swelling. Discussed with her concerning follow-up here next time she has tongue swelling if it occurs during the day for recheck.  Also reviewed with her concerning reviewing what she ate or took the previous 6 to 10 hours prior to the tongue swelling just in case it is a reaction to something. I would encourage her to take the second vaccine shot.   Radene Journey, MD   CC:

## 2019-12-09 DIAGNOSIS — H02403 Unspecified ptosis of bilateral eyelids: Secondary | ICD-10-CM | POA: Diagnosis not present

## 2019-12-09 DIAGNOSIS — H26493 Other secondary cataract, bilateral: Secondary | ICD-10-CM | POA: Diagnosis not present

## 2019-12-09 DIAGNOSIS — Z961 Presence of intraocular lens: Secondary | ICD-10-CM | POA: Diagnosis not present

## 2019-12-11 ENCOUNTER — Ambulatory Visit (INDEPENDENT_AMBULATORY_CARE_PROVIDER_SITE_OTHER): Payer: Medicare Other | Admitting: Allergy & Immunology

## 2019-12-11 ENCOUNTER — Encounter: Payer: Self-pay | Admitting: Allergy & Immunology

## 2019-12-11 ENCOUNTER — Other Ambulatory Visit: Payer: Self-pay

## 2019-12-11 VITALS — BP 128/74 | HR 77 | Resp 16 | Ht 62.0 in

## 2019-12-11 DIAGNOSIS — L501 Idiopathic urticaria: Secondary | ICD-10-CM

## 2019-12-11 DIAGNOSIS — R768 Other specified abnormal immunological findings in serum: Secondary | ICD-10-CM | POA: Diagnosis not present

## 2019-12-11 MED ORDER — OMALIZUMAB 150 MG ~~LOC~~ SOLR
300.0000 mg | SUBCUTANEOUS | Status: DC
  Administered 2019-12-11 – 2020-04-01 (×5): 300 mg via SUBCUTANEOUS

## 2019-12-11 NOTE — Progress Notes (Signed)
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 Chronic urticaria with recurrent anaphylaxis episodes - Xolair sample given today. - Tammy will reach out to you to discuss the approval process. - In the meantime, continue with cetirizine + famotidine twice daily. - We can wean these over time.  - Consent signed. - EpiPen is up to date.   2. Elevated ANA  - Rheumatology appointment pending in July 2021.  3. Return in about 3 months (around 03/12/2020). This can be an in-person, a virtual Webex or a telephone follow up visit.  Subjective:   [name] is a 74 y.o. female presenting today for follow up of  Chief Complaint  Patient presents with  . Urticaria  . Angioedema    [name] has a history of the following: Patient Active Problem List   Diagnosis Date Noted  . Chronic idiopathic urticaria 11/23/2019  . Acute urticaria 10/19/2019  . Elevated antinuclear antibody (ANA) level 10/19/2019  . 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 S/P cataract surgery 09/18/2018  . Vitiligo 04/07/2015  . Hypothyroid 05/29/2011  . Allergic rhinitis due to pollen 05/29/2011  . Osteopenia 05/29/2011    History obtained from: chart review and patient.  [name] is a 74 y.o. female presenting for a follow up visit.  I last saw her in May 2021.  At that time, she was continuing to have urticaria with angioedema.  He had been on prednisone for a number of weeks at that point, prescribed by both the emergency room and her primary care provider.  She was in quite state and was very tearful and emotional.   We have done an extensive laboratory evaluation that did not show much of anything.  Some of her IgE to foods were positive, although very low.  She had tried avoiding these without any improvement in her symptoms.  We kept her on Zyrtec twice daily and famotidine twice daily.  We did emphasize the need to take this every day to decrease her reactions.  We also referred her to ENT due to perseveration of her perceived tongue swelling and abnormality.  She saw ENT, who was not impressed.  Since last visit, she has mostly done well.  She has remained on her suppressive doses of antihistamines.  However, 2 days ago, she had a swelling on her lip. THis is all she had. The last night after she took her shower, she was having a lot of itching and itching "to death". She describes these as "red welches".  She is open to other ideas today, including Xolair.  We had discussed Xolair extensively in the past but the list of side effects had always prevented her from going through with it.  She asked her daughter to come over this morning to look at these.  Her daughter reminded her that she was on allergy shots when she was younger.  She asked about allergy shots today, but an environmental allergy panel we did via the blood was completely negative.  She is going to be having laser surgery on her left eye in June.  Apparently she has developed a "cloudy membrane" behind  her cataract.    Otherwise, there have been no changes to her past medical history, surgical history, family history, or social history.    Review of Systems  Constitutional: Negative.  Negative for chills, fever, malaise/fatigue and weight loss.  HENT: Negative.  Negative for congestion, ear discharge and ear pain.   Eyes: Negative for pain, discharge and redness.  Respiratory: Negative for cough, sputum production, shortness of breath and wheezing.   Cardiovascular: Negative.  Negative for chest pain and palpitations.  Gastrointestinal:  Negative for abdominal pain, constipation, diarrhea, heartburn, nausea and vomiting.  Skin: Positive for itching and rash.  Neurological: Negative for dizziness and headaches.  Endo/Heme/Allergies: Negative for environmental allergies. Does not bruise/bleed easily.       Objective:   Blood pressure 128/74, pulse 77, resp. rate 16, height 5\' 2"  (1.575 m), SpO2 95 %. Body mass index is 25.06 kg/m.   Physical Exam:  Physical Exam  Constitutional: She appears well-developed.  Pleasant and adorable. Very talkative.   HENT:  Head: Normocephalic and atraumatic.  Right Ear: Tympanic membrane, external ear and ear canal normal.  Left Ear: Tympanic membrane, external ear and ear canal normal.  Nose: Mucosal edema present. No rhinorrhea, nasal deformity or septal deviation. No epistaxis. Right sinus exhibits no maxillary sinus tenderness and no frontal sinus tenderness. Left sinus exhibits no maxillary sinus tenderness and no frontal sinus tenderness.  Mouth/Throat: Uvula is midline and oropharynx is clear and moist. Mucous membranes are not pale and not dry.  Eyes: Pupils are equal, round, and reactive to light. Conjunctivae and EOM are normal. Right eye exhibits no chemosis and no discharge. Left eye exhibits no chemosis and no discharge. Right conjunctiva is not injected. Left conjunctiva is not injected.  Cardiovascular: Normal rate, regular rhythm and normal heart sounds.  Respiratory: Effort normal and breath sounds normal. No accessory muscle usage. No tachypnea. No respiratory distress. She has no wheezes. She has no rhonchi. She has no rales. She exhibits no tenderness.  Moving air well in all lung fields.   Lymphadenopathy:    She has no cervical adenopathy.  Neurological: She is alert.  Skin: No abrasion, no petechiae and no rash noted. Rash is not papular, not vesicular and not urticarial. No erythema. No pallor.  No urticarial lesions noted.   Psychiatric: She has a normal mood  and affect.     Diagnostic studies: none  We did administer Xolair 300mg  in the clinic. We actually administered 0.3 mL of saline first to ensure that she tolerated injections without adverse event. Then we administered the drug without a problem. She was monitored for 30 minutes prior to discharge.      Salvatore Marvel, MD  [facility]

## 2019-12-11 NOTE — Patient Instructions (Addendum)
1. Chronic urticaria with recurrent anaphylaxis episodes - Xolair sample given today. - Tammy will reach out to you to discuss the approval process. - In the meantime, continue with cetirizine + famotidine twice daily. - We can wean these over time.  - Consent signed. - EpiPen is up to date.   2. Elevated ANA  - Rheumatology appointment pending in July 2021.  3. Return in about 3 months (around 03/12/2020). This can be an in-person, a virtual Webex or a telephone follow up visit.   Please inform us of any Emergency Department visits, hospitalizations, or changes in symptoms. Call us before going to the ED for breathing or allergy symptoms since we might be able to fit you in for a sick visit. Feel free to contact us anytime with any questions, problems, or concerns.  It was a pleasure to see you again today!  Websites that have reliable patient information: 1. American Academy of Asthma, Allergy, and Immunology: www.aaaai.org 2. Food Allergy Research and Education (FARE): foodallergy.org 3. Mothers of Asthmatics: http://www.asthmacommunitynetwork.org 4. American College of Allergy, Asthma, and Immunology: www.acaai.org   COVID-19 Vaccine Information can be found at: ShippingScam.co.uk For questions related to vaccine distribution or appointments, please email [email or web address] or call [phone].     "Like" [place] on Facebook and Instagram for our latest updates!       HAPPY SPRING!  Make sure you are registered to vote! If you have moved or changed any of your contact information, you will need to get this updated before voting!  In some cases, you MAY be able to register to vote online: [email or web address]

## 2019-12-14 ENCOUNTER — Other Ambulatory Visit: Payer: Self-pay | Admitting: Allergy

## 2019-12-14 ENCOUNTER — Emergency Department (HOSPITAL_COMMUNITY)
Admission: EM | Admit: 2019-12-14 | Discharge: 2019-12-14 | Disposition: A | Discharge status: Home / Self Care | Payer: Medicare Other | Attending: Emergency Medicine | Admitting: Emergency Medicine

## 2019-12-14 ENCOUNTER — Encounter (HOSPITAL_COMMUNITY): Payer: Self-pay | Admitting: *Deleted

## 2019-12-14 ENCOUNTER — Other Ambulatory Visit: Payer: Self-pay

## 2019-12-14 DIAGNOSIS — Z7982 Long term (current) use of aspirin: Secondary | ICD-10-CM | POA: Insufficient documentation

## 2019-12-14 DIAGNOSIS — E039 Hypothyroidism, unspecified: Secondary | ICD-10-CM | POA: Insufficient documentation

## 2019-12-14 DIAGNOSIS — L299 Pruritus, unspecified: Secondary | ICD-10-CM | POA: Diagnosis present

## 2019-12-14 DIAGNOSIS — L509 Urticaria, unspecified: Secondary | ICD-10-CM | POA: Diagnosis not present

## 2019-12-14 DIAGNOSIS — Z79899 Other long term (current) drug therapy: Secondary | ICD-10-CM | POA: Diagnosis not present

## 2019-12-14 MED ORDER — DIPHENHYDRAMINE HCL 50 MG/ML IJ SOLN
25.0000 mg | Freq: Once | INTRAMUSCULAR | Status: AC
  Administered 2019-12-14: 25 mg via INTRAVENOUS
  Filled 2019-12-14: qty 1

## 2019-12-14 MED ORDER — PREDNISONE 20 MG PO TABS: ORAL_TABLET | ORAL | 0 refills | Status: DC

## 2019-12-14 MED ORDER — DEXAMETHASONE SODIUM PHOSPHATE 10 MG/ML IJ SOLN
10.0000 mg | Freq: Once | INTRAMUSCULAR | Status: AC
  Administered 2019-12-14: 10 mg via INTRAVENOUS
  Filled 2019-12-14: qty 1

## 2019-12-14 NOTE — ED Triage Notes (Signed)
Pt c/o "hives" all over, pt reports that she has been having problems with hives over the past 4 months with no improvement. Was given Xolair 300 mg injection 3 days ago but the hives became worse tonight, denies any sob, denies any issues with swelling, reports " I just feel anxious"

## 2019-12-14 NOTE — ED Provider Notes (Signed)
[facility] EMERGENCY DEPARTMENT Provider Note   CSN: [id number] Arrival date & time: 12/14/19  0346   Time seen 3:57 AM  History Chief Complaint  Patient presents with  . Pruritis    [name] is a 74 y.o. female.  HPI   Patient states she has been having hives off and on for the past 4 months.  She states she is seen an allergist and she got her first injection of Xolair on May 28.  She is being followed by Dr. Ernst Bowler.  She states on the 30 half she started getting hives on her back and on her thigh.  She states she is itching.  She denies any swelling of her lips, or tongue.  She denies any difficulty swallowing or breathing.  She states she has been taking Zyrtec and Pepcid twice a day, the last time she took them was at 10:30 PM.  She states she cannot take steroids anymore because it makes her feel crazy and makes it hard for her to sleep.  Patient has an EpiPen which she did not use, and with her symptoms I do not feel like it was indicated tonight.  PCP Denita Lung, MD   Past Medical History:  Diagnosis Date  . Allergy    RHINITIS  . Osteoporosis    OSTEOPENIA  . Thyroid disease    HYPOTHYROID  . Urticaria     Patient Active Problem List   Diagnosis Date Noted  . Chronic idiopathic urticaria 11/23/2019  . Acute urticaria 10/19/2019  . Elevated antinuclear antibody (ANA) level 10/19/2019  . 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 S/P cataract surgery 09/18/2018  . Vitiligo 04/07/2015  . Hypothyroid 05/29/2011  . Allergic rhinitis due to pollen 05/29/2011  . Osteopenia 05/29/2011    Past Surgical History:  Procedure Laterality Date  . APPENDECTOMY    . arm surgery Right   . CHOLECYSTECTOMY    . SINOSCOPY    . SKIN BIOPSY Left 02/28/2018   shave lower left back neurofibroma     OB History   No obstetric history on file.     Family History  Problem Relation Age of Onset  . Stroke Mother   . Arthritis Mother   . Allergic  rhinitis Mother   . Allergic rhinitis Brother   . Allergic rhinitis Maternal Grandmother   . Asthma Neg Hx   . Eczema Neg Hx   . Urticaria Neg Hx     Social History   Tobacco Use  . Smoking status: Never Smoker  . Smokeless tobacco: Never Used  Substance Use Topics  . Alcohol use: No  . Drug use: No    Home Medications Prior to Admission medications   Medication Sig Start Date End Date Taking? Authorizing Provider  aspirin 81 MG tablet Take 81 mg by mouth daily.      [provider]  Calcium Citrate (CITRACAL PO) Take 1,000 mg by mouth.    [provider]  cetirizine (ZYRTEC) 5 MG tablet Take 1 tablet (5 mg total) by mouth daily. 11/09/19   Dara Hoyer, FNP  EPINEPHrine 0.3 mg/0.3 mL IJ SOAJ injection Inject 0.3 mLs (0.3 mg total) into the muscle as needed for anaphylaxis. 08/17/19   Rita Ohara, MD  famotidine (PEPCID) 20 MG tablet Take 1 tablet (20 mg total) by mouth 2 (two) times daily as needed (hives or itch). 11/09/19   Dara Hoyer, FNP  levothyroxine (SYNTHROID) 100 MCG tablet TAKE  1 TABLET(100 MCG) BY MOUTH DAILY 10/07/19   Denita Lung, MD  Multiple Vitamins-Minerals (MULTIVITAMIN WITH MINERALS) tablet Take 1 tablet by mouth daily.    [provider]  predniSONE (DELTASONE) 20 MG tablet Take 2 po QD x 4d then 1 po QD x 4d 12/14/19   Rolland Porter, MD    Allergies    Levofloxacin and Shellfish allergy  Review of Systems   Review of Systems  All other systems reviewed and are negative.   Physical Exam Updated Vital Signs BP 105/75   Pulse 82   Temp 97.8 F (36.6 C) (Oral)   Resp 20   Ht 5\' 2"  (1.575 m)   Wt 62 kg   SpO2 100%   BMI 25.00 kg/m   Physical Exam Vitals and nursing note reviewed.  Constitutional:      Appearance: Normal appearance. She is normal weight.  HENT:     Head: Normocephalic and atraumatic.     Right Ear: External ear normal.     Left Ear: External ear normal.     Nose: Nose normal.     Mouth/Throat:      Mouth: Mucous membranes are moist.     Pharynx: No oropharyngeal exudate or posterior oropharyngeal erythema.     Comments: There is no obvious swelling of her tongue, lips, or oropharynx.  Her voice is normal, she is handling her secretions Eyes:     Extraocular Movements: Extraocular movements intact.     Conjunctiva/sclera: Conjunctivae normal.     Pupils: Pupils are equal, round, and reactive to light.  Cardiovascular:     Rate and Rhythm: Normal rate and regular rhythm.  Pulmonary:     Effort: Pulmonary effort is normal. No respiratory distress.     Breath sounds: Normal breath sounds.  Musculoskeletal:        General: Normal range of motion.     Cervical back: Normal range of motion and neck supple.  Skin:    General: Skin is warm and dry.     Findings: Rash present.     Comments: Patient has a couple of hives in her lower mid back, there are a couple smaller ones in the lateral aspect of the back on either side near the axilla.  Neurological:     General: No focal deficit present.     Mental Status: She is alert and oriented to person, place, and time.     Cranial Nerves: No cranial nerve deficit.  Psychiatric:        Mood and Affect: Mood is anxious.        Speech: Speech is rapid and pressured.        Behavior: Behavior is agitated.     ED Results / Procedures / Treatments   Labs (all labs ordered are listed, but only abnormal results are displayed) Labs Reviewed - No data to display  EKG None  Radiology No results found.  Procedures Procedures (including critical care time)  Medications Ordered in ED Medications  diphenhydrAMINE (BENADRYL) injection 25 mg (25 mg Intravenous Given 12/14/19 0417)  dexamethasone (DECADRON) injection 10 mg (10 mg Intravenous Given 12/14/19 0503)  diphenhydrAMINE (BENADRYL) injection 25 mg (25 mg Intravenous Given 12/14/19 0503)    ED Course  I have reviewed the triage vital signs and the nursing notes.  Pertinent labs &  imaging results that were available during my care of the patient were reviewed by me and considered in my medical decision making (see chart for details).  MDM Rules/Calculators/A&P                     Patient was given Benadryl IV.  Recheck at 4:50 AM patient states she still itching, she still has the hives in the same areas.  Patient now states that the only thing that works is the steroids.  I was confused because she had made it very clear to me that she did not like how the steroids made her feel and she also said that to me in front of the nurse during her triage.  However she states now she is willing to take them again.  Patient was given another 25 of Benadryl and Decadron IV.  Recheck at 555 she states her itching is better.  When I look her hives in her back are gone but she still has the ones that are of on the lateral sides of her back near the axilla.  But they are fainter.  We discussed medications going home.  She finally decided that she would take a low-dose prednisone and she can use Benadryl as needed for escape itching or hives.  She is to continue her Zyrtec and Pepcid.  She had her friend were questioning because they are supposed to leave today to go to [place] on vacation.  She states she feels like heat makes her rash feel better which is unusual for hives.   Final Clinical Impression(s) / ED Diagnoses Final diagnoses:  Urticaria    Rx / DC Orders ED Discharge Orders         Ordered    predniSONE (DELTASONE) 20 MG tablet     12/14/19 0606         Plan discharge  Rolland Porter, MD, Barbette Or, MD 12/14/19 [phone]

## 2019-12-14 NOTE — Discharge Instructions (Addendum)
Continue your Zyrtec and Pepcid twice a day.  Take the prednisone as prescribed.  You can take Benadryl 25 or 50 mg every 4 hours if needed for itching or worsening hives.  Please follow-up with Dr. Ernst Bowler about what you should do for persistent symptoms.

## 2019-12-14 NOTE — Progress Notes (Signed)
Called by pt.  States she had to go to the ER early morning for hives.  She received IV decadron, benadryl x2.   She is calling as she is supposed to be going out of town to [place] and wants to know if it is ok to do so.   Hives have resolved at this time.  The ER did give prescription of prednisone; she has not picked this prescription yet.  She was also advise to take benadryl every 4 hours as needed  She is taking zyrtec and pepcid twice a day as maintenance.   She also has a epipen.     She started Xolair this month.    Advised since hives have improved with medications from ED she should be ok to go on her trip to [place].  Advised to keep doing her maintenance medications.  Also to pick up prednisone prescription to take with her and benadryl to use only as needed.    Trip is to this Friday.

## 2019-12-15 ENCOUNTER — Telehealth: Payer: Self-pay | Admitting: *Deleted

## 2019-12-15 NOTE — Telephone Encounter (Signed)
L/M for patient to return call to explain patient assistance for Xolair

## 2019-12-16 NOTE — Progress Notes (Signed)
Can we check up on her early next week? I hate to interrupt her during her vacation. I think it is fine for her to go on the trip as well.  Salvatore Marvel, MD All[facility]

## 2019-12-17 NOTE — Telephone Encounter (Signed)
Called patient and explained patient assistance and free drug thru program and app mailed to return to me

## 2019-12-21 NOTE — Progress Notes (Signed)
Office Visit Note  Patient: [name]             Date of Birth: 04/21/1946           MRN: [id number]             PCP: Denita Lung, MD Referring: Valentina Shaggy, * Visit Date: 12/28/2019 Occupation: _0 @  Subjective:  Recurrent hives.   History of Present Illness: [name] is a 74 y.o. female seen in consultation per request of her allergist.  According the patient she started doing a renovation on her house around Thanksgiving which she finished in April.  In February 2021 she started having hives.  She states she was given a steroid shot by Dr. Redmond School and then she was referred to an allergist in [place].  She has been on prednisone off and on since then.  Her last prednisone pack was finished about 2 weeks ago.  She has been on Zyrtec and Pepcid from the very beginning and then Xolair injections were added.  She has not noticed any rash in the last 2 weeks.  She states she has some underlying osteoarthritis in her hands which causes discomfort but there is no other joints involved.  She had some recent labs which came positive for ANA for that reason she was referred to me.  Activities of Daily Living:  Patient reports morning stiffness for 2 minutes.   Patient Denies nocturnal pain.  Difficulty dressing/grooming: Denies Difficulty climbing stairs: Denies Difficulty getting out of chair: Denies Difficulty using hands for taps, buttons, cutlery, and/or writing: Denies  Review of Systems  Constitutional: Negative for fatigue, night sweats, weight gain and weight loss.  HENT: Positive for mouth dryness. Negative for mouth sores, trouble swallowing, trouble swallowing and nose dryness.   Eyes: Negative for pain, redness, visual disturbance and dryness.  Respiratory: Negative for cough, shortness of breath and difficulty breathing.   Cardiovascular: Negative for chest pain, palpitations, hypertension, irregular heartbeat and swelling in legs/feet.    Gastrointestinal: Negative for blood in stool, constipation and diarrhea.  Endocrine: Negative for increased urination.  Genitourinary: Negative for vaginal dryness.  Musculoskeletal: Positive for arthralgias and joint pain. Negative for joint swelling, myalgias, muscle weakness, morning stiffness, muscle tenderness and myalgias.  Skin: Positive for rash. Negative for color change, hair loss, skin tightness, ulcers and sensitivity to sunlight.  Allergic/Immunologic: Negative for susceptible to infections.  Neurological: Negative for dizziness, memory loss, night sweats and weakness.  Hematological: Negative for swollen glands.  Psychiatric/Behavioral: Negative for depressed mood and sleep disturbance. The patient is nervous/anxious.     PMFS History:  Patient Active Problem List   Diagnosis Date Noted  . Chronic idiopathic urticaria 11/23/2019  . Acute urticaria 10/19/2019  . Elevated antinuclear antibody (ANA) level 10/19/2019  . 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 S/P cataract surgery 09/18/2018  . Vitiligo 04/07/2015  . Hypothyroid 05/29/2011  . Allergic rhinitis due to pollen 05/29/2011  . Osteopenia 05/29/2011    Past Medical History:  Diagnosis Date  . Allergy    RHINITIS  . Osteoporosis    OSTEOPENIA  . Thyroid disease    HYPOTHYROID  . Urticaria     Family History  Problem Relation Age of Onset  . Stroke Mother   . Arthritis Mother   . Allergic rhinitis Mother   . Allergic rhinitis Brother   . Allergic rhinitis Maternal Grandmother   . Parkinson's disease Brother   . Asthma Neg  Hx   . Eczema Neg Hx   . Urticaria Neg Hx    Past Surgical History:  Procedure Laterality Date  . APPENDECTOMY    . arm surgery Right   . CATARACT EXTRACTION, BILATERAL Bilateral   . CHOLECYSTECTOMY    . SINOSCOPY    . SKIN BIOPSY Left 02/28/2018   shave lower left back neurofibroma   Social History   Social History Narrative  . Not on file   Immunization  History  Administered Date(s) Administered  . DTaP 02/13/2002  . Fluad Quad(high Dose 65+) 03/12/2019  . Influenza Split 05/29/2011, 05/12/2012, 04/28/2013  . Influenza, High Dose Seasonal PF 05/12/2014, 04/07/2015, 05/17/2016, 04/11/2017, 04/17/2018  . Pneumococcal Conjugate-13 12/15/2013  . Pneumococcal Polysaccharide-23 02/13/2002  . Zoster 06/30/2007  . Zoster Recombinat (Shingrix) 05/01/2018, 09/04/2018     Objective: Vital Signs: BP 133/73 (BP Location: Right Arm, Patient Position: Sitting, Cuff Size: Small)   Pulse 69   Resp 12   Ht 5' 1.5" (1.562 m)   Wt 141 lb 6.4 oz (64.1 kg)   BMI 26.28 kg/m    Physical Exam Vitals and nursing note reviewed.  Constitutional:      Appearance: She is well-developed.  HENT:     Head: Normocephalic and atraumatic.  Eyes:     Conjunctiva/sclera: Conjunctivae normal.  Cardiovascular:     Rate and Rhythm: Normal rate and regular rhythm.     Heart sounds: Normal heart sounds.  Pulmonary:     Effort: Pulmonary effort is normal.     Breath sounds: Normal breath sounds.  Abdominal:     General: Bowel sounds are normal.     Palpations: Abdomen is soft.  Musculoskeletal:     Cervical back: Normal range of motion.  Lymphadenopathy:     Cervical: No cervical adenopathy.  Skin:    General: Skin is warm and dry.     Capillary Refill: Capillary refill takes less than 2 seconds.     Comments: Vitiligo was noted.  Neurological:     Mental Status: She is alert and oriented to person, place, and time.  Psychiatric:        Behavior: Behavior normal.      Musculoskeletal Exam: C-spine, thoracic and lumbar spine were in good range of motion.  Shoulder joints, elbow joints, wrist joints with good range of motion.  She has bilateral CMC PIP and DIP thickening consistent with osteoarthritis.  No synovitis was noted.  Hip joints, knee joints, ankles, MTPs and PIPs with good range of motion with no synovitis.  CDAI Exam: CDAI Score: -- Patient  Global: --; Provider Global: -- Swollen: --; Tender: -- Joint Exam 12/28/2019   No joint exam has been documented for this visit   There is currently no information documented on the homunculus. Go to the Rheumatology activity and complete the homunculus joint exam.  Investigation: No additional findings.  Imaging: No results found.  Recent Labs: Lab Results  Component Value Date   WBC 6.1 09/21/2019   HGB 14.4 09/21/2019   PLT 233 09/21/2019   NA 141 09/21/2019   K 5.0 09/21/2019   CL 103 09/21/2019   CO2 21 09/21/2019   GLUCOSE 93 09/21/2019   BUN 20 09/21/2019   CREATININE 0.72 09/21/2019   BILITOT 0.4 09/21/2019   ALKPHOS 82 09/21/2019   AST 21 09/21/2019   ALT 15 09/21/2019   PROT 7.0 10/09/2019   ALBUMIN 4.3 09/21/2019   CALCIUM 9.9 09/21/2019   GFRAA 97 09/21/2019  QFTBGOLDPLUS Negative 09/07/2017    Speciality Comments: No specialty comments available.  Procedures:  No procedures performed Allergies: Levofloxacin and Shellfish allergy   Assessment / Plan:     Visit Diagnoses: Positive ANA (antinuclear antibody) - 10/09/19: ANA+, dsDNA-, RNP-, Sm-, Scl-70 1.3, Ro-, La-, chromatin ab-, AntiJO1-, RF-, ESR 10, CRP<1 -she has positive ANA and positive SCL 70.  She has no clinical features of scleroderma.  Positive ANA could be present in 15% of normal population.  It could also be seen in patients with underlying thyroid disease.  She has TPO and antithyroglobulin antibodies.  I will check ANA titer and complements today.  I have advised her to contact me in case she develops any new symptoms.  Plan: ANA, C3 and C4  Primary osteoarthritis of both hands-she is some discomfort in her hands.  Clinical examination is consistent with osteoarthritis.  We decided not to obtain any x-rays today.  Pain in both hands  Chronic idiopathic urticaria-patient has been suffering from urticaria since February.  She states she was under a lot of stress as she was renovating her  house.  The stress level is getting better.   Other specified hypothyroidism - thyroperoxidase ab+, thyroglobulin ab+  Vitiligo  Osteopenia, unspecified location  Orders: Orders Placed This Encounter  Procedures  . ANA  . C3 and C4   No orders of the defined types were placed in this encounter.   .  Follow-Up Instructions: Return for Hives, positive ANA.   Bo Merino, MD  Note - This record has been created using Editor, commissioning.  Chart creation errors have been sought, but may not always  have been located. Such creation errors do not reflect on  the standard of medical care.

## 2019-12-23 NOTE — Progress Notes (Signed)
Called patient and she stated that she is doing good. She said she doesn't know if the stress she was under caused the out break or not but she has since relieved herself of some of that stress. Patient didn't have any issues on her trip and is appreciative for the follow up call. She did inform me that her rheumatologist appointment has been moved up to this coming Monday 12/28/2019.

## 2019-12-24 NOTE — Progress Notes (Signed)
Thanks for following up on that. I appreciate it. It takes a village to take care of the patients we have!   Salvatore Marvel, MD [facility] [place]

## 2019-12-28 ENCOUNTER — Ambulatory Visit: Payer: Medicare Other | Admitting: Rheumatology

## 2019-12-28 ENCOUNTER — Encounter: Payer: Self-pay | Admitting: Rheumatology

## 2019-12-28 ENCOUNTER — Other Ambulatory Visit: Payer: Self-pay

## 2019-12-28 VITALS — BP 133/73 | HR 69 | Resp 12 | Ht 61.5 in | Wt 141.4 lb

## 2019-12-28 DIAGNOSIS — E038 Other specified hypothyroidism: Secondary | ICD-10-CM

## 2019-12-28 DIAGNOSIS — L501 Idiopathic urticaria: Secondary | ICD-10-CM

## 2019-12-28 DIAGNOSIS — R768 Other specified abnormal immunological findings in serum: Secondary | ICD-10-CM

## 2019-12-28 DIAGNOSIS — M79641 Pain in right hand: Secondary | ICD-10-CM | POA: Diagnosis not present

## 2019-12-28 DIAGNOSIS — M8580 Other specified disorders of bone density and structure, unspecified site: Secondary | ICD-10-CM

## 2019-12-28 DIAGNOSIS — M19041 Primary osteoarthritis, right hand: Secondary | ICD-10-CM

## 2019-12-28 DIAGNOSIS — M79642 Pain in left hand: Secondary | ICD-10-CM

## 2019-12-28 DIAGNOSIS — M19042 Primary osteoarthritis, left hand: Secondary | ICD-10-CM

## 2019-12-28 DIAGNOSIS — L80 Vitiligo: Secondary | ICD-10-CM

## 2019-12-30 DIAGNOSIS — H26492 Other secondary cataract, left eye: Secondary | ICD-10-CM | POA: Diagnosis not present

## 2019-12-30 LAB — C3 AND C4
C3 Complement: 113 mg/dL (ref 83–193)
C4 Complement: 21 mg/dL (ref 15–57)

## 2019-12-30 LAB — ANTI-NUCLEAR AB-TITER (ANA TITER): ANA Titer 1: 1:80 {titer} — ABNORMAL HIGH

## 2019-12-30 LAB — ANA: Anti Nuclear Antibody (ANA): POSITIVE — AB

## 2019-12-30 NOTE — Progress Notes (Signed)
ANA titer is lower and complements are normal.  I will discuss results at the follow-up visit in detail.

## 2020-01-04 DIAGNOSIS — L821 Other seborrheic keratosis: Secondary | ICD-10-CM | POA: Diagnosis not present

## 2020-01-04 DIAGNOSIS — L720 Epidermal cyst: Secondary | ICD-10-CM | POA: Diagnosis not present

## 2020-01-04 DIAGNOSIS — L80 Vitiligo: Secondary | ICD-10-CM | POA: Diagnosis not present

## 2020-01-06 ENCOUNTER — Telehealth: Payer: Self-pay | Admitting: *Deleted

## 2020-01-06 NOTE — Telephone Encounter (Signed)
Called patient to advise free Xolair thru patient assistance and delivery of medication to make appt. Patient advised that after her first injection hives worsened and had to go to ER and rx prednisone.  She is ok right with no hives but wants to wait it out and not take any more Xolair at this time.  I advised her this would be next step to help control her hives and prevent having to take prednisone and if she decides in future she wants to resume Xolair she can contact office for appt for injection

## 2020-01-07 ENCOUNTER — Telehealth: Payer: Self-pay | Admitting: Allergy & Immunology

## 2020-01-07 NOTE — Telephone Encounter (Signed)
Patient called back this am and has decided to continue Xolair due to waking up this am with slightly swollen lip.  I advised patient that her medication cannot be delivered until 7/7 so she can come in on 7/9 and appt made.  I also advised patient to take all the medications prescribed by Dr Ernst Bowler for her symptoms until advised not to by MD

## 2020-01-07 NOTE — Telephone Encounter (Signed)
I would definitely make sure she is using her antihistamines as recommended at the last visit (cetirizine plus famotidine twice daily).  She can increase to 2 cetirizine twice daily for a day or 2.   She definitely needs to show up for her Xolair.  Sometimes it takes 3 injections before you see any improvement.  Salvatore Marvel, MD [facility] [place]

## 2020-01-07 NOTE — Telephone Encounter (Signed)
She definitely needs to continue her Xolair. She needs to give this time to work.  Thanks!   Salvatore Marvel, MD [facility] [place]

## 2020-01-07 NOTE — Telephone Encounter (Signed)
Called patient to see if she experiencing any shortness of breath or throat swelling and she states she isn't. Patient stated it started after she got out of the shower this morning. What do you advise patient to do?

## 2020-01-07 NOTE — Telephone Encounter (Signed)
Lip swelling the last couple of hours, no different foods than normal, no fever, no itching, no hives anywhere else.  Patient asks is there is a need for concern.

## 2020-01-07 NOTE — Telephone Encounter (Signed)
Called and left a message for patient to call the office in regards to this matter. 

## 2020-01-08 ENCOUNTER — Ambulatory Visit: Payer: Self-pay

## 2020-01-10 ENCOUNTER — Other Ambulatory Visit: Payer: Self-pay | Admitting: Allergy

## 2020-01-10 MED ORDER — PREDNISONE 10 MG PO TABS: ORAL_TABLET | ORAL | 0 refills | Status: AC

## 2020-01-10 NOTE — Progress Notes (Signed)
Called by pt as she states she is "broke out again in hives".   She states the hives are "driving her crazy" and she is scratching so much.  She started taking benadryl every 4 hours yesterday in addition to her zyrtec twice a day and pepcid twice a day.  She told me Thursday she woke up with lip swelling.  The hives are all over and worse on shoulders and thighs.  She states symptoms worsen when she is under the covers.   She states "she can't take this" and nothing is helping and only thing she states has helped is previous courses of prednisone.  She she states she had 1 dose of Xolair already but next dose isnt until 7/9.  Discussed Xolair can take up to 3-4 dose to see changes in hives.  Advised to continue her antihistamine regimen.  Will send in a predisone day pack at this time.    Please call pt 6/28 to check-in (she will call office as well if doesn't here from [place] before she calls).

## 2020-01-11 NOTE — Progress Notes (Signed)
Called and spoke with patient. Patient states that she started her prednisone yesterday. She said she was able to rest really well last night and can tell the prednisone is really helping. Patient is wondering if she can stay on prednisone until she's received her 4th dose of Xolair. Patient says the only thing that helps is the steroids. Patient says her hives have been horrible and it drives her crazy when they aren't controlled. She also mentioned that Dr.Padgett was going to add on Singulair instead of prednisone. Patient is wondering how the Singulair could be beneficial for her hives.

## 2020-01-11 NOTE — Telephone Encounter (Signed)
Patient called back on 01/10/2020 and spoke with Dr. Nelva Bush. See telephone encounter for 01/10/2020.

## 2020-01-12 MED ORDER — MONTELUKAST SODIUM 10 MG PO TABS: 10.0000 mg | ORAL_TABLET | Freq: Every day | ORAL | 5 refills | Status: DC

## 2020-01-12 NOTE — Addendum Note (Signed)
Addended by: Valentina Shaggy on: 01/12/2020 09:12 AM   Modules accepted: Orders

## 2020-01-12 NOTE — Progress Notes (Signed)
Forwarding to Dr. Ernst Bowler.   I advised pt that Dr. Ernst Bowler would make the decision in regards to any additional maintenance medications if he thought appropriate.

## 2020-01-12 NOTE — Progress Notes (Signed)
Lets add on Singulair 10 mg at night.  This is not an antihistamine, but rather can work with her current antihistamines to help with hives.  As Dr. Nelva Bush has mentioned, it often takes 3 doses of Xolair for this to become fully effective.  Script pended. Please call to confirm pharmacy.   Salvatore Marvel, MD [facility] [place]

## 2020-01-12 NOTE — Progress Notes (Signed)
Called and verified pharmacy. Called and informed patient. Patient verbalized understanding.

## 2020-01-12 NOTE — Addendum Note (Signed)
Addended by: Herbie Drape on: 01/12/2020 10:42 AM   Modules accepted: Orders

## 2020-01-17 ENCOUNTER — Ambulatory Visit
Admission: EM | Admit: 2020-01-17 | Discharge: 2020-01-17 | Discharge status: Absent without leave | Payer: Medicare Other

## 2020-01-17 ENCOUNTER — Ambulatory Visit
Admission: EM | Admit: 2020-01-17 | Discharge: 2020-01-17 | Disposition: A | Discharge status: Home / Self Care | Payer: Medicare Other

## 2020-01-17 DIAGNOSIS — S61213A Laceration without foreign body of left middle finger without damage to nail, initial encounter: Secondary | ICD-10-CM

## 2020-01-17 NOTE — ED Provider Notes (Addendum)
[facility]   [id number] 01/17/20 Arrival Time: 1418  Chief Complaint  Patient presents with  . Laceration     SUBJECTIVE:  [name] is a 74 y.o. female who presents with a laceration that occurred earlier today.  Laceration occurred from broken jar.  Bleeding controlled.  Currently on aspirin for blood thinner..  Denies similar symptoms in the past.  Denies fever, chills, nausea, vomiting, redness, swelling, purulent drainage, decrease strength or sensation.   Td UTD: Yes.  ROS: As per HPI.  All other pertinent ROS negative.     Past Medical History:  Diagnosis Date  . Allergy    RHINITIS  . Osteoporosis    OSTEOPENIA  . Thyroid disease    HYPOTHYROID  . Urticaria    Past Surgical History:  Procedure Laterality Date  . APPENDECTOMY    . arm surgery Right   . CATARACT EXTRACTION, BILATERAL Bilateral   . CHOLECYSTECTOMY    . SINOSCOPY    . SKIN BIOPSY Left 02/28/2018   shave lower left back neurofibroma   Allergies  Allergen Reactions  . Levofloxacin   . Shellfish Allergy    Current Facility-Administered Medications on File Prior to Encounter  Medication Dose Route Frequency Provider Last Rate Last Admin  . omalizumab Arvid Right) injection 300 mg  300 mg Subcutaneous Q28 days Valentina Shaggy, MD   300 mg at 12/11/19 1112   Current Outpatient Medications on File Prior to Encounter  Medication Sig Dispense Refill  . aspirin 81 MG tablet Take 81 mg by mouth daily.      . Ca Phosphate-Cholecalciferol (CALTRATE GUMMY BITES) 250-400 MG-UNIT CHEW Chew 1,000 mg by mouth daily.    . Calcium Citrate (CITRACAL PO) Take 1,000 mg by mouth.    . cetirizine (ZYRTEC) 5 MG tablet Take 1 tablet (5 mg total) by mouth daily. 31 tablet 2  . EPINEPHrine 0.3 mg/0.3 mL IJ SOAJ injection Inject 0.3 mLs (0.3 mg total) into the muscle as needed for anaphylaxis. 1 each 1  . famotidine (PEPCID) 20 MG tablet Take 1 tablet (20 mg total) by mouth 2 (two) times daily as  needed (hives or itch). 31 tablet 2  . levothyroxine (SYNTHROID) 100 MCG tablet TAKE 1 TABLET(100 MCG) BY MOUTH DAILY 90 tablet 3  . montelukast (SINGULAIR) 10 MG tablet Take 1 tablet (10 mg total) by mouth at bedtime. 30 tablet 5  . Multiple Vitamins-Minerals (MULTIVITAMIN WITH MINERALS) tablet Take 1 tablet by mouth daily.    . predniSONE (DELTASONE) 20 MG tablet Take 2 po QD x 4d then 1 po QD x 4d (Patient not taking: Reported on 12/28/2019) 12 tablet 0   Social History   Socioeconomic History  . Marital status: Widowed    Spouse name: Not on file  . Number of children: Not on file  . Years of education: Not on file  . Highest education level: Not on file  Occupational History  . Not on file  Tobacco Use  . Smoking status: Never Smoker  . Smokeless tobacco: Never Used  Vaping Use  . Vaping Use: Never used  Substance and Sexual Activity  . Alcohol use: No  . Drug use: No  . Sexual activity: Yes  Other Topics Concern  . Not on file  Social History Narrative  . Not on file   Social Determinants of Health   Financial Resource Strain:   . Difficulty of Paying Living Expenses:   Food Insecurity:   . Worried About Running  Out of Food in the Last Year:   . [place] in the Last Year:   Transportation Needs:   . Lack of Transportation (Medical):   Marland Kitchen Lack of Transportation (Non-Medical):   Physical Activity:   . Days of Exercise per Week:   . Minutes of Exercise per Session:   Stress:   . Feeling of Stress :   Social Connections:   . Frequency of Communication with Friends and Family:   . Frequency of Social Gatherings with Friends and Family:   . Attends Religious Services:   . Active Member of Clubs or Organizations:   . Attends Archivist Meetings:   Marland Kitchen Marital Status:   Intimate Partner Violence:   . Fear of Current or Ex-Partner:   . Emotionally Abused:   Marland Kitchen Physically Abused:   . Sexually Abused:    Family History  Problem Relation Age of Onset    . Stroke Mother   . Arthritis Mother   . Allergic rhinitis Mother   . Allergic rhinitis Brother   . Allergic rhinitis Maternal Grandmother   . Parkinson's disease Brother   . Asthma Neg Hx   . Eczema Neg Hx   . Urticaria Neg Hx      OBJECTIVE:  There were no vitals filed for this visit.   General appearance: alert; no distress Heart: RRR, no murmur, rub or gallop Chest: CTA, heart sounds normal Skin: laceration of left middle finger; size: approx 2 cm Psychological: alert and cooperative; normal mood and affect   Results for orders placed or performed in visit on 12/28/19  ANA  Result Value Ref Range   Anti Nuclear Antibody (ANA) POSITIVE (A) NEGATIVE  C3 and C4  Result Value Ref Range   C3 Complement 113 83 - 193 mg/dL   C4 Complement 21 15 - 57 mg/dL  Anti-nuclear ab-titer (ANA titer)  Result Value Ref Range   ANA Titer 1 1:80 (H) titer   ANA Pattern 1 Nuclear, Speckled (A)     Labs Reviewed - No data to display  No results found.  Procedure: Verbal consent obtained. Patient provided with risks and alternatives to the procedure. Wound copiously irrigated with NS then cleansed with betadine.  Wound carefully explored. No foreign body, tendon injury, or nonviable tissue were noted. Using sterile technique Dermabond skin glue was applied to reapproximate the wound. Patient tolerated procedure well. No complications. Minimal bleeding. Patient advised to look for and return for any signs of infection such as redness, swelling, discharge, or worsening pain.   ASSESSMENT & PLAN:  1. Laceration of left middle finger without foreign body without damage to nail, initial encounter     No orders of the defined types were placed in this encounter.  Discharge instructions Bandage applied Avoid submerging wound in water  Take OTC ibuprofen or tylenol as needed for pain releif Return sooner or go to the ED if you have any new or worsening symptoms such as increased pain,  redness, swelling, drainage, discharge, decreased range of motion of extremity, etc..     Reviewed expectations re: course of current medical issues. Questions answered. Outlined signs and symptoms indicating need for more acute intervention. Patient verbalized understanding. After Visit Summary given.      Note: This document was prepared using Dragon voice recognition software and may include unintentional dictation errors.    Emerson Monte, FNP 01/17/20 1446    Emerson Monte, FNP 01/17/20 1450

## 2020-01-17 NOTE — Discharge Instructions (Addendum)
Bandage applied Avoid submerging wound in water..   Take OTC ibuprofen or tylenol as needed for pain releif Return sooner or go to the ED if you have any new or worsening symptoms such as increased pain, redness, swelling, drainage, discharge, decreased range of motion of extremity, etc..

## 2020-01-17 NOTE — ED Triage Notes (Signed)
Pt presents with laceration to left finger from broken jar, bleeding controlled , tetanus up to date

## 2020-01-19 ENCOUNTER — Ambulatory Visit: Payer: Medicare Other | Admitting: Family

## 2020-01-19 ENCOUNTER — Encounter: Payer: Self-pay | Admitting: Family

## 2020-01-19 ENCOUNTER — Other Ambulatory Visit: Payer: Self-pay

## 2020-01-19 VITALS — BP 128/68 | HR 70 | Resp 18 | Ht 61.5 in

## 2020-01-19 DIAGNOSIS — R768 Other specified abnormal immunological findings in serum: Secondary | ICD-10-CM | POA: Diagnosis not present

## 2020-01-19 DIAGNOSIS — L501 Idiopathic urticaria: Secondary | ICD-10-CM

## 2020-01-19 NOTE — Progress Notes (Signed)
[facility] [address] Dept: [phone]  FOLLOW UP NOTE  Patient ID: [name], female    DOB: 02/26/1946  Age: 74 y.o. MRN: [id number] Date of Office Visit: 01/19/2020  Assessment  Chief Complaint: Urticaria  HPI [name] is a 74 year old female who presents for an acute visit today.  She was last seen on Dec 11, 2019 by Dr. Ernst Bowler for chronic urticaria with recurrent anaphylaxis episodes and elevated ANA.  She reports on 01/17/2020 she called our on-call physician concerning her hives and reports that she was instructed to start the Singulair 10 mg once a day and to use Benadryl as needed along with the cetirizine 10 mg 1 tablet twice a day and famotidine 1 tablet twice a day.  She had concerns after reading the potential side effects with montelukast, but was informed by the on-call physician that this was safer to take than the prednisone.  She has since then had montelukast this past Sunday and Monday.  Her hives got worse yesterday and she was not able to sleep last night.  The hives started on her legs and are now on the sides of her abdomen, bilateral arms, and neck.  She reports that she has been taking steroids off and on for 4 to 5 months.  After reviewing her chart it looks like since her last office visit she was given a prednisone day pack on January 10, 2020 and also received IV Decadron and a prescription for prednisone from the emergency room on May 31.  She received her first Xolair injection on Dec 11, 2019 and cannot remember if she noticed any difference since that injection.  She reports that with this episode of hives she has not had any swelling of her lips or tongue.  She did see a rheumatologist on December 28, 2019 for an elevated ANA and had some lab work completed.  She does not know the results of this lab work yet.  Current medications are as listed in the chart.     Drug Allergies:  Allergies  Allergen Reactions    Levofloxacin    Shellfish Allergy     Review of Systems: Review of Systems  Constitutional: Negative for chills and fever.  HENT: Negative for sinus pain and sore throat.   Eyes: Negative for blurred vision.  Respiratory: Negative for cough, shortness of breath and wheezing.   Cardiovascular: Negative for chest pain and palpitations.  Gastrointestinal: Positive for nausea. Negative for abdominal pain, heartburn and vomiting.       Reports nausea at times  Genitourinary: Negative for dysuria.  Skin: Positive for itching and rash.  Neurological: Negative for headaches.     Physical Exam: BP 128/68    Pulse 70    Resp 18    Ht 5' 1.5" (1.562 m)    SpO2 96%    BMI 26.28 kg/m    Physical Exam Constitutional:      Appearance: Normal appearance.  HENT:     Head: Normocephalic and atraumatic.     Comments: Pharynx normal. Eyes normal. Ears normal. Nose normal    Right Ear: Tympanic membrane, ear canal and external ear normal.     Left Ear: Tympanic membrane, ear canal and external ear normal.     Nose: Nose normal.     Mouth/Throat:     Mouth: Mucous membranes are moist.     Pharynx: Oropharynx is clear.  Eyes:     Conjunctiva/sclera: Conjunctivae normal.  Cardiovascular:     Rate and Rhythm: Regular rhythm.     Heart sounds: Normal heart sounds.  Pulmonary:     Effort: Pulmonary effort is normal.     Breath sounds: Normal breath sounds.     Comments: Lungs clear to auscultation. Musculoskeletal:     Cervical back: Neck supple.  Skin:    General: Skin is warm.     Comments: Urticarial lesions noted on bilateral arms, legs, sides of abdomen and neck  Neurological:     Mental Status: She is alert and oriented to person, place, and time.  Psychiatric:        Mood and Affect: Mood normal.        Behavior: Behavior normal.        Thought Content: Thought content normal.        Judgment: Judgment normal.     Diagnostics:  None  Assessment and Plan: 1. Chronic  idiopathic urticaria   2. Elevated antinuclear antibody (ANA) level     No orders of the defined types were placed in this encounter.   Patient Instructions  Chronic urticaria Continue famotidine one tablet twice a day to help with itching. Increase cetirizine- 2 tablets twice a day to help with itching. May decrease to 1 tablet twice a day if too sedating. Continue Xolair 300 mg injections every 4 weeks to help with itching. Continue Singulair 10 mg once a day to help with itching. Patient cautioned that rarely some children/adults can experience behavioral changes after beginning montelukast. These side effects are rare, however, if you notice any change, notify the clinic and discontinue montelukast. May use Benadryl 25 mg as needed at night to help with itching Epipen is up to date. Given second dose of Xolair 300 mg once every 4 weeks today in office.  Elevated ANA Continue to follow up with Rheumatology  Please let us know if this treatment plan is not working well for you. Schedule follow up appointment in 1-2 months.   Return in about 2 months (around 03/21/2020), or if symptoms worsen or fail to improve.    Thank you for the opportunity to care for this patient.  Please do not hesitate to contact me with questions.  [name], FNP [facility] [place]

## 2020-01-19 NOTE — Patient Instructions (Addendum)
Chronic urticaria Continue famotidine one tablet twice a day to help with itching. Increase cetirizine- 2 tablets twice a day to help with itching. May decrease to 1 tablet twice a day if too sedating. Continue Xolair 300 mg injections every 4 weeks to help with itching. Continue Singulair 10 mg once a day to help with itching. Patient cautioned that rarely some children/adults can experience behavioral changes after beginning montelukast. These side effects are rare, however, if you notice any change, notify the clinic and discontinue montelukast. May use Benadryl 25 mg as needed at night to help with itching Epipen is up to date. Given second dose of Xolair 300 mg once every 4 weeks today in office.  Elevated ANA Continue to follow up with Rheumatology  Please let us know if this treatment plan is not working well for you. Schedule follow up appointment in 1-2 months.

## 2020-01-21 ENCOUNTER — Ambulatory Visit: Payer: Medicare Other | Admitting: Rheumatology

## 2020-01-21 ENCOUNTER — Telehealth: Payer: Self-pay | Admitting: Allergy & Immunology

## 2020-01-21 MED ORDER — HYDROXYZINE HCL 25 MG PO TABS: 12.5000 mg | ORAL_TABLET | Freq: Every evening | ORAL | 0 refills | Status: DC | PRN

## 2020-01-21 NOTE — Telephone Encounter (Signed)
Patient was seen on Tuesday and is still not any better. Patient is taking medications as prescribed. She still has break outs, no new break outs, and is itching. Please call home phone number.  Please advise.

## 2020-01-21 NOTE — Telephone Encounter (Signed)
I spoke with Chrissie, FNP and verified that it was okay to do the 25 mg tablets and have patient take 1/2 tablet at night as needed. Per verbal okay Patient informed to take 1/2 tablet every evening as needed for itching. Patient verbalized understanding.

## 2020-01-21 NOTE — Telephone Encounter (Signed)
Called and spoke with the patient and she reports that she continues to have new hives and is itching while taking Zyrtec 10 mg 2 tablets twice a day, pepcid 1 tablet twice a day, Singulair 10 mg once a day and Benadryl qhs. She reports that she may be some better, but she is not sleeping at night. Instructed her that it does take time for the medications to work that were added at her last office visit this Tuesday. Instructed her to stop Benadryl and Start Hydroxyzine 12.5 mg one tablet at night to help with itching. Cautioned her about how Hydroxyzine can be sedating and to not take and drive. She verbalizes understanding. Please send  prescription for Hydroxyzine 12.5 mg one tablet at night  #30 no refills to [facility] on [address] in [place]. Thank you!

## 2020-01-21 NOTE — Telephone Encounter (Signed)
Please advise 

## 2020-01-22 ENCOUNTER — Ambulatory Visit: Payer: Medicare Other

## 2020-01-22 ENCOUNTER — Emergency Department (HOSPITAL_COMMUNITY)
Admission: EM | Admit: 2020-01-22 | Discharge: 2020-01-22 | Disposition: A | Discharge status: Home / Self Care | Payer: Medicare Other | Attending: Emergency Medicine | Admitting: Emergency Medicine

## 2020-01-22 ENCOUNTER — Other Ambulatory Visit: Payer: Self-pay

## 2020-01-22 ENCOUNTER — Telehealth: Payer: Self-pay | Admitting: Rheumatology

## 2020-01-22 ENCOUNTER — Telehealth: Payer: Self-pay | Admitting: Family

## 2020-01-22 ENCOUNTER — Encounter (HOSPITAL_COMMUNITY): Payer: Self-pay | Admitting: Emergency Medicine

## 2020-01-22 DIAGNOSIS — L509 Urticaria, unspecified: Secondary | ICD-10-CM | POA: Diagnosis not present

## 2020-01-22 DIAGNOSIS — L299 Pruritus, unspecified: Secondary | ICD-10-CM | POA: Diagnosis not present

## 2020-01-22 DIAGNOSIS — E039 Hypothyroidism, unspecified: Secondary | ICD-10-CM | POA: Diagnosis not present

## 2020-01-22 DIAGNOSIS — R21 Rash and other nonspecific skin eruption: Secondary | ICD-10-CM | POA: Diagnosis present

## 2020-01-22 DIAGNOSIS — L501 Idiopathic urticaria: Secondary | ICD-10-CM

## 2020-01-22 MED ORDER — DEXAMETHASONE SODIUM PHOSPHATE 10 MG/ML IJ SOLN
10.0000 mg | Freq: Once | INTRAMUSCULAR | Status: AC
  Administered 2020-01-22: 10 mg via INTRAVENOUS
  Filled 2020-01-22: qty 1

## 2020-01-22 MED ORDER — DIPHENHYDRAMINE HCL 50 MG/ML IJ SOLN
25.0000 mg | Freq: Once | INTRAMUSCULAR | Status: AC
  Administered 2020-01-22: 25 mg via INTRAVENOUS
  Filled 2020-01-22: qty 1

## 2020-01-22 MED ORDER — PREDNISONE 50 MG PO TABS: ORAL_TABLET | ORAL | 0 refills | Status: DC

## 2020-01-22 MED ORDER — FAMOTIDINE IN NACL 20-0.9 MG/50ML-% IV SOLN
20.0000 mg | Freq: Once | INTRAVENOUS | Status: AC
  Administered 2020-01-22: 20 mg via INTRAVENOUS
  Filled 2020-01-22: qty 50

## 2020-01-22 NOTE — Telephone Encounter (Signed)
Patient reports that she was covered head to toe with hives upon waking at about 3 AM this morning.  She denies cardiopulmonary and gastrointestinal symptoms with these hives.  She did proceed to the emergency department where she received dexamethasone and Benadryl with almost complete resolution of hives.  Patient agrees to take a maintenance regimen to decrease her hives breakout.  She agrees to take this regimen regardless of status of hives.  Every morning-cetirizine 20 mg, Pepcid 20 mg, and montelukast 10 mg.  Every evening-cetirizine 20 mg and Pepcid 20 mg.  She will take Benadryl for breakthrough hives or itching.  Once a month she will continue Xolair injection.  She is not interested in taking hydroxyzine at this time.  She will begin the prednisone that was given to her at the hospital.  She will follow up with her rheumatology evaluation on Tuesday.  She will call with any further questions.

## 2020-01-22 NOTE — Telephone Encounter (Signed)
Patient called and said she had to go to the ED last night due to hives from head to toe. She said she did not take the Hydroxyzine that Crissie prescribed for her. She said she was afraid to. They gave her prednisone at the hospital, and she wants to know if this is ok to take. She said no one knows why she keeps getting hives. The doctor in the ED told her she may need to be referred out to an allergist at [facility].

## 2020-01-22 NOTE — Telephone Encounter (Signed)
Opened in error

## 2020-01-22 NOTE — Telephone Encounter (Signed)
Patient called again stating she wanted to know what her next steps should be regarding the hives. She states she took 2 zyrtec, 1 pepcid, and 2 benadryls last night and woke up at 3am with severe hives. She states the doctor at the ER told her that she should be referred to another allergists either at [facility] since we can't get her hives under control. Patient has the prednisone that was given to her by [name] but she has not taken it and wonders if she should go ahead and take it since she was given IV steroids in the hospital? Patient also states she was prescribed Hydroxyzine but did not want to take it because she read that it's typically given to patient's to put them to sleep before surgery. Patient would like to know what she take?

## 2020-01-22 NOTE — ED Provider Notes (Signed)
[facility] EMERGENCY DEPARTMENT Provider Note   CSN: [id number] Arrival date & time: 01/22/20  0430     History Chief Complaint  Patient presents with  . Allergic Reaction    [name] is a 74 y.o. female.  The history is provided by the patient.  Allergic Reaction Presenting symptoms: itching and rash   Presenting symptoms: no difficulty breathing, no difficulty swallowing and no wheezing   Severity:  Moderate Relieved by:  Nothing Worsened by:  Nothing Ineffective treatments:  Antihistamines Patient with history of chronic idiopathic urticaria reports a recurrence of urticaria.  She reports this is been ongoing for several months.  She reports she went to bed after taking a bath with Aveeno.  She reports Aveeno will help her symptoms at times.  She woke up in the night feeling that her urticaria was worse.  She reports intense itching.  She reports mild tongue tingling but no difficulty swallowing or tongue swelling.  No chest pain or shortness of breath.  She reports feeling anxious.  She did not use an EpiPen but did take a Benadryl.  She reports multiple rounds of steroids in the past.  She is not currently on steroids.  She is on Benadryl, Zyrtec, Singulair, Pepcid.  She has been afraid to start her hydroxyzine She reports they have never been able to diagnose will cause the urticaria She also received Xolair injection    Past Medical History:  Diagnosis Date  . Allergy    RHINITIS  . Osteoporosis    OSTEOPENIA  . Thyroid disease    HYPOTHYROID  . Urticaria     Patient Active Problem List   Diagnosis Date Noted  . Chronic idiopathic urticaria 11/23/2019  . Acute urticaria 10/19/2019  . Elevated antinuclear antibody (ANA) level 10/19/2019  . 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 S/P cataract surgery 09/18/2018  . Vitiligo 04/07/2015  . Hypothyroid 05/29/2011  . Allergic rhinitis due to pollen 05/29/2011  . Osteopenia 05/29/2011    Past  Surgical History:  Procedure Laterality Date  . APPENDECTOMY    . arm surgery Right   . CATARACT EXTRACTION, BILATERAL Bilateral   . CHOLECYSTECTOMY    . SINOSCOPY    . SKIN BIOPSY Left 02/28/2018   shave lower left back neurofibroma     OB History   No obstetric history on file.     Family History  Problem Relation Age of Onset  . Stroke Mother   . Arthritis Mother   . Allergic rhinitis Mother   . Allergic rhinitis Brother   . Allergic rhinitis Maternal Grandmother   . Parkinson's disease Brother   . Asthma Neg Hx   . Eczema Neg Hx   . Urticaria Neg Hx     Social History   Tobacco Use  . Smoking status: Never Smoker  . Smokeless tobacco: Never Used  Vaping Use  . Vaping Use: Never used  Substance Use Topics  . Alcohol use: No  . Drug use: No    Home Medications Prior to Admission medications   Medication Sig Start Date End Date Taking? Authorizing Provider  aspirin 81 MG tablet Take 81 mg by mouth daily.      [provider]  Ca Phosphate-Cholecalciferol (CALTRATE GUMMY BITES) 250-400 MG-UNIT CHEW Chew 1,000 mg by mouth daily.    [provider]  Calcium Citrate (CITRACAL PO) Take 1,000 mg by mouth.    [provider]  cetirizine (ZYRTEC) 5 MG tablet Take  1 tablet (5 mg total) by mouth daily. 11/09/19   Dara Hoyer, FNP  EPINEPHrine 0.3 mg/0.3 mL IJ SOAJ injection Inject 0.3 mLs (0.3 mg total) into the muscle as needed for anaphylaxis. 08/17/19   Rita Ohara, MD  famotidine (PEPCID) 20 MG tablet Take 1 tablet (20 mg total) by mouth 2 (two) times daily as needed (hives or itch). 11/09/19   Dara Hoyer, FNP  hydrOXYzine (ATARAX/VISTARIL) 25 MG tablet Take 0.5 tablets (12.5 mg total) by mouth at bedtime as needed for itching. 01/21/20   [name], FNP  levothyroxine (SYNTHROID) 100 MCG tablet TAKE 1 TABLET(100 MCG) BY MOUTH DAILY 10/07/19   Denita Lung, MD  montelukast (SINGULAIR) 10 MG tablet Take 1 tablet (10 mg total) by mouth at  bedtime. 01/12/20   Valentina Shaggy, MD  Multiple Vitamins-Minerals (MULTIVITAMIN WITH MINERALS) tablet Take 1 tablet by mouth daily.    [provider]  predniSONE (DELTASONE) 20 MG tablet Take 2 po QD x 4d then 1 po QD x 4d 12/14/19   Rolland Porter, MD    Allergies    Levofloxacin and Shellfish allergy  Review of Systems   Review of Systems  Constitutional: Negative for fever.  HENT: Negative for trouble swallowing.   Respiratory: Negative for wheezing.   Gastrointestinal: Negative for vomiting.  Skin: Positive for itching and rash.  All other systems reviewed and are negative.   Physical Exam Updated Vital Signs BP (!) 157/74   Pulse 89   Temp (!) 97.5 F (36.4 C) (Oral)   Resp 17   SpO2 97%   Physical Exam CONSTITUTIONAL: Elderly, anxious HEAD: Normocephalic/atraumatic EYES: EOMI/PERRL ENMT: Mucous membranes moist, no angioedema NECK: supple no meningeal signs SPINE/BACK:entire spine nontender CV: S1/S2 noted, no murmurs/rubs/gallops noted LUNGS: Lungs are clear to auscultation bilaterally, no apparent distress ABDOMEN: soft, nontender, no rebound or guarding, bowel sounds noted throughout abdomen GU:no cva tenderness NEURO: Pt is awake/alert/appropriate, moves all extremitiesx4.  No facial droop.  EXTREMITIES: pulses normal/equal, full ROM SKIN: warm, color normal urticaria noted on her chest, back, extremities PSYCH: Anxious  ED Results / Procedures / Treatments   Labs (all labs ordered are listed, but only abnormal results are displayed) Labs Reviewed - No data to display  EKG None  Radiology No results found.  Procedures Procedures   Medications Ordered in ED Medications  diphenhydrAMINE (BENADRYL) injection 25 mg (has no administration in time range)  diphenhydrAMINE (BENADRYL) injection 25 mg (25 mg Intravenous Given 01/22/20 0520)  dexamethasone (DECADRON) injection 10 mg (10 mg Intravenous Given 01/22/20 0518)  famotidine (PEPCID) IVPB 20  mg premix (0 mg Intravenous Stopped 01/22/20 0645)    ED Course  I have reviewed the triage vital signs and the nursing notes.      MDM Rules/Calculators/A&P                          6:10 AM Patient history of chronic idiopathic urticaria.  She had a recurrence tonight.  She reports when she is at home she gets concerned when it becomes severe because she lives alone.  She did take 1 Benadryl prior to arrival 7:11 AM Patient with some improvement but still has urticaria.  Plan will be to give 1 more dose of Benadryl and monitor.  Patient agrees to do brief course of steroids as outpatient.  Advise follow-up with allergist.  Signed out to dr Wilson Singer at shift change Final Clinical Impression(s) / ED  Diagnoses Final diagnoses:  Urticaria    Rx / DC Orders ED Discharge Orders         Ordered    predniSONE (DELTASONE) 50 MG tablet     Discontinue  Reprint     01/22/20 0657           Ripley Fraise, MD 01/22/20 [phone]

## 2020-01-22 NOTE — ED Triage Notes (Signed)
Pt C/O hives on her head, trunk, and face. Pt denies tongue, lip, or throat swelling. Pt states that her tongue feels like it is tingling.

## 2020-01-26 ENCOUNTER — Ambulatory Visit: Payer: Medicare Other | Admitting: Rheumatology

## 2020-01-26 ENCOUNTER — Other Ambulatory Visit: Payer: Self-pay

## 2020-01-26 ENCOUNTER — Encounter: Payer: Self-pay | Admitting: Rheumatology

## 2020-01-26 VITALS — BP 146/73 | HR 77 | Resp 15 | Ht 62.0 in | Wt 144.2 lb

## 2020-01-26 DIAGNOSIS — L80 Vitiligo: Secondary | ICD-10-CM | POA: Diagnosis not present

## 2020-01-26 DIAGNOSIS — L501 Idiopathic urticaria: Secondary | ICD-10-CM | POA: Diagnosis not present

## 2020-01-26 DIAGNOSIS — E038 Other specified hypothyroidism: Secondary | ICD-10-CM | POA: Diagnosis not present

## 2020-01-26 DIAGNOSIS — M19041 Primary osteoarthritis, right hand: Secondary | ICD-10-CM | POA: Diagnosis not present

## 2020-01-26 DIAGNOSIS — R768 Other specified abnormal immunological findings in serum: Secondary | ICD-10-CM

## 2020-01-26 DIAGNOSIS — M19042 Primary osteoarthritis, left hand: Secondary | ICD-10-CM

## 2020-01-26 DIAGNOSIS — M8580 Other specified disorders of bone density and structure, unspecified site: Secondary | ICD-10-CM

## 2020-01-26 MED ORDER — SULFASALAZINE 500 MG PO TABS: 1000.0000 mg | ORAL_TABLET | Freq: Two times a day (BID) | ORAL | 1 refills | Status: DC

## 2020-01-26 MED ORDER — SULFASALAZINE 500 MG PO TABS: 500.0000 mg | ORAL_TABLET | Freq: Two times a day (BID) | ORAL | 0 refills | Status: DC

## 2020-01-26 NOTE — Telephone Encounter (Signed)
Looks perfect.  Please call the patient to let her know of the new plan.  Can you make sure that she has a Xolair appointment?  Salvatore Marvel, MD [facility] [place]

## 2020-01-26 NOTE — Addendum Note (Signed)
Addended by: Lucrezia Starch I on: 01/26/2020 12:25 PM   Modules accepted: Orders

## 2020-01-26 NOTE — Telephone Encounter (Signed)
Patient called and would like to be referred to a real specialist at either [facility] or [facility] since no one can figure out why she is getting hives. Patient states she cannot keep just taking prednisone, because it is just making her nervous and gaining weight.  Patient states she went to the hospital on Friday due to hives and they agreed that she needs to go to another specialist like [facility] or [facility].  Please advise if a referral needs to be placed.

## 2020-01-26 NOTE — Addendum Note (Signed)
Addended by: Valentina Shaggy on: 01/26/2020 02:36 PM   Modules accepted: Orders

## 2020-01-26 NOTE — Telephone Encounter (Signed)
I am really hoping that the Xolair will start working eventually.  In addition to all of her other antihistamines, lets start sulfasalazine:   500 mg twice daily for 1 week, increasing to 1000 mg twice daily thereafter.  Please tell her to take with food, as this can cause some GI discomfort.  Salvatore Marvel, MD [facility] [place]

## 2020-01-26 NOTE — Progress Notes (Signed)
Office Visit Note  Patient: [name]             Date of Birth: 22-Nov-1945           MRN: [id number]             PCP: Denita Lung, MD Referring: Denita Lung, MD Visit Date: 01/26/2020 Occupation: @GUAROCC @  Subjective:  Hives.   History of Present Illness: [name] is a 74 y.o. female patient states she has been having recurrent hives for the last 6 months.  She was recently seen in the hospital and had IV medications and still is on prednisone and Singulair.  She states she is very much frustrated about the current situation.  She would like to be referred to a tertiary care center.  She denies any history of oral ulcers, nasal ulcers, malar rash, photosensitivity, Raynaud's phenomenon, joint swelling.  She continues to have some stiffness in her joints due to underlying osteoarthritis.  She states she had a recent DEXA scan.  She is concerned about the weight gain from prednisone.  Activities of Daily Living:  Patient reports morning stiffness for a few minutes.   Patient Denies nocturnal pain.  Difficulty dressing/grooming: Denies Difficulty climbing stairs: Denies Difficulty getting out of chair: Denies Difficulty using hands for taps, buttons, cutlery, and/or writing: Denies  Review of Systems  Constitutional: Positive for weight gain. Negative for fatigue, night sweats and weight loss.  HENT: Positive for mouth dryness. Negative for mouth sores, trouble swallowing, trouble swallowing and nose dryness.   Eyes: Negative for pain, redness, itching, visual disturbance and dryness.  Respiratory: Negative for cough, shortness of breath and difficulty breathing.   Cardiovascular: Negative for chest pain, palpitations, hypertension, irregular heartbeat and swelling in legs/feet.  Gastrointestinal: Negative for blood in stool, constipation and diarrhea.  Endocrine: Positive for excessive thirst. Negative for increased urination.  Genitourinary: Negative for  difficulty urinating and vaginal dryness.  Musculoskeletal: Positive for morning stiffness. Negative for arthralgias, joint pain, joint swelling, myalgias, muscle weakness, muscle tenderness and myalgias.  Skin: Positive for rash. Negative for color change, hair loss, skin tightness, ulcers and sensitivity to sunlight.  Allergic/Immunologic: Negative for susceptible to infections.  Neurological: Negative for dizziness, numbness, headaches, memory loss, night sweats and weakness.  Hematological: Positive for bruising/bleeding tendency. Negative for swollen glands.  Psychiatric/Behavioral: Negative for depressed mood, confusion and sleep disturbance. The patient is not nervous/anxious.     PMFS History:  Patient Active Problem List   Diagnosis Date Noted  . Chronic idiopathic urticaria 11/23/2019  . Acute urticaria 10/19/2019  . Elevated antinuclear antibody (ANA) level 10/19/2019  . 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 S/P cataract surgery 09/18/2018  . Vitiligo 04/07/2015  . Hypothyroid 05/29/2011  . Allergic rhinitis due to pollen 05/29/2011  . Osteopenia 05/29/2011    Past Medical History:  Diagnosis Date  . Allergy    RHINITIS  . Osteoporosis    OSTEOPENIA  . Thyroid disease    HYPOTHYROID  . Urticaria     Family History  Problem Relation Age of Onset  . Stroke Mother   . Arthritis Mother   . Allergic rhinitis Mother   . Allergic rhinitis Brother   . Allergic rhinitis Maternal Grandmother   . Parkinson's disease Brother   . Asthma Neg Hx   . Eczema Neg Hx   . Urticaria Neg Hx    Past Surgical History:  Procedure Laterality Date  . APPENDECTOMY    .  arm surgery Right   . CATARACT EXTRACTION, BILATERAL Bilateral   . CHOLECYSTECTOMY    . SINOSCOPY    . SKIN BIOPSY Left 02/28/2018   shave lower left back neurofibroma   Social History   Social History Narrative  . Not on file   Immunization History  Administered Date(s) Administered  . DTaP  02/13/2002  . Fluad Quad(high Dose 65+) 03/12/2019  . Influenza Split 05/29/2011, 05/12/2012, 04/28/2013  . Influenza, High Dose Seasonal PF 05/12/2014, 04/07/2015, 05/17/2016, 04/11/2017, 04/17/2018  . Pneumococcal Conjugate-13 12/15/2013  . Pneumococcal Polysaccharide-23 02/13/2002  . Zoster 06/30/2007  . Zoster Recombinat (Shingrix) 05/01/2018, 09/04/2018     Objective: Vital Signs: BP (!) 146/73 (BP Location: Left Arm, Patient Position: Sitting, Cuff Size: Normal)   Pulse 77   Resp 15   Ht 5' 2"  (1.575 m)   Wt 144 lb 3.2 oz (65.4 kg)   BMI 26.37 kg/m    Physical Exam Vitals and nursing note reviewed.  Constitutional:      Appearance: She is well-developed.  HENT:     Head: Normocephalic and atraumatic.  Eyes:     Conjunctiva/sclera: Conjunctivae normal.  Cardiovascular:     Rate and Rhythm: Normal rate and regular rhythm.     Heart sounds: Normal heart sounds.  Pulmonary:     Effort: Pulmonary effort is normal.     Breath sounds: Normal breath sounds.  Abdominal:     General: Bowel sounds are normal.     Palpations: Abdomen is soft.  Musculoskeletal:     Cervical back: Normal range of motion.  Lymphadenopathy:     Cervical: No cervical adenopathy.  Skin:    General: Skin is warm and dry.     Capillary Refill: Capillary refill takes less than 2 seconds.  Neurological:     Mental Status: She is alert and oriented to person, place, and time.  Psychiatric:        Behavior: Behavior normal.      Musculoskeletal Exam: C-spine has good range of motion.  Shoulder joints, elbow joints, wrist joints with good range of motion.  She has bilateral severe PIP and DIP thickening consistent with osteoarthritis.  Hip joints and knee joints with good range of motion.  She had no tenderness over MTPs.  No synovitis was noted on the examination today.  CDAI Exam: CDAI Score: -- Patient Global: --; Provider Global: -- Swollen: --; Tender: -- Joint Exam 01/26/2020   No joint  exam has been documented for this visit   There is currently no information documented on the homunculus. Go to the Rheumatology activity and complete the homunculus joint exam.  Investigation: No additional findings.  Imaging: No results found.  Recent Labs: Lab Results  Component Value Date   WBC 6.1 09/21/2019   HGB 14.4 09/21/2019   PLT 233 09/21/2019   NA 141 09/21/2019   K 5.0 09/21/2019   CL 103 09/21/2019   CO2 21 09/21/2019   GLUCOSE 93 09/21/2019   BUN 20 09/21/2019   CREATININE 0.72 09/21/2019   BILITOT 0.4 09/21/2019   ALKPHOS 82 09/21/2019   AST 21 09/21/2019   ALT 15 09/21/2019   PROT 7.0 10/09/2019   ALBUMIN 4.3 09/21/2019   CALCIUM 9.9 09/21/2019   GFRAA 97 09/21/2019   QFTBGOLDPLUS Negative 09/07/2017   December 28, 2018 ANA 1: 80NS, C3-C4 normal  10/09/19: ANA+, dsDNA-, RNP-, Sm-, Scl-70 1.3, Ro-, La-, chromatin ab-, AntiJO1-, RF-, ESR 10, CRP<1   Speciality Comments: No specialty comments  available.  Procedures:  No procedures performed Allergies: Levofloxacin and Shellfish allergy   Assessment / Plan:     Visit Diagnoses: Positive ANA (antinuclear antibody)-she has low titer ANA 1: 80 NS at SCL 70 is low titer positive.  All other autoimmune antibodies were negative.  Complements are normal.  Patient has no other clinical features of autoimmune disease.  Primary osteoarthritis of both hands-she has severe osteoarthritis in her hands.  Joint protection was discussed.  Chronic idiopathic urticaria-she is having persistent hives despite being on prednisone and Singulair.  She was recently seen in the emergency room.  She is requesting a referral to tertiary care center.  Have advised her to discuss this further with Dr. Ernst Bowler.  Vitiligo  Other specified hypothyroidism - Anti-TPO positive, antithyroglobulin positive.  It is not unusual to see positive ANA with the antithyroid antibodies.  Osteopenia, unspecified location-patient states she had  recent bone density.  I reviewed her DEXA from November 11, 2019 which showed a T score of -2.2 in her right femur.  No comparison value was available.  If she stays on prednisone long-term she will need  bisphosphonate therapy.  Use of calcium and vitamin D was discussed.  Orders: No orders of the defined types were placed in this encounter.  No orders of the defined types were placed in this encounter.     Follow-Up Instructions: Return if symptoms worsen or fail to improve, for +ANA, +Scl 70.   Bo Merino, MD  Note - This record has been created using Editor, commissioning.  Chart creation errors have been sought, but may not always  have been located. Such creation errors do not reflect on  the standard of medical care.

## 2020-01-26 NOTE — Telephone Encounter (Signed)
Seen by rheumatology today and was told by them that she needs to be referred [facility] or [facility] to see what exactly is going on.

## 2020-01-26 NOTE — Telephone Encounter (Signed)
Patient says that she can not keep taking all of this medication. There is another note in the chart from today that was done during my call.

## 2020-01-26 NOTE — Addendum Note (Signed)
Addended by: Valentina Shaggy on: 01/26/2020 04:58 PM   Modules accepted: Orders

## 2020-01-26 NOTE — Telephone Encounter (Signed)
I offered her a referral to [facility] or [facility] a while ago but she declined. I am totally fine with referring her there for another evaluation.  Salvatore Marvel, MD [facility] [place]

## 2020-01-26 NOTE — Telephone Encounter (Signed)
Prescription pended. Please verify and thank you.

## 2020-01-27 NOTE — Telephone Encounter (Signed)
I called the patient this morning to talk to her about the plan for her referral.  I told her that I completely agree with getting a second opinion I always enjoy having other opinions on board.  She is in agreement with the plan.  We will send her to see Dr. Harrietta Guardian at [place], MD [facility] [place]

## 2020-01-28 NOTE — Telephone Encounter (Signed)
Patient is scheduled to see Dr Marcelline Deist on 03/24/20 @ 9:30 with an arrival time of 9:15. Patient was placed on a cancellation list. [facility] [address], [place] [address] [phone]  Patient has been informed of this information.  Thanks

## 2020-02-01 ENCOUNTER — Telehealth: Payer: Self-pay | Admitting: Allergy & Immunology

## 2020-02-01 NOTE — Telephone Encounter (Signed)
I spoke with patient and informed her that the prescription for the sulfasalazine was sent prior to the request for the referral. I let her know that per verbal conversation with Dr. Ernst Bowler she should not get that prescription but should continue with the [place] along with her other medications. She wanted me to know that the pharmacy gave her the prescription when she went to pick up the prednisone so she does have it but will not take it. She also wanted it noted that she is not going to take the hydroxyzine as she feels like she is overdosed on medications.

## 2020-02-01 NOTE — Telephone Encounter (Signed)
Patient called and talk with dr Neldon Mc yesterday and he called in predisone for her hives until she goes to  [facility]. She said that there was a rx for sulfasalzine , and she want to know what it is and what it is for.     [phone].

## 2020-02-01 NOTE — Telephone Encounter (Signed)
Noted.   Cathy Marvel, MD [facility] [place]

## 2020-02-05 ENCOUNTER — Ambulatory Visit: Payer: Medicare Other | Admitting: Allergy & Immunology

## 2020-02-05 ENCOUNTER — Encounter: Payer: Self-pay | Admitting: Allergy & Immunology

## 2020-02-05 ENCOUNTER — Other Ambulatory Visit: Payer: Self-pay

## 2020-02-05 VITALS — BP 134/72 | HR 70 | Temp 98.0°F | Resp 18

## 2020-02-05 DIAGNOSIS — R768 Other specified abnormal immunological findings in serum: Secondary | ICD-10-CM

## 2020-02-05 DIAGNOSIS — L501 Idiopathic urticaria: Secondary | ICD-10-CM | POA: Diagnosis not present

## 2020-02-05 DIAGNOSIS — J302 Other seasonal allergic rhinitis: Secondary | ICD-10-CM

## 2020-02-05 DIAGNOSIS — J3089 Other allergic rhinitis: Secondary | ICD-10-CM

## 2020-02-05 MED ORDER — METHYLPREDNISOLONE ACETATE 40 MG/ML IJ SUSP
40.0000 mg | Freq: Once | INTRAMUSCULAR | Status: AC
  Administered 2020-02-05: 40 mg via INTRAMUSCULAR

## 2020-02-05 NOTE — Patient Instructions (Addendum)
1. Chronic urticaria with recurrent anaphylaxis episodes - Our goal right now is to just keep you comfortable until the appointment at [place] given today in clinic. - STOP the Singulair (montelukast). - Increase prednisone 10mg  to twice daily for now. - Continue with Zyrtec (cetirizine) 20mg  twice daily. - Continue with Pepcid (famotidine) 20mg  twice daily.  - Call us with an update on Monday.   2. Elevated ANA  - Rheumatology has signed off.   3. Follow up as scheduled.    Please inform us of any Emergency Department visits, hospitalizations, or changes in symptoms. Call us before going to the ED for breathing or allergy symptoms since we might be able to fit you in for a sick visit. Feel free to contact us anytime with any questions, problems, or concerns.  It was a pleasure to see you again today!  Websites that have reliable patient information: 1. American Academy of Asthma, Allergy, and Immunology: www.aaaai.org 2. Food Allergy Research and Education (FARE): foodallergy.org 3. Mothers of Asthmatics: http://www.asthmacommunitynetwork.org 4. American College of Allergy, Asthma, and Immunology: www.acaai.org   COVID-19 Vaccine Information can be found at: ShippingScam.co.uk For questions related to vaccine distribution or appointments, please email vaccine@ [email or web address] or call [phone].     "Like" [place] on Facebook and Instagram for our latest updates!        Make sure you are registered to vote! If you have moved or changed any of your contact information, you will need to get this updated before voting!  In some cases, you MAY be able to register to vote online: [email or web address]

## 2020-02-05 NOTE — Progress Notes (Signed)
FOLLOW UP  Date of Service/Encounter:  02/05/20   Assessment:   Chronic idiopathic urticaria- with absolutely no correlation with foods clinically  Currently Xolair 300mg  monthly (x 2 doses)  Abnormality of tongue- with normal ENT evaluation  Multiple food sensitizations (egg, milk, wheat, peanut) - with clinical tolerance   Elevated ANA- with pending appointment with Rheumatology  Plan/Recommendations:   1. Chronic urticaria with recurrent anaphylaxis episodes - Our goal right now is to just keep you comfortable until the appointment at [place] given today in clinic. - STOP the Singulair (montelukast). - Increase prednisone 10mg  to twice daily for now. - Continue with Zyrtec (cetirizine) 20mg  twice daily. - Continue with Pepcid (famotidine) 20mg  twice daily.  - Call us with an update on Monday.   2. Elevated ANA  - Rheumatology has signed off.   3. Follow up as scheduled.    Subjective:   [name] is a 74 y.o. female presenting today for follow up of  Chief Complaint  Patient presents with  . Pruritis    patient is complaining of ongoing and worsening itching despite her current medication regimen.     [name] has a history of the following: Patient Active Problem List   Diagnosis Date Noted  . Chronic idiopathic urticaria 11/23/2019  . Acute urticaria 10/19/2019  . Elevated antinuclear antibody (ANA) level 10/19/2019  . 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 S/P cataract surgery 09/18/2018  . Vitiligo 04/07/2015  . Hypothyroid 05/29/2011  . Allergic rhinitis due to pollen 05/29/2011  . Osteopenia 05/29/2011    History obtained from: chart review and patient.  [name] is a 74 y.o. female presenting for a follow up visit.  She was last seen in the office on July 6 by one of our nurse practitioners, Altha Harm.  At that time, she was continued on famotidine 1 tablet twice daily and her cetirizine was  increased to 2 tablets twice daily.  She was continued on Singulair 10 mg daily as well as Xolair every 4 weeks.  Of note, she does have a history of an elevated ANA and is followed by rheumatology.  Her last visit with him was July 13.  At that time, she discussed with Dr. Estanislado Pandy regarding a referral to a tertiary care center.  She is currently taking: cetirizine 20mg  BID, famotidine 20mg  BID, Singulair 10mg  QD, and prednisone 10mg  daily. She is still covered in hives on her arms, back, and thighs. She has had no anaphylaxis symptoms whatsoever. She is miserable. However, when we discuss taking some medications off of her list, she is not thrilled with the idea, as she does "not know what is working and what is not working".   She has been harassing [facility] and has gotten her appointment moved up to August 12th (originally scheduled in September).   Otherwise, there have been no changes to her past medical history, surgical history, family history, or social history.    Review of Systems  Constitutional: Negative.  Negative for chills, fever, malaise/fatigue and weight loss.  HENT: Negative.  Negative for congestion, ear discharge, ear pain and sinus pain.   Eyes: Negative for pain, discharge and redness.  Respiratory: Negative for cough, sputum production, shortness of breath and wheezing.   Cardiovascular: Negative.  Negative for chest pain and palpitations.  Gastrointestinal: Negative for abdominal pain, constipation, diarrhea, heartburn, nausea and vomiting.  Skin: Positive for itching and rash.  Neurological: Negative for dizziness  and headaches.  Endo/Heme/Allergies: Negative for environmental allergies. Does not bruise/bleed easily.       Objective:   Blood pressure (!) 134/72, pulse 70, temperature 98 F (36.7 C), temperature source Temporal, resp. rate 18, SpO2 98 %. There is no height or weight on file to calculate BMI.   Physical Exam:  Physical  Exam Constitutional:      Appearance: She is well-developed.  HENT:     Head: Normocephalic and atraumatic.     Right Ear: Tympanic membrane, ear canal and external ear normal.     Left Ear: Tympanic membrane, ear canal and external ear normal.     Nose: No nasal deformity, septal deviation, mucosal edema or rhinorrhea.     Right Turbinates: Not enlarged or swollen.     Left Turbinates: Not enlarged or swollen.     Right Sinus: No maxillary sinus tenderness or frontal sinus tenderness.     Left Sinus: No maxillary sinus tenderness or frontal sinus tenderness.     Mouth/Throat:     Mouth: Mucous membranes are not pale and not dry.     Pharynx: Uvula midline.  Eyes:     General:        Right eye: No discharge.        Left eye: No discharge.     Conjunctiva/sclera: Conjunctivae normal.     Right eye: Right conjunctiva is not injected. No chemosis.    Left eye: Left conjunctiva is not injected. No chemosis.    Pupils: Pupils are equal, round, and reactive to light.  Cardiovascular:     Rate and Rhythm: Normal rate and regular rhythm.     Heart sounds: Normal heart sounds.  Pulmonary:     Effort: Pulmonary effort is normal. No tachypnea, accessory muscle usage or respiratory distress.     Breath sounds: Normal breath sounds. No wheezing, rhonchi or rales.  Chest:     Chest wall: No tenderness.  Lymphadenopathy:     Cervical: No cervical adenopathy.  Skin:    Coloration: Skin is not pale.     Findings: No abrasion, erythema, petechiae or rash. Rash is not papular, urticarial or vesicular.     Comments: Hives over arms, thighs, and back as well as the left hand. These lesions are blanchable.   Neurological:     Mental Status: She is alert.      Diagnostic studies: none      Salvatore Marvel, MD  [facility] [place]

## 2020-02-08 DIAGNOSIS — H26493 Other secondary cataract, bilateral: Secondary | ICD-10-CM | POA: Diagnosis not present

## 2020-02-09 ENCOUNTER — Other Ambulatory Visit: Payer: Self-pay | Admitting: Allergy

## 2020-02-09 NOTE — Progress Notes (Signed)
Called by pt.  She states she is having lip swelling upper and lower lip since about 5pm today.   Taking prednisone 1 in morning and 1 at night Zyrtec 2 in morning and 2 in pm Pepcid 1 tab twice a day.   She states she has been feeling more jittery on the prednisone. She had eye surgery yesterday as well and is using a steroid eye drop.   She denies any difficulty breathing or swallowing and no systemic symtpoms .  She does have access to epipen and discussed symtposm/reasons to administer and call 911 if she does need to administer.  Discussed with hives/swelling like hers we usually do not see progression to systemic symptoms.  Advised since she is maxxed out on antihistamine dosing reccommended tonight she take prednisone 20mg  and another 20mg  in the morning.  Advised office will give her call tomorrow regarding further therapy.

## 2020-02-10 ENCOUNTER — Telehealth: Payer: Self-pay | Admitting: Allergy & Immunology

## 2020-02-10 ENCOUNTER — Ambulatory Visit: Payer: Medicare Other | Admitting: Allergy & Immunology

## 2020-02-10 MED ORDER — DOXEPIN HCL 25 MG PO CAPS: ORAL_CAPSULE | ORAL | 0 refills | Status: DC

## 2020-02-10 NOTE — Progress Notes (Signed)
Called and spoke to patient and she has been advised of the changes and expressed understanding. Doxepin has been sent to patient's pharmacy.

## 2020-02-10 NOTE — Progress Notes (Signed)
Please direct messages to Dr. Ernst Bowler

## 2020-02-10 NOTE — Addendum Note (Signed)
Addended by: Jamelle Haring B on: 02/10/2020 04:38 PM   Modules accepted: Orders

## 2020-02-10 NOTE — Telephone Encounter (Signed)
Patient had to call Dr. Nelva Bush, on call last night. She has swollen lips and hives. She increased her prednisone. She said she was told someone from our office would call her today.

## 2020-02-10 NOTE — Telephone Encounter (Signed)
Left message regarding symptoms.

## 2020-02-10 NOTE — Progress Notes (Signed)
We are going to change her regimen.  Prednisone: two tablets BID for four days, then one tablet BID thereafter  Stop the Zyrtec and start Allegra two tablets twice daily.  Increase Pepcid to two tablets (40mg ) BID.   Add Doxepin 25-50mg  at night to help with sleep/itch.   Has she called to get a sooner appointment with [facility]?   Salvatore Marvel, MD Allergy and [facility] [place]

## 2020-02-10 NOTE — Telephone Encounter (Signed)
Patient called back and states the swelling and hives are some better. She took 2 10mg  prednisone, 2 zyrtec and 1 pepcid this morning. Please advise on next steps.

## 2020-02-10 NOTE — Progress Notes (Signed)
Attempted to call patient today but unable to reach patient. Left message since patient called again in the [place] office regarding her symptoms.

## 2020-02-11 ENCOUNTER — Telehealth: Payer: Self-pay | Admitting: Allergy & Immunology

## 2020-02-11 MED ORDER — PREDNISONE 10 MG PO TABS: ORAL_TABLET | ORAL | 0 refills | Status: DC

## 2020-02-11 NOTE — Telephone Encounter (Signed)
Dr. Ernst Bowler, Please advise as to what patient is talking about since script was not sent in yesterday

## 2020-02-11 NOTE — Telephone Encounter (Signed)
Patient was advised and prednisone.

## 2020-02-11 NOTE — Addendum Note (Signed)
Addended by: Valere Dross on: 02/11/2020 11:32 AM   Modules accepted: Orders

## 2020-02-11 NOTE — Progress Notes (Signed)
Patient was given prednisone for her healthcare symptoms.

## 2020-02-11 NOTE — Telephone Encounter (Signed)
Patient called and said that the predisone was not called in correctly and she needs a new rx called in for 4 tablets a day and need refills on that too. [facility] //[phone].

## 2020-02-12 ENCOUNTER — Ambulatory Visit: Payer: Medicare Other | Admitting: Rheumatology

## 2020-02-17 ENCOUNTER — Other Ambulatory Visit: Payer: Self-pay

## 2020-02-17 ENCOUNTER — Other Ambulatory Visit: Payer: Self-pay | Admitting: Allergy & Immunology

## 2020-02-17 MED ORDER — PREDNISONE 10 MG PO TABS: 10.0000 mg | ORAL_TABLET | Freq: Two times a day (BID) | ORAL | 0 refills | Status: DC

## 2020-02-19 ENCOUNTER — Other Ambulatory Visit: Payer: Self-pay

## 2020-02-19 ENCOUNTER — Ambulatory Visit (INDEPENDENT_AMBULATORY_CARE_PROVIDER_SITE_OTHER): Payer: Medicare Other

## 2020-02-19 DIAGNOSIS — L501 Idiopathic urticaria: Secondary | ICD-10-CM | POA: Diagnosis not present

## 2020-02-25 DIAGNOSIS — L501 Idiopathic urticaria: Secondary | ICD-10-CM | POA: Diagnosis not present

## 2020-02-25 DIAGNOSIS — T781XXA Other adverse food reactions, not elsewhere classified, initial encounter: Secondary | ICD-10-CM | POA: Diagnosis not present

## 2020-02-25 DIAGNOSIS — Z79899 Other long term (current) drug therapy: Secondary | ICD-10-CM | POA: Diagnosis not present

## 2020-02-26 ENCOUNTER — Other Ambulatory Visit (HOSPITAL_COMMUNITY): Payer: Medicare Other

## 2020-03-02 ENCOUNTER — Telehealth: Payer: Self-pay | Admitting: Allergy & Immunology

## 2020-03-02 NOTE — Telephone Encounter (Signed)
Patient stated she has been on prednisone for the last 8 months and just took her last pill Monday and wants to know how lng she has to wait before getting her 2nd dose of the COVID vaccine

## 2020-03-02 NOTE — Telephone Encounter (Signed)
With how long she has been on it, I would recommend waiting one month.  Do we need to increase her Xolair to every two weeks? Is she continuing to have breakthrough hives?   Salvatore Marvel, MD Allergy and [place] [facility] [place]

## 2020-03-02 NOTE — Telephone Encounter (Signed)
Patient has been advised and states she took her last prednisone on Monday.   She has not had any breakthrough hives at this time.

## 2020-03-03 ENCOUNTER — Other Ambulatory Visit: Payer: Self-pay | Admitting: Family

## 2020-03-03 NOTE — Telephone Encounter (Signed)
Do not refill. It looks like she is currently not taking this medication from looking at her last visit with [place] on 02/05/20 and from her  second opinion allergy appointment on 02/25/20.

## 2020-03-10 ENCOUNTER — Ambulatory Visit (HOSPITAL_COMMUNITY)
Admission: RE | Admit: 2020-03-10 | Discharge: 2020-03-10 | Disposition: A | Discharge status: Home / Self Care | Payer: Medicare Other | Source: Ambulatory Visit | Attending: Pulmonary Disease | Admitting: Pulmonary Disease

## 2020-03-10 ENCOUNTER — Other Ambulatory Visit: Payer: Self-pay

## 2020-03-10 DIAGNOSIS — R918 Other nonspecific abnormal finding of lung field: Secondary | ICD-10-CM | POA: Insufficient documentation

## 2020-03-10 DIAGNOSIS — I700 Atherosclerosis of aorta: Secondary | ICD-10-CM | POA: Diagnosis not present

## 2020-03-10 DIAGNOSIS — R590 Localized enlarged lymph nodes: Secondary | ICD-10-CM | POA: Diagnosis not present

## 2020-03-10 DIAGNOSIS — R911 Solitary pulmonary nodule: Secondary | ICD-10-CM | POA: Diagnosis not present

## 2020-03-11 ENCOUNTER — Ambulatory Visit: Payer: Medicare Other | Admitting: Allergy & Immunology

## 2020-03-11 ENCOUNTER — Encounter: Payer: Self-pay | Admitting: Allergy & Immunology

## 2020-03-11 VITALS — BP 120/76 | HR 80 | Resp 18

## 2020-03-11 DIAGNOSIS — L501 Idiopathic urticaria: Secondary | ICD-10-CM

## 2020-03-11 DIAGNOSIS — R768 Other specified abnormal immunological findings in serum: Secondary | ICD-10-CM | POA: Diagnosis not present

## 2020-03-11 MED ORDER — FAMOTIDINE 40 MG PO TABS: 40.0000 mg | ORAL_TABLET | Freq: Two times a day (BID) | ORAL | 11 refills | Status: DC

## 2020-03-11 NOTE — Patient Instructions (Addendum)
1. Chronic urticaria with recurrent anaphylaxis episodes - Continue with Allegra two tablets in the morning and two tablets at night.  - Continue with Pepcid 40mg  twice daily (sent in script). - I would go ahead and get the generic Allegra (FEXOFENADINE). - Continue with Xolair. - Continue with the same regimen until the next visit.   2. Follow up in three months.    Please inform us of any Emergency Department visits, hospitalizations, or changes in symptoms. Call us before going to the ED for breathing or allergy symptoms since we might be able to fit you in for a sick visit. Feel free to contact us anytime with any questions, problems, or concerns.  It was a pleasure to see you again today!  Websites that have reliable patient information: 1. American Academy of Asthma, Allergy, and Immunology: www.aaaai.org 2. Food Allergy Research and Education (FARE): foodallergy.org 3. Mothers of Asthmatics: http://www.asthmacommunitynetwork.org 4. American College of Allergy, Asthma, and Immunology: www.acaai.org   COVID-19 Vaccine Information can be found at: ShippingScam.co.uk For questions related to vaccine distribution or appointments, please email [email or web address] or call [phone].     "Like" [place] on Facebook and Instagram for our latest updates!        Make sure you are registered to vote! If you have moved or changed any of your contact information, you will need to get this updated before voting!  In some cases, you MAY be able to register to vote online: [email or web address]

## 2020-03-11 NOTE — Progress Notes (Signed)
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 Chronic urticaria with recurrent anaphylaxis episodes - Continue with Allegra two tablets in the morning and two tablets at night.  - Continue with Pepcid 40mg  twice daily (sent in script). - I would go ahead and get the generic Allegra (FEXOFENADINE). - Continue with Xolair. - Continue with the same regimen until the next visit.   2. Follow up in three months.   Subjective:   [name] is a 74 y.o. female presenting today for follow up of  Chief Complaint  Patient presents with  . Urticaria    patient is doing phenomenally well. no more hives in a few weeks. she has been able to stop the prednisone and this has made her feel so much better.     [name] has a history of the following: Patient Active Problem List   Diagnosis Date Noted  . Chronic idiopathic urticaria 11/23/2019  . Acute urticaria 10/19/2019  . Elevated antinuclear antibody (ANA) level 10/19/2019  . 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 S/P cataract surgery 09/18/2018  . Vitiligo 04/07/2015  . Hypothyroid 05/29/2011  . Allergic rhinitis due to pollen 05/29/2011  . Osteopenia 05/29/2011    History obtained from: chart review and patient.  [name] is a 74 y.o. female presenting for a follow up visit.  She has a history of chronic idiopathic urticaria.  We have been trying to manage this for several months now, but she has never been very compliant with our recommendations.  This is necessitated the need for multiple rounds of prednisone.  We finally were able to get her to start Xolair and she was doing well with this.  However her  hives were still not completely controlled and she requested a second opinion which I was happy to offer.  We sent her to see Dr. Marcelline Deist, he did not feel that any further labs were necessary.  He also changed her to Allegra 2 tablets twice daily as well as Pepcid 2 tablets twice daily.  He emphasized the need to get off of prednisone which she was finally able to do.  Since the last visit, she has done well.  She is doing Allegra two tablets BID and Pepcid two tablets BID. She has had not had any problems with hives. Her last prednisone was 02/29/20. She has done very well since that time. Dr. Marcelline Deist emphasized that there was no reason for the hives.  She is very thankful of our care.  She is wondering about the duration of the Xolair treatment. She is very happy to be off of the prednisone.  Otherwise, there have been no changes to her past medical history, surgical history, family history, or social history.    Review of Systems  Constitutional: Negative.  Negative for chills, fever, malaise/fatigue and weight loss.  HENT: Negative.  Negative for congestion, ear discharge, ear pain and sore throat.   Eyes: Negative for pain, discharge and redness.  Respiratory: Negative for cough, sputum production, shortness of breath and wheezing.   Cardiovascular: Negative.  Negative for chest pain and palpitations.  Gastrointestinal: Negative for abdominal pain, constipation, diarrhea, heartburn, nausea and vomiting.  Skin: Negative.  Negative for  itching and rash.  Neurological: Negative for dizziness and headaches.  Endo/Heme/Allergies: Negative for environmental allergies. Does not bruise/bleed easily.       Objective:   Blood pressure 120/76, pulse 80, resp. rate 18, SpO2 99 %. There is no height or weight on file to calculate BMI.   Physical Exam:  Physical Exam Constitutional:      Appearance: She is well-developed.     Comments: Talkative female.  Very thankful.  HENT:     Head:  Normocephalic and atraumatic.     Right Ear: Tympanic membrane, ear canal and external ear normal.     Left Ear: Tympanic membrane, ear canal and external ear normal.     Nose: No nasal deformity, septal deviation, mucosal edema or rhinorrhea.     Right Turbinates: Enlarged and swollen.     Left Turbinates: Enlarged and swollen.     Right Sinus: No maxillary sinus tenderness or frontal sinus tenderness.     Left Sinus: No maxillary sinus tenderness or frontal sinus tenderness.     Mouth/Throat:     Mouth: Mucous membranes are not pale and not dry.     Pharynx: Uvula midline.     Comments: Cobblestoning present in the posterior oropharynx. Eyes:     General:        Right eye: No discharge.        Left eye: No discharge.     Conjunctiva/sclera: Conjunctivae normal.     Right eye: Right conjunctiva is not injected. No chemosis.    Left eye: Left conjunctiva is not injected. No chemosis.    Pupils: Pupils are equal, round, and reactive to light.  Cardiovascular:     Rate and Rhythm: Normal rate and regular rhythm.     Heart sounds: Normal heart sounds.  Pulmonary:     Effort: Pulmonary effort is normal. No tachypnea, accessory muscle usage or respiratory distress.     Breath sounds: Normal breath sounds. No wheezing, rhonchi or rales.     Comments: Moving air well in all lung fields.  No increased work of breathing. Chest:     Chest wall: No tenderness.  Lymphadenopathy:     Cervical: No cervical adenopathy.  Skin:    Coloration: Skin is not pale.     Findings: No abrasion, erythema, petechiae or rash. Rash is not papular, urticarial or vesicular.     Comments: No eczematous or urticarial lesions noted.  Neurological:     Mental Status: She is alert.  Psychiatric:        Behavior: Behavior is cooperative.      Diagnostic studies: none    Salvatore Marvel, MD  [facility]

## 2020-03-15 ENCOUNTER — Other Ambulatory Visit: Payer: Self-pay

## 2020-03-15 ENCOUNTER — Telehealth: Payer: Self-pay | Admitting: Pulmonary Disease

## 2020-03-15 DIAGNOSIS — R946 Abnormal results of thyroid function studies: Secondary | ICD-10-CM

## 2020-03-15 NOTE — Telephone Encounter (Signed)
Pt was made aware of the CT results by Tery Sanfilippo today 8/31. Nothing further needed.

## 2020-03-15 NOTE — Progress Notes (Signed)
Called patient with results of recent CT scan. Provided notes from Dr. Vaughan Browner. Patient verbalized understanding of results. She has a thyroid US on 03/17/20.

## 2020-03-17 ENCOUNTER — Ambulatory Visit
Admission: RE | Admit: 2020-03-17 | Discharge: 2020-03-17 | Disposition: A | Discharge status: Home / Self Care | Payer: Medicare Other | Source: Ambulatory Visit | Attending: Family Medicine | Admitting: Family Medicine

## 2020-03-17 ENCOUNTER — Ambulatory Visit (INDEPENDENT_AMBULATORY_CARE_PROVIDER_SITE_OTHER): Payer: Medicare Other | Admitting: Allergy & Immunology

## 2020-03-17 ENCOUNTER — Other Ambulatory Visit: Payer: Self-pay

## 2020-03-17 ENCOUNTER — Encounter: Payer: Self-pay | Admitting: Allergy & Immunology

## 2020-03-17 VITALS — BP 104/82 | HR 68 | Temp 96.9°F | Resp 16 | Ht 62.0 in | Wt 142.4 lb

## 2020-03-17 DIAGNOSIS — J302 Other seasonal allergic rhinitis: Secondary | ICD-10-CM | POA: Diagnosis not present

## 2020-03-17 DIAGNOSIS — J3089 Other allergic rhinitis: Secondary | ICD-10-CM

## 2020-03-17 DIAGNOSIS — L501 Idiopathic urticaria: Secondary | ICD-10-CM

## 2020-03-17 DIAGNOSIS — E041 Nontoxic single thyroid nodule: Secondary | ICD-10-CM | POA: Diagnosis not present

## 2020-03-17 DIAGNOSIS — R946 Abnormal results of thyroid function studies: Secondary | ICD-10-CM

## 2020-03-17 NOTE — Progress Notes (Signed)
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 Chronic urticaria with recurrent anaphylaxis episodes - Hopefully over the weekend, with taking the extra Allegra today, your lip swelling will improve.  - Continue with Allegra two tablets in the morning and two tablets at night.  - Continue with Pepcid 40mg  twice daily (sent in script). - We are going try to increase the Xolair to every two weeks, which should help you to get off of some of these daily medications.  - Try using on hydroxyzine tonight (this can be used as an anxiety medication and you probably have it at home). - I would follow-up on the ultrasound of the weekend and give you a call.  2. Follow up in three months.    Subjective:   [name] is a 74 y.o. female presenting today for follow up of  Chief Complaint  Patient presents with  . Allergic Reaction    Hives ans lip swelling. Says she forgot her night dose of medication and woke up in hives and the swelling. She took 4 allegra and 4 pepcid this morning about 10 this morning    [name] has a history of the following: Patient Active Problem List   Diagnosis Date Noted  . Chronic idiopathic urticaria 11/23/2019  . Acute urticaria 10/19/2019  . Elevated antinuclear antibody (ANA) level 10/19/2019  . 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 S/P cataract surgery 09/18/2018  . Vitiligo 04/07/2015  . Hypothyroid 05/29/2011  . Allergic rhinitis due to pollen 05/29/2011  . Osteopenia 05/29/2011     History obtained from: chart review and patient.  [name] is a 74 y.o. female presenting for a follow up visit.  She has a history of chronic idiopathic urticaria.  It is proven difficult to control.  We have tried a multitude of different medications, but her compliance has been lackluster at best.  We eventually got her to attempt Xolair which has slowly improved her symptoms.  She has been evaluated by Dr. Marcelline Deist he did not have any additional recommendations but did emphasize to her that she needed to get the Xolair time to work and emphasized that she did not have any food allergies.  Her work-up has been notable for positive ANA which rheumatology has not been excited about due to the low titer.  Interestingly, she also had elevated antithyroid antibodies.  She was last seen in August 2021 around 1 week ago.  At that time, she was actually doing quite well on Allegra and Pepcid twice daily.  She was also on the Xolair.  She is very happy with how she was doing.  He is scheduled to get her next Xolair tomorrow in [place].  This morning, she awoke with tingling of her lips.  She realized that she forgot to take her nighttime doses of Allegra and Pepcid.  She did take her morning doses and call this for further guidance.  She is not having any systemic symptoms including shortness of breath, therefore recommended that she take her nighttime doses as well which she did.  Over the  course of the day, her symptoms have slowly improved and honestly are not noticeable today in clinic.  She does have a history of nodules in her lungs.  She is followed by Dr. Rolla Etienne for this and she had a repeat chest CT that showed that they had resolved.  This is good news, but unfortunately also showed that her thyroid was enlarged.  She is on levothyroxine and therefore has a known history of thyroid disease.  She was scheduled for a thyroid ultrasound and had that done today before she came to see [place].  These  results are pending.  She is rather anxious that it is cancer.  She also shares with me that she is planning to break-up with her boyfriend of 4 years.  He apparently sounds like a selfish jerk.  He refused the shingles vaccine and now has a raging case of shingles.  He is also refused the COVID-19 vaccine and both vaccine and her kids are not happy that she is putting herself at risk.  CT CHEST IMPRESSION (8.26.21): 1. Previously noted nodule in the superior segment right lower lobe is not present on current examination, consistent with resolved sequelae of infection or inflammation. 2. Mild mosaic attenuation of the airspaces, suggestive of small airways disease. 3. Enlarged, heterogeneous left lobe of the thyroid. Recommend thyroid ultrasound (ref: J Am Coll Radiol. 2015 Feb;12(2): 143-50). 4. Aortic Atherosclerosis (ICD10-I70.0).  Otherwise, there have been no changes to her past medical history, surgical history, family history, or social history.    Review of Systems  Constitutional: Negative.  Negative for fever, malaise/fatigue and weight loss.  HENT: Negative.  Negative for congestion, ear discharge and ear pain.   Eyes: Negative for pain, discharge and redness.  Respiratory: Negative for cough, sputum production, shortness of breath and wheezing.   Cardiovascular: Negative.  Negative for chest pain and palpitations.  Gastrointestinal: Negative for abdominal pain and heartburn.  Skin: Positive for itching and rash.       Positive for angioedema of her lips.  Neurological: Negative for dizziness and headaches.  Endo/Heme/Allergies: Negative for environmental allergies. Does not bruise/bleed easily.       Objective:   Blood pressure 104/82, pulse 68, temperature (!) 96.9 F (36.1 C), resp. rate 16, height 5\' 2"  (1.575 m), weight 142 lb 6.4 oz (64.6 kg), SpO2 98 %. Body mass index is 26.05 kg/m.   Physical Exam:  Physical Exam Constitutional:      Appearance: She is  well-developed.  HENT:     Head: Normocephalic and atraumatic.     Comments: Her lips are slightly edematous, but I would not have even noticed that she had not pointed it out.  Certainly is not to the same extent as when I seen her before during flares.    Right Ear: Tympanic membrane, ear canal and external ear normal.     Left Ear: Tympanic membrane and ear canal normal.     Nose: No nasal deformity, septal deviation, mucosal edema or rhinorrhea.     Right Sinus: No maxillary sinus tenderness or frontal sinus tenderness.     Left Sinus: No maxillary sinus tenderness or frontal sinus tenderness.     Mouth/Throat:     Mouth: Mucous membranes are not pale and not dry.     Pharynx: Uvula midline.  Eyes:     General:        Right eye: No discharge.        Left eye: No discharge.  Conjunctiva/sclera: Conjunctivae normal.     Right eye: Right conjunctiva is not injected. No chemosis.    Left eye: Left conjunctiva is not injected. No chemosis.    Pupils: Pupils are equal, round, and reactive to light.  Cardiovascular:     Rate and Rhythm: Normal rate and regular rhythm.     Heart sounds: Normal heart sounds.  Pulmonary:     Effort: Pulmonary effort is normal. No tachypnea, accessory muscle usage or respiratory distress.     Breath sounds: Normal breath sounds. No wheezing, rhonchi or rales.  Chest:     Chest wall: No tenderness.  Lymphadenopathy:     Cervical: No cervical adenopathy.  Skin:    Coloration: Skin is not pale.     Findings: No abrasion, erythema, petechiae or rash. Rash is not papular, urticarial or vesicular.  Neurological:     Mental Status: She is alert.      Diagnostic studies: none         Salvatore Marvel, MD  [facility]

## 2020-03-17 NOTE — Patient Instructions (Addendum)
1. Chronic urticaria with recurrent anaphylaxis episodes - Hopefully over the weekend, with taking the extra Allegra today, your lip swelling will improve.  - Continue with Allegra two tablets in the morning and two tablets at night.  - Continue with Pepcid 40mg  twice daily (sent in script). - We are going try to increase the Xolair to every two weeks, which should help you to get off of some of these daily medications.  - Try using on hydroxyzine tonight (this can be used as an anxiety medication and you probably have it at home).  2. Follow up in three months.    Please inform us of any Emergency Department visits, hospitalizations, or changes in symptoms. Call us before going to the ED for breathing or allergy symptoms since we might be able to fit you in for a sick visit. Feel free to contact us anytime with any questions, problems, or concerns.  It was a pleasure to see you again today!  Websites that have reliable patient information: 1. American Academy of Asthma, Allergy, and Immunology: www.aaaai.org 2. Food Allergy Research and Education (FARE): foodallergy.org 3. Mothers of Asthmatics: http://www.asthmacommunitynetwork.org 4. American College of Allergy, Asthma, and Immunology: www.acaai.org   COVID-19 Vaccine Information can be found at: ShippingScam.co.uk For questions related to vaccine distribution or appointments, please email vaccine@[place] .com or call [phone].     "Like" [place] on Facebook and Instagram for our latest updates!        Make sure you are registered to vote! If you have moved or changed any of your contact information, you will need to get this updated before voting!  In some cases, you MAY be able to register to vote online: [email or web address]

## 2020-03-18 ENCOUNTER — Ambulatory Visit (INDEPENDENT_AMBULATORY_CARE_PROVIDER_SITE_OTHER): Payer: Medicare Other

## 2020-03-18 DIAGNOSIS — L501 Idiopathic urticaria: Secondary | ICD-10-CM

## 2020-03-20 ENCOUNTER — Ambulatory Visit
Admission: EM | Admit: 2020-03-20 | Discharge: 2020-03-20 | Disposition: A | Discharge status: Home / Self Care | Payer: Medicare Other

## 2020-03-20 ENCOUNTER — Telehealth: Payer: Self-pay | Admitting: Allergy

## 2020-03-20 ENCOUNTER — Other Ambulatory Visit: Payer: Self-pay

## 2020-03-20 DIAGNOSIS — L299 Pruritus, unspecified: Secondary | ICD-10-CM

## 2020-03-20 DIAGNOSIS — L509 Urticaria, unspecified: Secondary | ICD-10-CM

## 2020-03-20 NOTE — Telephone Encounter (Signed)
Patient called Saturday at 11PM about hives not being controlled. She is taking allegra 180mg  2 pills BID, pepcid 40mg  BID. Took hydroxyzine 25mg  1/2 tablet and still not controlled.   Advised her to take benadryl 25mg  every 4-6 hours as needed additionally.   Patient called again Sunday around [place] stating she had a horrible night and the benadryl did not control hives. She is reluctant about taking prednisone as she had a hard time weaning off.  Hydroxyzine does not make her tired. Advised her to take hydroxyzine 25mg  every 8 hours and to stop taking benadryl. Continue with allegra and Pepcid.

## 2020-03-20 NOTE — Discharge Instructions (Signed)
Steroid shot given in office Use prednisone taper as prescribed Continue with hydroxyzine as needed Follow up with allergist next week Return or go to the ER if you have any new or worsening symptoms such as fever, chills, nausea, vomiting, redness, swelling, discharge, if symptoms do not improve with medications, etc..Marland Kitchen

## 2020-03-20 NOTE — ED Triage Notes (Signed)
Pt presents with rash that is reoccurring

## 2020-03-20 NOTE — ED Provider Notes (Signed)
[facility]   [id number] 03/20/20 Arrival Time: 1950  CC: Hives  SUBJECTIVE:  [name] is a 74 y.o. female who presents with acute on chronic hives x few days.  Had thryoid [place] and broke up with female friend recently.  Localizes the rash to shoulders, torso, and scalp.  Describes it as itchy.  Has tried hydroxyzine without relief.  Symptoms are made worse with scratching.  Reports similar symptoms in the past and has been on prednisone, but instructed to stop by allergist since she had been on it for so long.  Did have some lip swelling, but now resolved.   Denies fever, chills, nausea, vomiting, swelling, discharge, trouble swallowing, difficulty breathing, SOB, chest pain, abdominal pain, changes in bowel or bladder function.    ROS: As per HPI.  All other pertinent ROS negative.     Past Medical History:  Diagnosis Date  . Allergy    RHINITIS  . Osteoporosis    OSTEOPENIA  . Thyroid disease    HYPOTHYROID  . Urticaria    Past Surgical History:  Procedure Laterality Date  . APPENDECTOMY    . arm surgery Right   . CATARACT EXTRACTION, BILATERAL Bilateral   . CHOLECYSTECTOMY    . SINOSCOPY    . SKIN BIOPSY Left 02/28/2018   shave lower left back neurofibroma   Allergies  Allergen Reactions  . Levofloxacin   . Shellfish Allergy    Current Facility-Administered Medications on File Prior to Encounter  Medication Dose Route Frequency Provider Last Rate Last Admin  . omalizumab Arvid Right) injection 300 mg  300 mg Subcutaneous Q28 days Valentina Shaggy, MD   300 mg at 03/18/20 9326   Current Outpatient Medications on File Prior to Encounter  Medication Sig Dispense Refill  . aspirin 81 MG tablet Take 81 mg by mouth daily.      . Ca Phosphate-Cholecalciferol (CALTRATE GUMMY BITES) 250-400 MG-UNIT CHEW Chew 1,000 mg by mouth daily.    Marland Kitchen doxepin (SINEQUAN) 25 MG capsule 25-50 mg at night to help with sleep/itch. (Patient not taking: Reported on 03/17/2020) 30  capsule 0  . EPINEPHrine 0.3 mg/0.3 mL IJ SOAJ injection Inject 0.3 mLs (0.3 mg total) into the muscle as needed for anaphylaxis. 1 each 1  . famotidine (PEPCID) 40 MG tablet Take 1 tablet (40 mg total) by mouth 2 (two) times daily. 60 tablet 11  . fexofenadine (ALLEGRA) 180 MG tablet Take 1 tablet by mouth in the morning and at bedtime.    Marland Kitchen levothyroxine (SYNTHROID) 100 MCG tablet TAKE 1 TABLET(100 MCG) BY MOUTH DAILY 90 tablet 3  . Multiple Vitamins-Minerals (MULTIVITAMIN WITH MINERALS) tablet Take 1 tablet by mouth daily.     Social History   Socioeconomic History  . Marital status: Widowed    Spouse name: Not on file  . Number of children: Not on file  . Years of education: Not on file  . Highest education level: Not on file  Occupational History  . Not on file  Tobacco Use  . Smoking status: Never Smoker  . Smokeless tobacco: Never Used  Vaping Use  . Vaping Use: Never used  Substance and Sexual Activity  . Alcohol use: No  . Drug use: No  . Sexual activity: Yes  Other Topics Concern  . Not on file  Social History Narrative  . Not on file   Social Determinants of Health   Financial Resource Strain:   . Difficulty of Paying Living Expenses: Not on  file  Food Insecurity:   . Worried About Charity fundraiser in the Last Year: Not on file  . Ran Out of Food in the Last Year: Not on file  Transportation Needs:   . Lack of Transportation (Medical): Not on file  . Lack of Transportation (Non-Medical): Not on file  Physical Activity:   . Days of Exercise per Week: Not on file  . Minutes of Exercise per Session: Not on file  Stress:   . Feeling of Stress : Not on file  Social Connections:   . Frequency of Communication with Friends and Family: Not on file  . Frequency of Social Gatherings with Friends and Family: Not on file  . Attends Religious Services: Not on file  . Active Member of Clubs or Organizations: Not on file  . Attends Archivist Meetings: Not  on file  . Marital Status: Not on file  Intimate Partner Violence:   . Fear of Current or Ex-Partner: Not on file  . Emotionally Abused: Not on file  . Physically Abused: Not on file  . Sexually Abused: Not on file   Family History  Problem Relation Age of Onset  . Stroke Mother   . Arthritis Mother   . Allergic rhinitis Mother   . Allergic rhinitis Brother   . Allergic rhinitis Maternal Grandmother   . Parkinson's disease Brother   . Asthma Neg Hx   . Eczema Neg Hx   . Urticaria Neg Hx     OBJECTIVE: Vitals:   03/20/20 1622  BP: 129/77  Pulse: 88  Resp: 20  Temp: 98.1 F (36.7 C)  SpO2: 97%    General appearance: alert; no distress Head: NCAT ENT: Oropharynx clear Lungs: normal respiratory effort Extremities: no edema Skin: warm and dry; urticarial rash diffuse about the shoulders, torso, scalp, NTTP, no bleeding or discharge Psychological: alert and cooperative; normal mood and affect  ASSESSMENT & PLAN:  1. Hives   2. Itching    Steroid shot given in office Use prednisone taper as prescribed Continue with hydroxyzine as needed Follow up with allergist next week Return or go to the ER if you have any new or worsening symptoms such as fever, chills, nausea, vomiting, redness, swelling, discharge, if symptoms do not improve with medications, etc...  Reviewed expectations re: course of current medical issues. Questions answered. Outlined signs and symptoms indicating need for more acute intervention. Patient verbalized understanding. After Visit Summary given.   Lestine Box, PA-C 03/20/20 1646

## 2020-03-21 NOTE — Telephone Encounter (Signed)
Patient called on Sunday afternoon again stating that hydroxyzine is not helping.  She has prednisone at home. I told her that taking prednisone right now will not have any immediate relief.  Patient is miserable. I told her that she should get a steroid injection for quicker relief but our office is closed until Tuesday.  Recommended that she goes to local urgent care.

## 2020-03-22 ENCOUNTER — Other Ambulatory Visit: Payer: Self-pay

## 2020-03-22 ENCOUNTER — Ambulatory Visit (INDEPENDENT_AMBULATORY_CARE_PROVIDER_SITE_OTHER): Payer: Medicare Other | Admitting: Family Medicine

## 2020-03-22 ENCOUNTER — Other Ambulatory Visit: Payer: Self-pay | Admitting: *Deleted

## 2020-03-22 ENCOUNTER — Ambulatory Visit (INDEPENDENT_AMBULATORY_CARE_PROVIDER_SITE_OTHER): Payer: Medicare Other | Admitting: Allergy & Immunology

## 2020-03-22 ENCOUNTER — Encounter: Payer: Self-pay | Admitting: Allergy & Immunology

## 2020-03-22 VITALS — BP 154/82 | HR 69 | Temp 97.8°F | Wt 142.0 lb

## 2020-03-22 VITALS — BP 130/82 | HR 73 | Temp 97.9°F | Resp 16

## 2020-03-22 DIAGNOSIS — J3089 Other allergic rhinitis: Secondary | ICD-10-CM

## 2020-03-22 DIAGNOSIS — T380X5A Adverse effect of glucocorticoids and synthetic analogues, initial encounter: Secondary | ICD-10-CM | POA: Diagnosis not present

## 2020-03-22 DIAGNOSIS — J302 Other seasonal allergic rhinitis: Secondary | ICD-10-CM

## 2020-03-22 DIAGNOSIS — R768 Other specified abnormal immunological findings in serum: Secondary | ICD-10-CM

## 2020-03-22 DIAGNOSIS — L501 Idiopathic urticaria: Secondary | ICD-10-CM

## 2020-03-22 DIAGNOSIS — R946 Abnormal results of thyroid function studies: Secondary | ICD-10-CM | POA: Diagnosis not present

## 2020-03-22 MED ORDER — XOLAIR 150 MG ~~LOC~~ SOLR: 300.0000 mg | SUBCUTANEOUS | 11 refills | Status: DC

## 2020-03-22 NOTE — Progress Notes (Signed)
FOLLOW UP  Date of Service/Encounter:  03/22/20   Assessment:   Chronic idiopathic urticaria-improved onXolair 300mg  monthly but now with breakthrough episodes (changing to every two weeks)  Abnormality of tongue- with normal ENT evaluation  Multiple food sensitizations (egg, milk, wheat, peanut) - with clinical tolerance  Elevated ANA-evaluated and cleared byRheumatology  Enlarged left lobe of thyroid - in the setting of hypothyroidism and positive thyroid antibodies   [name] presents for yet another sick visit for her urticaria.  Over the weekend, she had outbreaks despite the extra [place].  She contacted our on-call physician who eventually recommended going to urgent care for steroid injection.  She did go to urgent care and was placed on prednisone.  She is doing 20 mg twice daily for now and is planning on decreasing to 10 mg twice daily thereafter.  We are going to keep her on the 10 mg twice daily until she gets her next Xolair on Friday, September 17.  After that, we will wean to 1 tablet daily for 1 week and then stop.  I still am optimistic that we can get her off of the prednisone.   Plan/Recommendations:   1. Chronic urticaria with recurrent anaphylaxis episodes - Continue with prednisone: two tablets twice daily through Thursday morning, then one tablet twice daily until your next Xolair (Friday September 17th). - After your next Xolair, we will decrease to one tablet of prednisone daily for two weeks, and then STOP. - Continue with Allegra two tablets in the morning and two tablets at night.  - Continue with Pepcid 40mg  twice daily. - Continue with hydroxyzine 1-2 tablets at night  2. Follow up in three months.   Subjective:   [name] is a 74 y.o. female presenting today for follow up of  Chief Complaint  Patient presents with  . Urticaria    [name] has a history of the following: Patient Active Problem List   Diagnosis  Date Noted  . Chronic idiopathic urticaria 11/23/2019  . Acute urticaria 10/19/2019  . Elevated antinuclear antibody (ANA) level 10/19/2019  . 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 S/P cataract surgery 09/18/2018  . Vitiligo 04/07/2015  . Hypothyroid 05/29/2011  . Allergic rhinitis due to pollen 05/29/2011  . Osteopenia 05/29/2011    History obtained from: chart review and patient.  [name] is a 74 y.o. female presenting for a sick visit.  She was last seen last Thursday.  At that time, she had a breakout because she missed an evening dose of her antihistamines. We recommended taking an extra dose of Allegra at noon through the weekend to see if we can catch up on this.  She has had a previous extensive work of which has been remarkable only for some very low IgE's to some foods.  I never felt this was food mediated and she finally came to that realization as well.  Since last visit, she actually contacted our on-call physician due to breakouts over the weekend.  Dr. Maudie Mercury recommended Benadryl at first but then after another call recommended going to the emergency room or urgent care for a steroid injection.  [name] did do that and was started on a prednisone burst.  She is currently taking 20 mg twice daily and plans to do that through Thursday.  She is wondering what she should do about her prednisone after that.  She received her Xolair last Friday.  Her next Xolair if it is approved for  every 2 weeks would be on Friday, September 17.  I did talk to [facility] and she is in the process of getting that approved.  She has not reached out to Dr. Marcelline Deist because Dr. Marcelline Deist told her to follow-up as needed.  According to her, Dr. Marcelline Deist said that I had at all under control.  She did break-up with her boyfriend of 4 years on Friday.  She feels very happy about this. Her transmission when out on her way down here, but her mechanic thought she would probably be able to make it back to  [place].  She is going to the beach this weekend went her kids and grandkids.  She is going to [employer] for that.  Otherwise, there have been no changes to her past medical history, surgical history, family history, or social history.    Review of Systems  Constitutional: Negative.  Negative for chills, fever, malaise/fatigue and weight loss.  HENT: Negative for congestion, ear discharge, ear pain, sinus pain and sore throat.   Eyes: Negative for pain, discharge and redness.  Respiratory: Negative for cough, sputum production, shortness of breath and wheezing.   Cardiovascular: Negative.  Negative for chest pain and palpitations.  Gastrointestinal: Negative for abdominal pain, constipation, diarrhea, heartburn, nausea and vomiting.  Skin: Positive for itching and rash.  Neurological: Negative for dizziness and headaches.  Endo/Heme/Allergies: Negative for environmental allergies. Does not bruise/bleed easily.       Objective:   Blood pressure 130/82, pulse 73, temperature 97.9 F (36.6 C), temperature source Temporal, resp. rate 16, SpO2 93 %. There is no height or weight on file to calculate BMI.   Physical Exam:  Physical Exam Constitutional:      Appearance: She is well-developed.  HENT:     Head: Normocephalic and atraumatic.     Right Ear: Tympanic membrane, ear canal and external ear normal.     Left Ear: Tympanic membrane, ear canal and external ear normal.     Nose: No nasal deformity, septal deviation, mucosal edema or rhinorrhea.     Right Turbinates: Enlarged.     Left Turbinates: Enlarged.     Right Sinus: No maxillary sinus tenderness or frontal sinus tenderness.     Left Sinus: No maxillary sinus tenderness or frontal sinus tenderness.     Mouth/Throat:     Mouth: Mucous membranes are not pale and not dry.     Pharynx: Uvula midline.  Eyes:     General:        Right eye: No discharge.        Left eye: No discharge.     Conjunctiva/sclera:  Conjunctivae normal.     Right eye: Right conjunctiva is not injected. No chemosis.    Left eye: Left conjunctiva is not injected. No chemosis.    Pupils: Pupils are equal, round, and reactive to light.  Cardiovascular:     Rate and Rhythm: Normal rate and regular rhythm.     Heart sounds: Normal heart sounds.  Pulmonary:     Effort: Pulmonary effort is normal. No tachypnea, accessory muscle usage or respiratory distress.     Breath sounds: Normal breath sounds. No wheezing, rhonchi or rales.  Chest:     Chest wall: No tenderness.  Lymphadenopathy:     Cervical: No cervical adenopathy.  Skin:    Coloration: Skin is not pale.     Findings: No abrasion, erythema, petechiae or rash. Rash is not papular, urticarial or vesicular.     Comments:  She does not have any isolated urticaria at all today (but she is on prednisone).   Neurological:     Mental Status: She is alert.      Diagnostic studies: none       Salvatore Marvel, MD  [facility] [place] [facility] [place]

## 2020-03-22 NOTE — Telephone Encounter (Signed)
L/M for patient advising possible change to every 14 days.  I have sent [place] to [employer] regarding same but will not know until next reorder if they are approving dose

## 2020-03-22 NOTE — Patient Instructions (Signed)
1. Chronic urticaria with recurrent anaphylaxis episodes - Continue with prednisone: two tablets twice daily through Thursday morning, then one tablet twice daily until your next Xolair (Friday September 17th). - After your next Xolair, we will decrease to one tablet of prednisone daily for two weeks, and then STOP. - Continue with Allegra two tablets in the morning and two tablets at night.  - Continue with Pepcid 40mg  twice daily. - Continue with hydroxyzine 1-2 tablets at night  2. Follow up in three months.    Please inform us of any Emergency Department visits, hospitalizations, or changes in symptoms. Call us before going to the ED for breathing or allergy symptoms since we might be able to fit you in for a sick visit. Feel free to contact us anytime with any questions, problems, or concerns.  It was a pleasure to see you again today!  Websites that have reliable patient information: 1. American Academy of Asthma, Allergy, and Immunology: www.aaaai.org 2. Food Allergy Research and Education (FARE): foodallergy.org 3. Mothers of Asthmatics: http://www.asthmacommunitynetwork.org 4. American College of Allergy, Asthma, and Immunology: www.acaai.org   COVID-19 Vaccine Information can be found at: ShippingScam.co.uk For questions related to vaccine distribution or appointments, please email [email or web address] or call [phone].     "Like" [place] on Facebook and Instagram for our latest updates!        Make sure you are registered to vote! If you have moved or changed any of your contact information, you will need to get this updated before voting!  In some cases, you MAY be able to register to vote online: [email or web address]

## 2020-03-22 NOTE — Progress Notes (Signed)
   Subjective:    Patient ID: DEKISHA MESMER, female    DOB: [birth year], 74 y.o.   MRN: [id number]  HPI She is here for consult concerning 2 issues.  One is the thyroid scan it was recently done as well as continued difficulty with chronic idiopathic urticaria.  She was seen on September 2 by her allergist and an attempt will be made to give her Xolair twice per month.  In the meantime her urticaria got worse and she was seen at urgent care.  They gave her a steroid injection and she started taking her steroids again.  She is having difficulty with steroids causing her to become more irritable, having weight gain, nervous and sleep issues.   Review of Systems     Objective:   Physical Exam Alert and in no distress otherwise not examined       Assessment & Plan:  Chronic idiopathic urticaria  Abnormal thyroid exam I discussed further Covid vaccine with her as well as flu shot and I will defer administering these until we decide what will happen with the steroids.  Once that decision is made then we can decide when to get the rest of the Covid vaccine including the booster and a flu shot .she is to see the allergist later today. I discussed the thyroid scan and since she has been on thyroid for several decades, it makes the ultrasound much less of an issue.  She will continue on her thyroid

## 2020-03-23 ENCOUNTER — Encounter: Payer: Self-pay | Admitting: Allergy & Immunology

## 2020-03-23 NOTE — Progress Notes (Signed)
Schedule her at the end of the month for Covid vaccine

## 2020-03-24 ENCOUNTER — Telehealth: Payer: Self-pay

## 2020-03-24 NOTE — Telephone Encounter (Signed)
-----   Message from Denita Lung, MD sent at 03/23/2020 10:22 AM EDT -----   ----- Message ----- From: Valentina Shaggy, MD Sent: 03/23/2020   8:54 AM EDT To: Denita Lung, MD

## 2020-03-24 NOTE — Telephone Encounter (Signed)
Pt was called to schedule covid vaccine in a  month but no answer. Kh

## 2020-03-29 ENCOUNTER — Telehealth: Payer: Self-pay

## 2020-03-29 NOTE — Telephone Encounter (Signed)
Patient called stating her hair is thinning from all the medication she is taking. Patient is wondering if she can start taking Biotin 5,000 & doing womens ROGAINE treatment?  Please Advise Dr Ernst Bowler.

## 2020-03-30 NOTE — Telephone Encounter (Signed)
Yes that is fine to use those.  Cathy Marvel, MD [facility] [place] [facility] [place]

## 2020-03-30 NOTE — Telephone Encounter (Signed)
Patient stated she believes it is her prednisone is causing her hair to fall out and was suggested by her hairstylist to take Biotin and Womens Rogaine

## 2020-04-01 ENCOUNTER — Other Ambulatory Visit: Payer: Self-pay

## 2020-04-01 ENCOUNTER — Ambulatory Visit (INDEPENDENT_AMBULATORY_CARE_PROVIDER_SITE_OTHER): Payer: Medicare Other

## 2020-04-01 DIAGNOSIS — L501 Idiopathic urticaria: Secondary | ICD-10-CM | POA: Diagnosis not present

## 2020-04-14 ENCOUNTER — Telehealth: Payer: Self-pay | Admitting: Allergy & Immunology

## 2020-04-14 NOTE — Telephone Encounter (Signed)
Dr. Gallagher please advise.  

## 2020-04-14 NOTE — Telephone Encounter (Signed)
PT calling to confirm xolair appt and asking how she is going to proceed with her current medication regimen. Asking if she will be stopping prednisone.

## 2020-04-15 ENCOUNTER — Ambulatory Visit: Payer: Self-pay

## 2020-04-15 ENCOUNTER — Other Ambulatory Visit: Payer: Self-pay

## 2020-04-15 ENCOUNTER — Ambulatory Visit (INDEPENDENT_AMBULATORY_CARE_PROVIDER_SITE_OTHER): Payer: Medicare Other

## 2020-04-15 DIAGNOSIS — L501 Idiopathic urticaria: Secondary | ICD-10-CM

## 2020-04-15 MED ORDER — OMALIZUMAB 150 MG ~~LOC~~ SOLR
300.0000 mg | SUBCUTANEOUS | Status: DC
  Administered 2020-04-15 – 2021-02-15 (×22): 300 mg via SUBCUTANEOUS

## 2020-04-15 NOTE — Telephone Encounter (Signed)
PT calling back to speak to [place]. Was told ok to stop prednisone but what about allegra & pepcid? She's been taking both meds 2 in am and 2 in pm

## 2020-04-15 NOTE — Telephone Encounter (Signed)
Patient came in today to get her Xolair and asked what she needed to do. Patient stated that she has been taking one 10 mg tablet of prednisone every day for two weeks with no hives. Patient has now gone from [place] every 4 weeks to every 2 weeks and patient stated that is helping. She is wanting to know if she can stop the prednisone. I spoke with our NP Chrissie, she stated that if patient has been doing ok on one 10 mg tablet after changing from 4 weeks to 2 weeks with Xolair that she may stop the prednisone and see how she does. Left detailed message per patient and informed her she may stop the prednisone if she would like however if she has any problems in between Xolair injections to contact the office.

## 2020-04-18 NOTE — Telephone Encounter (Signed)
Please advise 

## 2020-04-18 NOTE — Telephone Encounter (Signed)
Called and spoke with the patient and she stated that her hives are doing well so far without the prednisone. She is wondering if she should continue taking the allegra and pepcid in the morning and at night. Please advise.

## 2020-04-18 NOTE — Telephone Encounter (Signed)
Please call and see how her hives are doing since stopping the prednisone on Friday.   Thank you!

## 2020-04-19 ENCOUNTER — Other Ambulatory Visit (HOSPITAL_COMMUNITY)
Admission: RE | Admit: 2020-04-19 | Discharge: 2020-04-19 | Disposition: A | Discharge status: Home / Self Care | Payer: Medicare Other | Source: Ambulatory Visit | Attending: Pediatrics | Admitting: Pediatrics

## 2020-04-19 NOTE — Telephone Encounter (Signed)
I would continue with the BID dosing of the Allegra and Pepcid for one more week and then try to wean down to once daily after that.   Salvatore Marvel, MD [facility] and [place] [facility] [place]

## 2020-04-19 NOTE — Telephone Encounter (Signed)
Spoke with patient, informed her of Dr. Gillermina Hu recommendation. Patient verbalized understanding and after getting her next dose of Xolair in one week she will wean down to once and day. Patient will let us know how she does.

## 2020-04-29 ENCOUNTER — Ambulatory Visit (INDEPENDENT_AMBULATORY_CARE_PROVIDER_SITE_OTHER): Payer: Medicare Other

## 2020-04-29 DIAGNOSIS — L501 Idiopathic urticaria: Secondary | ICD-10-CM | POA: Diagnosis not present

## 2020-05-10 NOTE — Telephone Encounter (Signed)
Patient states she has hives in two places. This is the first time she has gotten hives since being on Xolair, and would like to know if her Xolair was decreased to make her have the hives. Patient increased allegra and pepcid to 2 in the morning and 2 at night. This seemed to help the hives but she still has one place on her right thigh. Patient would like to know what to do.  Please advise.

## 2020-05-10 NOTE — Telephone Encounter (Signed)
Patient called to follow up on her telephone message from this morning.

## 2020-05-10 NOTE — Telephone Encounter (Signed)
Please advise. Thank you

## 2020-05-11 NOTE — Telephone Encounter (Signed)
Two days ago on Monday, she reports that she developed hives on her thigh and upper shoulder. She did increase her Allegra to two twice daily in conjunction with Pepcid two twice daily. She sthen had some on the shoulder and her right side. Now she is fine. She remains on the Allegra two BID for both.   She does feel a bit itchy, but maybe she has not taken the higher dosing for longer. I recommended that she continue with her Xolair which she is scheduled to get this Friday.   I recommended decreasing her Pepcid to one tablet twice daily. She is going to continue on the Allegra two tablets twice daily.   She is interested in getting her second [place] vaccination. I will get her scheduled for November 19th at 8:30am. She is going to get her high dose flu shot at her PCP's office.   Salvatore Marvel, MD [facility] [place] [facility] [place]

## 2020-05-13 ENCOUNTER — Other Ambulatory Visit: Payer: Self-pay

## 2020-05-13 ENCOUNTER — Ambulatory Visit (INDEPENDENT_AMBULATORY_CARE_PROVIDER_SITE_OTHER): Payer: Medicare Other

## 2020-05-13 DIAGNOSIS — L501 Idiopathic urticaria: Secondary | ICD-10-CM

## 2020-05-27 ENCOUNTER — Other Ambulatory Visit: Payer: Self-pay

## 2020-05-27 ENCOUNTER — Ambulatory Visit (INDEPENDENT_AMBULATORY_CARE_PROVIDER_SITE_OTHER): Payer: Medicare Other

## 2020-05-27 DIAGNOSIS — L501 Idiopathic urticaria: Secondary | ICD-10-CM

## 2020-06-01 ENCOUNTER — Other Ambulatory Visit: Payer: Self-pay

## 2020-06-03 ENCOUNTER — Ambulatory Visit: Payer: Medicare Other

## 2020-06-08 ENCOUNTER — Other Ambulatory Visit: Payer: Self-pay

## 2020-06-08 ENCOUNTER — Ambulatory Visit (INDEPENDENT_AMBULATORY_CARE_PROVIDER_SITE_OTHER): Payer: Medicare Other

## 2020-06-08 DIAGNOSIS — L501 Idiopathic urticaria: Secondary | ICD-10-CM | POA: Diagnosis not present

## 2020-06-15 ENCOUNTER — Encounter: Payer: Self-pay | Admitting: Allergy & Immunology

## 2020-06-15 ENCOUNTER — Other Ambulatory Visit: Payer: Self-pay

## 2020-06-15 ENCOUNTER — Ambulatory Visit (INDEPENDENT_AMBULATORY_CARE_PROVIDER_SITE_OTHER): Payer: Medicare Other | Admitting: Allergy & Immunology

## 2020-06-15 VITALS — BP 138/76 | HR 70 | Resp 18

## 2020-06-15 DIAGNOSIS — L501 Idiopathic urticaria: Secondary | ICD-10-CM

## 2020-06-15 DIAGNOSIS — T50Z95D Adverse effect of other vaccines and biological substances, subsequent encounter: Secondary | ICD-10-CM | POA: Diagnosis not present

## 2020-06-15 NOTE — Patient Instructions (Addendum)
1. Chronic urticaria with recurrent anaphylaxis episodes - Continue with Allegra two tablets in the morning and two tablets at night.  - Continue with Pepcid 40mg  twice daily. - Continue with Xolair every two weeks. - We will plan to do COVID19 vaccine component testing in the new year.   2. Return in about 2 months (around 08/16/2020).    Please inform us of any Emergency Department visits, hospitalizations, or changes in symptoms. Call us before going to the ED for breathing or allergy symptoms since we might be able to fit you in for a sick visit. Feel free to contact us anytime with any questions, problems, or concerns.  It was a pleasure to see you again today!  Websites that have reliable patient information: 1. American Academy of Asthma, Allergy, and Immunology: www.aaaai.org 2. Food Allergy Research and Education (FARE): foodallergy.org 3. Mothers of Asthmatics: http://www.asthmacommunitynetwork.org 4. American College of Allergy, Asthma, and Immunology: www.acaai.org   COVID-19 Vaccine Information can be found at: ShippingScam.co.uk For questions related to vaccine distribution or appointments, please email [email or web address] or call [phone].     "Like" [place] on Facebook and Instagram for our latest updates!     HAPPY FALL!     Make sure you are registered to vote! If you have moved or changed any of your contact information, you will need to get this updated before voting!  In some cases, you MAY be able to register to vote online: [email or web address]

## 2020-06-16 ENCOUNTER — Encounter: Payer: Self-pay | Admitting: Allergy & Immunology

## 2020-06-16 NOTE — Progress Notes (Signed)
FOLLOW UP  Date of Service/Encounter:  06/16/20   Assessment:   Chronic idiopathic urticaria-improved onXolair 300mg  monthlybut now with breakthrough episodes (changing to every two weeks)  Abnormality of tongue- with normal ENT evaluation  Multiple food sensitizations (egg, milk, wheat, peanut) - with clinical tolerance  Elevated ANA-evaluated and cleared byRheumatology  Enlarged left lobe of thyroid -in the setting of hypothyroidism and positive thyroid antibodies  Plan/Recommendations:   1. Chronic urticaria with recurrent anaphylaxis episodes - Continue with Allegra two tablets in the morning and two tablets at night.  - Continue with Pepcid 40mg  twice daily. - Continue with Xolair every two weeks. - We will plan to do COVID19 vaccine component testing in the new year.   2. Return in about 2 months (around 08/16/2020).   Subjective:   [name] is a 74 y.o. female presenting today for follow up of  Chief Complaint  Patient presents with  . Urticaria    doing much better, especially with meds the way they are.     [name] has a history of the following: Patient Active Problem List   Diagnosis Date Noted  . Chronic idiopathic urticaria 11/23/2019  . Acute urticaria 10/19/2019  . Elevated antinuclear antibody (ANA) level 10/19/2019  . 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 S/P cataract surgery 09/18/2018  . Vitiligo 04/07/2015  . Hypothyroid 05/29/2011  . Allergic rhinitis due to pollen 05/29/2011  . Osteopenia 05/29/2011    History obtained from: chart review and patient.  [name] is a 74 y.o. female presenting for a follow up visit. She was last seen in the office in September 2021. At that time, she had a recent urticarial outbreak. We continued her on prednisone for another few days. She had recently been to the emergency room for an urticarial outbreak and had been started on prednisone.  We continued her on Allegra 2  tablets twice daily as well as Pepcid 40 mg twice daily.  We did change her to Xolair every 2 weeks since she had failed monthly.  Since last visit, she has done very well.  She has been on Xolair every 14 days which has been controlling her hives.  She has not really tried messing with her Allegra dosing and remains on 2 tablets twice daily as well as Pepcid twice daily.  She has not needed prednisone at all since I saw her in September.  She feels like a new person without the prednisone and she says that her friends of stop complaining about her.  She had fairly significant "roid rage" from her chronic prednisone.  She does not want to make any medication changes at this point in time.  We did discuss possibly spacing out her Xolair to every 3 weeks, but she wants to wait on that.  She has only received 1 dose of the COVID-19 vaccine.  She received Moderna.  Shortly after the [facility], she started having these hives and she has continued to connect to the vaccination with the emergence of the hives.  I have tried to convince her that they are likely unrelated, but she has not been willing to receive the second vaccine.  She is open to receiving it in our clinic, but she would like to wait until after the holidays.   She has never reacted to the vaccine in the past, but she would like to undergo the component testing to make her feel more at ease with receiving it.  Otherwise,  there have been no changes to her past medical history, surgical history, family history, or social history.    Review of Systems  Constitutional: Negative.  Negative for chills, fever, malaise/fatigue and weight loss.  HENT: Negative.  Negative for congestion, ear discharge, ear pain, sinus pain and sore throat.   Eyes: Negative for pain, discharge and redness.  Respiratory: Negative for cough, sputum production, shortness of breath and wheezing.   Cardiovascular: Negative.  Negative for chest pain and palpitations.    Gastrointestinal: Negative for constipation, diarrhea, heartburn, nausea and vomiting.  Skin: Negative.  Negative for itching and rash.  Neurological: Negative for dizziness and headaches.  Endo/Heme/Allergies: Negative for environmental allergies. Does not bruise/bleed easily.       Objective:   Blood pressure 138/76, pulse 70, resp. rate 18, SpO2 97 %. There is no height or weight on file to calculate BMI.   Physical Exam:  Physical Exam Constitutional:      Appearance: She is well-developed.     Comments: Very pleasant female.  HENT:     Head: Normocephalic and atraumatic.     Right Ear: Tympanic membrane, ear canal and external ear normal.     Left Ear: Tympanic membrane, ear canal and external ear normal.     Nose: No nasal deformity, septal deviation, mucosal edema or rhinorrhea.     Right Turbinates: Enlarged. Not swollen or pale.     Left Turbinates: Enlarged. Not swollen or pale.     Right Sinus: No maxillary sinus tenderness or frontal sinus tenderness.     Left Sinus: No maxillary sinus tenderness or frontal sinus tenderness.     Comments: No polyps.    Mouth/Throat:     Mouth: Mucous membranes are not pale and not dry.     Pharynx: Uvula midline.  Eyes:     General:        Right eye: No discharge.        Left eye: No discharge.     Conjunctiva/sclera: Conjunctivae normal.     Right eye: Right conjunctiva is not injected. No chemosis.    Left eye: Left conjunctiva is not injected. No chemosis.    Pupils: Pupils are equal, round, and reactive to light.  Cardiovascular:     Rate and Rhythm: Normal rate and regular rhythm.     Heart sounds: Normal heart sounds.  Pulmonary:     Effort: Pulmonary effort is normal. No tachypnea, accessory muscle usage or respiratory distress.     Breath sounds: Normal breath sounds. No wheezing, rhonchi or rales.  Chest:     Chest wall: No tenderness.  Lymphadenopathy:     Cervical: No cervical adenopathy.  Skin:     Coloration: Skin is not pale.     Findings: No abrasion, erythema, petechiae or rash. Rash is not papular, urticarial or vesicular.     Comments: No urticarial lesions appreciated.  Neurological:     Mental Status: She is alert.  Psychiatric:        Behavior: Behavior is cooperative.      Diagnostic studies: none     Salvatore Marvel, MD  [facility] [place] [place]

## 2020-06-24 ENCOUNTER — Ambulatory Visit (INDEPENDENT_AMBULATORY_CARE_PROVIDER_SITE_OTHER): Payer: Medicare Other

## 2020-06-24 ENCOUNTER — Other Ambulatory Visit: Payer: Self-pay

## 2020-06-24 DIAGNOSIS — L501 Idiopathic urticaria: Secondary | ICD-10-CM | POA: Diagnosis not present

## 2020-07-06 ENCOUNTER — Ambulatory Visit (INDEPENDENT_AMBULATORY_CARE_PROVIDER_SITE_OTHER): Payer: Medicare Other

## 2020-07-06 ENCOUNTER — Other Ambulatory Visit: Payer: Self-pay

## 2020-07-06 DIAGNOSIS — L501 Idiopathic urticaria: Secondary | ICD-10-CM

## 2020-07-19 ENCOUNTER — Telehealth: Payer: Self-pay

## 2020-07-19 NOTE — Telephone Encounter (Signed)
Spoke with patient, informed her of Dr. Ellouise Newer recommendation. Patient did informed that her lips are chapped and she was messing with them yesterday and pulling the skin off with her teeth, she stated she doesn't know if that may have caused it. She stated she was going to wait on taking extra Allegra unless it happened again.

## 2020-07-19 NOTE — Telephone Encounter (Signed)
Patient called to report some lip swelling that began this morning. She does not report any other symptoms at this time. She did request to come in tomorrow for her Xolair. I rescheduled her appointment to 07-20-20 at 1100 and patient is aware to call ahead for mixing.

## 2020-07-19 NOTE — Telephone Encounter (Signed)
Noted. Sounds like a plan. She can take extra Allegra if needed in the interim.   Malachi Bonds, MD [facility]

## 2020-07-20 ENCOUNTER — Other Ambulatory Visit: Payer: Self-pay

## 2020-07-20 ENCOUNTER — Ambulatory Visit (INDEPENDENT_AMBULATORY_CARE_PROVIDER_SITE_OTHER): Payer: Medicare Other

## 2020-07-20 DIAGNOSIS — L501 Idiopathic urticaria: Secondary | ICD-10-CM

## 2020-07-22 ENCOUNTER — Ambulatory Visit: Payer: Self-pay

## 2020-07-28 ENCOUNTER — Institutional Professional Consult (permissible substitution): Payer: Medicare Other | Admitting: Family Medicine

## 2020-08-02 ENCOUNTER — Institutional Professional Consult (permissible substitution): Payer: Medicare Other | Admitting: Family Medicine

## 2020-08-03 ENCOUNTER — Other Ambulatory Visit: Payer: Self-pay

## 2020-08-03 ENCOUNTER — Ambulatory Visit (INDEPENDENT_AMBULATORY_CARE_PROVIDER_SITE_OTHER): Payer: Medicare Other

## 2020-08-03 DIAGNOSIS — L501 Idiopathic urticaria: Secondary | ICD-10-CM | POA: Diagnosis not present

## 2020-08-05 ENCOUNTER — Encounter: Payer: Medicare Other | Admitting: Allergy & Immunology

## 2020-08-12 ENCOUNTER — Institutional Professional Consult (permissible substitution): Payer: Medicare Other | Admitting: Family Medicine

## 2020-08-17 ENCOUNTER — Other Ambulatory Visit: Payer: Self-pay

## 2020-08-17 ENCOUNTER — Ambulatory Visit (INDEPENDENT_AMBULATORY_CARE_PROVIDER_SITE_OTHER): Payer: Medicare Other

## 2020-08-17 DIAGNOSIS — J4540 Moderate persistent asthma, uncomplicated: Secondary | ICD-10-CM

## 2020-08-17 DIAGNOSIS — J309 Allergic rhinitis, unspecified: Secondary | ICD-10-CM

## 2020-08-31 ENCOUNTER — Ambulatory Visit (INDEPENDENT_AMBULATORY_CARE_PROVIDER_SITE_OTHER): Payer: Medicare Other

## 2020-08-31 ENCOUNTER — Other Ambulatory Visit: Payer: Self-pay

## 2020-08-31 DIAGNOSIS — L501 Idiopathic urticaria: Secondary | ICD-10-CM

## 2020-09-07 ENCOUNTER — Encounter: Payer: Self-pay | Admitting: Family

## 2020-09-07 ENCOUNTER — Other Ambulatory Visit: Payer: Self-pay

## 2020-09-07 ENCOUNTER — Ambulatory Visit (INDEPENDENT_AMBULATORY_CARE_PROVIDER_SITE_OTHER): Payer: Medicare Other | Admitting: Family

## 2020-09-07 VITALS — BP 110/68 | HR 87 | Temp 97.8°F | Resp 17 | Ht 62.01 in | Wt 141.0 lb

## 2020-09-07 DIAGNOSIS — T50995D Adverse effect of other drugs, medicaments and biological substances, subsequent encounter: Secondary | ICD-10-CM | POA: Diagnosis not present

## 2020-09-07 DIAGNOSIS — Z887 Allergy status to serum and vaccine status: Secondary | ICD-10-CM

## 2020-09-07 DIAGNOSIS — T50Z95D Adverse effect of other vaccines and biological substances, subsequent encounter: Secondary | ICD-10-CM

## 2020-09-07 DIAGNOSIS — Z91013 Allergy to seafood: Secondary | ICD-10-CM

## 2020-09-07 DIAGNOSIS — L500 Allergic urticaria: Secondary | ICD-10-CM | POA: Diagnosis not present

## 2020-09-07 MED ORDER — EPINEPHRINE 0.3 MG/0.3ML IJ SOAJ: 0.3000 mg | INTRAMUSCULAR | 1 refills | Status: DC | PRN

## 2020-09-07 NOTE — Patient Instructions (Addendum)
COVID vaccine component testing - Daren was able to tolerate the COVID vaccine component testing challenge today at the office without adverse signs or symptoms of an allergic reaction.  - Today's skin prick testing was negative to Miralax (source of PEG 3350), methylprednisolone acetate (source of PEG 3350), and triamcinolone acetonide (source of polysorbate-80).   Her risk of having a reaction to the second dose of Moderna or Pfizer vaccine is unknown but given her clinical history and today's results her risk would be low. - Therefore, she has the same risk of systemic reaction associated with receiving the COVID vaccine as the general population.  - Monitor for allergic symptoms such as rash, wheezing, diarrhea, swelling, and vomiting for the next 24 hours. If severe symptoms occur, call 911. For less severe symptoms treat with Benadryl 25-50 mg every 6 hours and call the clinic.  Should you choose to receive a COVID vaccine: Have someone drive you to the testing site, get the vaccine at a site where they are prepared to treat an allergic reaction, and stay for the full 30 minutes after receiving the vaccine    Call the clinic if this treatment plan is not working well for you  We will give you some prednisone to help with the hives.  Start prednisone 10 mg taking 1 tablet twice a day for 4 days, and on the fifth day take 1 tablet and stop.    Schedule a follow up appointment in 2 months or sooner if needed

## 2020-09-07 NOTE — Progress Notes (Signed)
[address], [address] [place]  [address] Dept: [phone]  FOLLOW UP NOTE  Patient ID: [name], female    DOB: 07/22/1945  Age: 75 y.o. MRN: [id number] Date of Office Visit: 09/07/2020  Assessment  Chief Complaint: Food/Drug Challenge  HPI [name] is a 75 year old female who presents today for COVID component testing.  She reports last March she received her Moderna COVID vaccine and shortly after began having  hives.  She has never reacted to a vaccine in the past.  She reports that she has stopped both Allegra and Pepcid on Sunday.  Last night she noticed that she did start to get some hives on her buttocks and waistline.  She is not having any hives right now.  She denies any cardiorespiratory, gastrointestinal, and cutaneous symptoms.  As the nurse was cleaning her arm to start the skin testing she began to complain of tingling of her lower lip.  She reports that she had this sensation prior when her hives were flaring.  No lower lip edema noted on exam.  She was given prednisone 20 mg and at 9:56 AM she reported that she no longer had tingling/lip swelling sensation. She denied any other cardiorespiratory and gastrointestinal symptoms.   Drug Allergies:  Allergies  Allergen Reactions  . Levofloxacin   . Other Anaphylaxis  . Shellfish Allergy     Review of Systems: Review of Systems  Constitutional: Negative for chills and fever.  HENT:       Denies rhinorrhea, nasal congestion, and post nasal drip  Eyes:       Denies itchy watery eyes  Respiratory: Negative for cough, shortness of breath and wheezing.   Cardiovascular: Negative for chest pain and palpitations.  Gastrointestinal: Negative for abdominal pain, diarrhea, nausea and vomiting.  Genitourinary: Negative for dysuria.  Skin: Negative for itching and rash.       Reports that she had hives last night on her buttocks and waist line, but reports none today  Neurological: Negative for  headaches.    Physical Exam: BP 110/68 (BP Location: Left Arm, Patient Position: Sitting, Cuff Size: Normal)   Pulse 87   Temp 97.8 F (36.6 C) (Temporal)   Resp 17   Ht 5' 2.01" (1.575 m)   Wt 141 lb (64 kg)   SpO2 96%   BMI 25.78 kg/m    Physical Exam Constitutional:      Appearance: Normal appearance.  HENT:     Head: Normocephalic and atraumatic.     Comments: Pharynx normal. Eyes normal. Ears normal. Nose normal    Right Ear: Tympanic membrane, ear canal and external ear normal.     Left Ear: Tympanic membrane, ear canal and external ear normal.     Nose: Nose normal.     Mouth/Throat:     Mouth: Mucous membranes are moist.     Pharynx: Oropharynx is clear.  Eyes:     Conjunctiva/sclera: Conjunctivae normal.  Cardiovascular:     Rate and Rhythm: Regular rhythm.     Heart sounds: Normal heart sounds.  Pulmonary:     Effort: Pulmonary effort is normal.     Breath sounds: Normal breath sounds.     Comments: Lungs clear to auscultation Musculoskeletal:     Cervical back: Neck supple.  Skin:    General: Skin is warm.     Comments: No urticarial lesions or rash noted  Neurological:     Mental Status: She is alert and oriented to person,  place, and time.  Psychiatric:        Mood and Affect: Mood normal.        Behavior: Behavior normal.        Thought Content: Thought content normal.        Judgment: Judgment normal.     Diagnostics:  Allergy testing: Covid vaccine component testing   Skin prick testing- Histamine (1.8 mg/mL) puncture: 2+  Control (negative - HSA) puncture: negative  Triamcinolone puncture: negative  Methylprednisolone puncture: negative  Miralax puncture (1:100 or 1.7 mg/mL): negative  Miralax puncture (1:10 or 17 mg/mL): negative  Miralax puncture (1:1 or 170 mg/mL): negative    Intradermal testing- Control (negative - HSA) intradermal: negative  Triamcinolone (1:100): negative  Methyprednisolone (1:100): negative  Triamcinolone  (1:10): negative  Methylprednisolone (1:10): negative  Triamcinolone (1:1): negative      Allergy testing results were read and interpreted by provider, documented by clinical staff.     Graded oral challenge to MiraLAX 170mg /ml.  Benefits and risks of challenge discussed and verbal consent obtained.    She was provided with doses equivalent to 1%, 10% and 90% for total amount of 30.70ml.   Doses provided every 15 minutes. She was observed for additional hour after completion of ingestion challenge.  She began to develop hives in her groin region and buttocks with 15 minutes left.  She denied any concomitant cardiorespiratory and gastrointestinal symptoms. Vitals were obtained prior to discharge and remained stable.     Assessment and Plan: 1. Vaccines and biological substances causing adverse effect in therapeutic use, subsequent encounter   2. Shellfish allergy     Meds ordered this encounter  Medications  . EPINEPHrine 0.3 mg/0.3 mL IJ SOAJ injection    Sig: Inject 0.3 mg into the muscle as needed for anaphylaxis.    Dispense:  2 each    Refill:  1    Patient Instructions  COVID vaccine component testing - [name] was able to tolerate the COVID vaccine component testing challenge today at the office without adverse signs or symptoms of an allergic reaction.  - Today's skin prick testing was negative to Miralax (source of PEG 3350), methylprednisolone acetate (source of PEG 3350), and triamcinolone acetonide (source of polysorbate-80).   Her risk of having a reaction to the second dose of Moderna or Pfizer vaccine is unknown but given her clinical history and today's results her risk would be low. - Therefore, she has the same risk of systemic reaction associated with receiving the COVID vaccine as the general population.  - Monitor for allergic symptoms such as rash, wheezing, diarrhea, swelling, and vomiting for the next 24 hours. If severe symptoms occur, call 911. For less severe  symptoms treat with Benadryl 25-50 mg every 6 hours and call the clinic.  Should you choose to receive a COVID vaccine: Have someone drive you to the testing site, get the vaccine at a site where they are prepared to treat an allergic reaction, and stay for the full 30 minutes after receiving the vaccine    Call the clinic if this treatment plan is not working well for you  We will give you some prednisone to help with the hives.  Start prednisone 10 mg taking 1 tablet twice a day for 4 days, and on the fifth day take 1 tablet and stop.    Schedule a follow up appointment in 2 months or sooner if needed    Return in about 2 months (around 11/05/2020), or if  symptoms worsen or fail to improve.    Thank you for the opportunity to care for this patient.  Please do not hesitate to contact me with questions.  [name], FNP [facility]

## 2020-09-14 ENCOUNTER — Other Ambulatory Visit: Payer: Self-pay

## 2020-09-14 ENCOUNTER — Ambulatory Visit (INDEPENDENT_AMBULATORY_CARE_PROVIDER_SITE_OTHER): Payer: Medicare Other

## 2020-09-14 DIAGNOSIS — L501 Idiopathic urticaria: Secondary | ICD-10-CM | POA: Diagnosis not present

## 2020-09-21 ENCOUNTER — Ambulatory Visit: Payer: Medicare Other | Admitting: Family Medicine

## 2020-09-26 ENCOUNTER — Other Ambulatory Visit: Payer: Self-pay | Admitting: Family Medicine

## 2020-09-26 DIAGNOSIS — Z1231 Encounter for screening mammogram for malignant neoplasm of breast: Secondary | ICD-10-CM

## 2020-09-29 ENCOUNTER — Other Ambulatory Visit: Payer: Self-pay | Admitting: Family Medicine

## 2020-09-29 DIAGNOSIS — E038 Other specified hypothyroidism: Secondary | ICD-10-CM

## 2020-09-30 ENCOUNTER — Other Ambulatory Visit: Payer: Self-pay

## 2020-09-30 ENCOUNTER — Ambulatory Visit (INDEPENDENT_AMBULATORY_CARE_PROVIDER_SITE_OTHER): Payer: Medicare Other

## 2020-09-30 DIAGNOSIS — L501 Idiopathic urticaria: Secondary | ICD-10-CM

## 2020-10-06 ENCOUNTER — Other Ambulatory Visit (INDEPENDENT_AMBULATORY_CARE_PROVIDER_SITE_OTHER): Payer: Medicare Other

## 2020-10-06 ENCOUNTER — Other Ambulatory Visit: Payer: Self-pay

## 2020-10-06 DIAGNOSIS — Z23 Encounter for immunization: Secondary | ICD-10-CM

## 2020-10-14 ENCOUNTER — Encounter: Payer: Self-pay | Admitting: Family Medicine

## 2020-10-14 ENCOUNTER — Other Ambulatory Visit: Payer: Self-pay

## 2020-10-14 ENCOUNTER — Ambulatory Visit (INDEPENDENT_AMBULATORY_CARE_PROVIDER_SITE_OTHER): Payer: Medicare Other

## 2020-10-14 ENCOUNTER — Ambulatory Visit (INDEPENDENT_AMBULATORY_CARE_PROVIDER_SITE_OTHER): Payer: Medicare Other | Admitting: Family Medicine

## 2020-10-14 VITALS — BP 130/74 | HR 64 | Temp 97.4°F | Ht 62.0 in | Wt 141.6 lb

## 2020-10-14 DIAGNOSIS — E038 Other specified hypothyroidism: Secondary | ICD-10-CM | POA: Diagnosis not present

## 2020-10-14 DIAGNOSIS — Z9849 Cataract extraction status, unspecified eye: Secondary | ICD-10-CM

## 2020-10-14 DIAGNOSIS — L508 Other urticaria: Secondary | ICD-10-CM | POA: Diagnosis not present

## 2020-10-14 DIAGNOSIS — J301 Allergic rhinitis due to pollen: Secondary | ICD-10-CM | POA: Diagnosis not present

## 2020-10-14 DIAGNOSIS — L501 Idiopathic urticaria: Secondary | ICD-10-CM | POA: Diagnosis not present

## 2020-10-14 DIAGNOSIS — T7800XA Anaphylactic reaction due to unspecified food, initial encounter: Secondary | ICD-10-CM

## 2020-10-14 DIAGNOSIS — I700 Atherosclerosis of aorta: Secondary | ICD-10-CM | POA: Diagnosis not present

## 2020-10-14 DIAGNOSIS — M8580 Other specified disorders of bone density and structure, unspecified site: Secondary | ICD-10-CM

## 2020-10-14 DIAGNOSIS — Z0000 Encounter for general adult medical examination without abnormal findings: Secondary | ICD-10-CM

## 2020-10-14 DIAGNOSIS — N3941 Urge incontinence: Secondary | ICD-10-CM

## 2020-10-14 MED ORDER — ATORVASTATIN CALCIUM 20 MG PO TABS: 20.0000 mg | ORAL_TABLET | Freq: Every day | ORAL | 3 refills | Status: DC

## 2020-10-14 MED ORDER — LEVOTHYROXINE SODIUM 100 MCG PO TABS: ORAL_TABLET | ORAL | 3 refills | Status: DC

## 2020-10-14 NOTE — Progress Notes (Signed)
[name] is a 75 y.o. female who presents for annual wellness visit ,CPEand follow-up on chronic medical conditions.  She continues to be followed by allergy and immunology for treatment of her chronic urticaria.  She is on a good regimen for that.  Mainly using Xolair and getting some good results with that as well as Allegra.  Did have recent x-ray evidence of atherosclerosis.  She continues on her thyroid medication and is having no difficulty with that.  She does complain of some early urge incontinence but she states that she is not ready to pursue this any further.  She is taking a good multivitamin and extra vitamin D and calcium.  She has had cataract surgery and seems to be doing well from that.  Otherwise she has no particular concerns or complaints.  Immunizations and Health Maintenance Immunization History  Administered Date(s) Administered  . DTaP 02/13/2002  . Fluad Quad(high Dose 65+) 03/12/2019, 10/06/2020  . Influenza Split 05/29/2011, 05/12/2012, 04/28/2013  . Influenza, High Dose Seasonal PF 05/12/2014, 04/07/2015, 05/17/2016, 04/11/2017, 04/17/2018  . Moderna Sars-Covid-2 Vaccination 09/22/2019  . Pneumococcal Conjugate-13 12/15/2013  . Pneumococcal Polysaccharide-23 02/13/2002  . Zoster 06/30/2007  . Zoster Recombinat (Shingrix) 05/01/2018, 09/04/2018   Health Maintenance Due  Topic Date Due  . TETANUS/TDAP  Never done  . COVID-19 Vaccine (2 - Moderna 3-dose series) 10/20/2019    Last Pap smear: aged out  Last mammogram: 11/03/19 Last colonoscopy: 04/11/18 Last DEXA: 11/03/19 Dentist: Q six months  Ophtho: Q year Exercise: working out at home   Other doctors caring for patient include: Dr. Ernst [facility]  Advanced directives: Does Patient Have a Medical Advance Directive?: Yes Type of Advance Directive: Living will Does patient want to make changes to medical advance directive?: No - Patient declined  Depression screen:  See questionnaire below.   Depression screen [facility] 2/9 10/14/2020 09/21/2019 09/18/2018 05/17/2016  Decreased Interest 0 0 0 0  Down, Depressed, Hopeless 0 0 0 0  PHQ - 2 Score 0 0 0 0    Fall Risk Screen: see questionnaire below. Fall Risk  10/14/2020 09/21/2019 09/18/2018 05/17/2016  Falls in the past year? 0 0 0 No  Number falls in past yr: 0 - - -  Injury with Fall? 0 - - -  Risk for fall due to : No Fall Risks - - -  Follow up Falls evaluation completed - - -    ADL screen:  See questionnaire below Functional Status Survey: Is the patient deaf or have difficulty hearing?: No Does the patient have difficulty seeing, even when wearing glasses/contacts?: No Does the patient have difficulty concentrating, remembering, or making decisions?: No Does the patient have difficulty walking or climbing stairs?: No Does the patient have difficulty dressing or bathing?: No Does the patient have difficulty doing errands alone such as visiting a doctor's office or shopping?: No   Review of Systems Constitutional: -, -unexpected weight change, -anorexia, -fatigue Allergy: -sneezing, -itching, -congestion Dermatology: denies changing moles, rash, lumps ENT: -runny nose, -ear pain, -sore throat,  Cardiology:  -chest pain, -palpitations, -orthopnea, Respiratory: -cough, -shortness of breath, -dyspnea on exertion, -wheezing,  Gastroenterology: -abdominal pain, -nausea, -vomiting, -diarrhea, -constipation, -dysphagia Hematology: -bleeding or bruising problems Musculoskeletal: -arthralgias, -myalgias, -joint swelling, -back pain, - Ophthalmology: -vision changes,  Urology: -dysuria, -difficulty urinating,  -urinary frequency, -urgency, incontinence Neurology: -, -numbness, , -memory loss, -falls, -dizziness    PHYSICAL EXAM:   General Appearance: Alert, cooperative, no distress, appears stated age Head: Normocephalic, without  obvious abnormality, atraumatic Eyes: PERRL, conjunctiva/corneas clear, EOM's intact,Nose: Nares normal,  mucosa normal, no drainage or sinus tenderness Throat: Lips, mucosa, and tongue normal; teeth and gums normal Neck: Supple, no lymphadenopathy;  thyroid:  no enlargement/tenderness/nodules; no carotid bruit or JVD Lungs: Clear to auscultation bilaterally without wheezes, rales or ronchi; respirations unlabored Heart: Regular rate and rhythm, S1 and S2 normal, no murmur, rubor gallop Abdomen: Soft, non-tender, nondistended, normoactive bowel sounds,  no masses, no hepatosplenomegaly Extremities: No clubbing, cyanosis or edema Pulses: 2+ and symmetric all extremities Skin:  Skin color, texture, turgor normal, no rashes or lesions Lymph nodes: Cervical, supraclavicular, and axillary nodes normal Neurologic:  CNII-XII intact, normal strength, sensation and gait; reflexes 2+ and symmetric throughout Psych: Normal mood, affect, hygiene and grooming.  ASSESSMENT/PLAN: Routine general medical examination at a health care facility - Plan: CBC with Differential/Platelet, Comprehensive metabolic panel, Lipid panel  Other specified hypothyroidism - Plan: TSH, levothyroxine (SYNTHROID) 100 MCG tablet  Allergic rhinitis due to pollen, unspecified seasonality  Osteopenia, unspecified location - Plan: CBC with Differential/Platelet, Comprehensive metabolic panel, DG Bone Density  Status post cataract extraction, unspecified laterality  Chronic urticaria  Allergy with anaphylaxis due to food  Aortic atherosclerosis (HCC) - Plan: Lipid panel  Urge incontinence She will continue be followed by allergy and immunology.  DEXA scan will be ordered in November.  She will continue to see ophthalmology on an as-needed basis.  Discussed the atherosclerosis with her and will place her on Lipitor. I then discussed the urge incontinence with her and at this point she is happy to use a pad and is not interested in potentially pursuing this further in being placed on any medications.    Discussed monthly self  breast exams and yearly mammograms; at least 30 minutes of aerobic activity at least 5 days/week and weight-bearing exercise 2x/week; proper sunscreen use reviewed; healthy diet, including goals of calcium and vitamin D intake and alcohol recommendations (less than or equal to 1 drink/day) reviewed; regular seatbelt use; changing batteries in smoke detectors.  Immunization recommendations discussed.  Colonoscopy recommendations reviewed   Medicare Attestation I have personally reviewed: The patient's medical and social history Their use of alcohol, tobacco or illicit drugs Their current medications and supplements The patient's functional ability including ADLs,fall risks, home safety risks, cognitive, and hearing and visual impairment Diet and physical activities Evidence for depression or mood disorders  The patient's weight, height, and BMI have been recorded in the chart.  I have made referrals, counseling, and provided education to the patient based on review of the above and I have provided the patient with a written personalized care plan for preventive services.     Jill Alexanders, MD   10/14/2020

## 2020-10-14 NOTE — Patient Instructions (Signed)
  [name] , Thank you for taking time to come for your Medicare Wellness Visit. I appreciate your ongoing commitment to your health goals. Please review the following plan we discussed and let me know if I can assist you in the future.   These are the goals we discussed: Goals   None     This is a list of the screening recommended for you and due dates:  Health Maintenance  Topic Date Due  . Tetanus Vaccine  Never done  . COVID-19 Vaccine (2 - Moderna 3-dose series) 10/20/2019  . Flu Shot  02/13/2021  . Mammogram  11/02/2021  . Colon Cancer Screening  04/11/2028  . DEXA scan (bone density measurement)  Completed  .  Hepatitis C: One time screening is recommended by Center for Disease Control  (CDC) for  adults born from [birth year] through 1964.   Completed  . HPV Vaccine  Aged Out  . Pneumonia vaccines  Discontinued

## 2020-10-15 LAB — COMPREHENSIVE METABOLIC PANEL
ALT: 23 IU/L (ref 0–32)
AST: 30 IU/L (ref 0–40)
Albumin/Globulin Ratio: 1.9 (ref 1.2–2.2)
Albumin: 4.7 g/dL (ref 3.7–4.7)
Alkaline Phosphatase: 82 IU/L (ref 44–121)
BUN/Creatinine Ratio: 23 (ref 12–28)
BUN: 16 mg/dL (ref 8–27)
Bilirubin Total: 0.5 mg/dL (ref 0.0–1.2)
CO2: 22 mmol/L (ref 20–29)
Calcium: 9.9 mg/dL (ref 8.7–10.3)
Chloride: 100 mmol/L (ref 96–106)
Creatinine, Ser: 0.69 mg/dL (ref 0.57–1.00)
Globulin, Total: 2.5 g/dL (ref 1.5–4.5)
Glucose: 84 mg/dL (ref 65–99)
Potassium: 4.9 mmol/L (ref 3.5–5.2)
Sodium: 140 mmol/L (ref 134–144)
Total Protein: 7.2 g/dL (ref 6.0–8.5)
eGFR: 92 mL/min/{1.73_m2} (ref >59)

## 2020-10-15 LAB — LIPID PANEL
Chol/HDL Ratio: 3 ratio (ref 0.0–4.4)
Cholesterol, Total: 231 mg/dL — ABNORMAL HIGH (ref 100–199)
HDL: 77 mg/dL (ref >39)
LDL Chol Calc (NIH): 143 mg/dL — ABNORMAL HIGH (ref 0–99)
Triglycerides: 64 mg/dL (ref 0–149)
VLDL Cholesterol Cal: 11 mg/dL (ref 5–40)

## 2020-10-15 LAB — CBC WITH DIFFERENTIAL/PLATELET
Basophils Absolute: 0 10*3/uL (ref 0.0–0.2)
Basos: 0 %
EOS (ABSOLUTE): 0.2 10*3/uL (ref 0.0–0.4)
Eos: 4 %
Hematocrit: 42.3 % (ref 34.0–46.6)
Hemoglobin: 14.4 g/dL (ref 11.1–15.9)
Immature Grans (Abs): 0 10*3/uL (ref 0.0–0.1)
Immature Granulocytes: 0 %
Lymphocytes Absolute: 1.5 10*3/uL (ref 0.7–3.1)
Lymphs: 26 %
MCH: 31 pg (ref 26.6–33.0)
MCHC: 34 g/dL (ref 31.5–35.7)
MCV: 91 fL (ref 79–97)
Monocytes Absolute: 0.7 10*3/uL (ref 0.1–0.9)
Monocytes: 11 %
Neutrophils Absolute: 3.5 10*3/uL (ref 1.4–7.0)
Neutrophils: 59 %
Platelets: 259 10*3/uL (ref 150–450)
RBC: 4.64 x10E6/uL (ref 3.77–5.28)
RDW: 13.1 % (ref 11.7–15.4)
WBC: 5.9 10*3/uL (ref 3.4–10.8)

## 2020-10-15 LAB — TSH: TSH: 3.13 u[IU]/mL (ref 0.450–4.500)

## 2020-10-19 ENCOUNTER — Ambulatory Visit: Payer: Self-pay

## 2020-10-19 ENCOUNTER — Ambulatory Visit (INDEPENDENT_AMBULATORY_CARE_PROVIDER_SITE_OTHER): Payer: Medicare Other

## 2020-10-19 DIAGNOSIS — Z23 Encounter for immunization: Secondary | ICD-10-CM

## 2020-10-19 NOTE — Progress Notes (Signed)
   Covid-19 Vaccination Clinic  Name:  [name]    MRN: [id number] DOB: 11-09-1945  10/19/2020  [name] was observed post Covid-19 immunization for 30 minutes based on pre-vaccination screening without incident. She was provided with Vaccine Information Sheet and instruction to access the V-Safe system.   [name] was instructed to call 911 with any severe reactions post vaccine: Marland Kitchen Difficulty breathing  . Swelling of face and throat  . A fast heartbeat  . A bad rash all over body  . Dizziness and weakness   Immunizations Administered    Name Date Dose VIS Date Route   Moderna COVID-19 Vaccine 10/19/2020  9:05 AM 0.5 mL 05/04/2020 Intramuscular   Manufacturer: Levan Hurst   Lot: [id number]   [place]: [id number]

## 2020-10-26 ENCOUNTER — Other Ambulatory Visit: Payer: Self-pay

## 2020-10-26 ENCOUNTER — Ambulatory Visit (INDEPENDENT_AMBULATORY_CARE_PROVIDER_SITE_OTHER): Payer: Medicare Other

## 2020-10-26 DIAGNOSIS — L501 Idiopathic urticaria: Secondary | ICD-10-CM | POA: Diagnosis not present

## 2020-10-31 NOTE — Patient Instructions (Addendum)
Chronic urticaria with recurrent anaphylaxis episodes Continue Xolair 300 mg every 2 weeks. Consider increasing to every 3 weeks at your next office visit. Continue Allegra (fexofenadine) 2 tablets twice a day Try decreasing  Pepcid (famotidine) 20 mg to 1 tablet once a day. If symptoms return go back to 1 tablet twice a day.  Please let us know if this treatment plan is not working well for you. Schedule a follow up appointment in 2 months or sooner if needed.

## 2020-11-02 ENCOUNTER — Other Ambulatory Visit: Payer: Self-pay

## 2020-11-02 ENCOUNTER — Telehealth: Payer: Self-pay

## 2020-11-02 ENCOUNTER — Ambulatory Visit: Payer: Self-pay

## 2020-11-02 ENCOUNTER — Encounter: Payer: Self-pay | Admitting: Family

## 2020-11-02 ENCOUNTER — Ambulatory Visit (INDEPENDENT_AMBULATORY_CARE_PROVIDER_SITE_OTHER): Payer: Medicare Other | Admitting: Family

## 2020-11-02 VITALS — BP 108/70 | HR 70 | Temp 97.9°F | Resp 16

## 2020-11-02 DIAGNOSIS — L501 Idiopathic urticaria: Secondary | ICD-10-CM | POA: Diagnosis not present

## 2020-11-02 NOTE — Progress Notes (Addendum)
[address], [address] [place] [address] Dept: [phone]  FOLLOW UP NOTE  Patient ID: [name], female    DOB: 1945-11-20  Age: 75 y.o. MRN: [id number] Date of Office Visit: 11/02/2020  Assessment  Chief Complaint: Urticaria  HPI [name] is a 75 year old female who presents today for follow-up of chronic idiopathic urticaria.  She was last seen on September 07, 2020 by [name], FNP for COVID component testing.  Since her last office visit she did receive her second Moderna COVID-19 vaccine on October 19, 2020 and reports that she did well.  Chronic urticaria is reported as well controlled with Xolair 300 mg injections every 2 weeks, Pepcid 20 mg twice a day, and Allegra 2 tablets twice a day.  She has not had any hives since her last office visit.  She is wondering if it is okay if she takes the generic brand  Allegra and Pepcid due to the cost.  Instructed her that it was fine but if she noticed her hives returning to start back on brand-name.   Drug Allergies:  Allergies  Allergen Reactions  . Levofloxacin   . Other Anaphylaxis  . Shellfish Allergy     Review of Systems: Review of Systems  Constitutional: Negative for chills and fever.  HENT:       Denies rhinorrhea, nasal congestion, and postnasal drip  Eyes:       Denies itchy watery eyes  Respiratory: Negative for cough, shortness of breath and wheezing.   Cardiovascular: Negative for chest pain and palpitations.  Gastrointestinal: Negative for heartburn.  Genitourinary: Negative for dysuria.  Skin: Negative for itching and rash.  Neurological: Positive for headaches.       Reports occasional headache that will go away after sitting down     Physical Exam: BP 108/70 (BP Location: Left Arm, Patient Position: Sitting, Cuff Size: Normal)   Pulse 70   Temp 97.9 F (36.6 C) (Temporal)   Resp 16   SpO2 98%    Physical Exam Constitutional:      Appearance: Normal appearance.  HENT:      Head: Normocephalic and atraumatic.     Comments: Pharynx normal. Eyes normal. Ears normal. Nose: bilateral lower turbinates mildly edematous and slightly erythematous with clear drainage noted    Right Ear: Tympanic membrane, ear canal and external ear normal.     Left Ear: Tympanic membrane, ear canal and external ear normal.     Mouth/Throat:     Mouth: Mucous membranes are moist.     Pharynx: Oropharynx is clear.  Eyes:     Conjunctiva/sclera: Conjunctivae normal.  Cardiovascular:     Rate and Rhythm: Regular rhythm.     Heart sounds: Normal heart sounds.  Pulmonary:     Effort: Pulmonary effort is normal.     Breath sounds: Normal breath sounds.     Comments: Lungs clear to auscultation Musculoskeletal:     Cervical back: Neck supple.  Skin:    General: Skin is warm.     Comments: No urticarial lesions noted  Neurological:     Mental Status: She is alert and oriented to person, place, and time.  Psychiatric:        Mood and Affect: Mood normal.        Behavior: Behavior normal.        Thought Content: Thought content normal.        Judgment: Judgment normal.     Diagnostics:  None  Assessment and Plan: 1. Chronic idiopathic urticaria     No orders of the defined types were placed in this encounter.   Patient Instructions  Chronic urticaria with recurrent anaphylaxis episodes Continue Xolair 300 mg every 2 weeks. Consider increasing to every 3 weeks at your next office visit. Continue Allegra (fexofenadine) 2 tablets twice a day Try decreasing  Pepcid (famotidine) 20 mg to 1 tablet once a day. If symptoms return go back to 1 tablet twice a day.  Please let us know if this treatment plan is not working well for you. Schedule a follow up appointment in 2 months or sooner if needed.   Return in about 2 months (around 01/02/2021), or if symptoms worsen or fail to improve.    Thank you for the opportunity to care for this patient.  Please do not hesitate to  contact me with questions.  [name], FNP [facility] [place] [facility]

## 2020-11-02 NOTE — Telephone Encounter (Signed)
Pt. calling to let us know that she takes pepcid 20 mg twice daily not the 40 mg of pepcid twice daily bc chrissie wanted her to cut back on some of her meds for hives. Pt. Calling back to make sure it was ok to just do the pepcid 20 mg once daily per chrissie. If symptoms return go back to twice a day. Mailed pt. And updated AVS.

## 2020-11-09 ENCOUNTER — Ambulatory Visit (INDEPENDENT_AMBULATORY_CARE_PROVIDER_SITE_OTHER): Payer: Medicare Other

## 2020-11-09 ENCOUNTER — Other Ambulatory Visit: Payer: Self-pay

## 2020-11-09 DIAGNOSIS — L501 Idiopathic urticaria: Secondary | ICD-10-CM

## 2020-11-23 ENCOUNTER — Ambulatory Visit (INDEPENDENT_AMBULATORY_CARE_PROVIDER_SITE_OTHER): Payer: Medicare Other

## 2020-11-23 ENCOUNTER — Other Ambulatory Visit: Payer: Self-pay

## 2020-11-23 DIAGNOSIS — L501 Idiopathic urticaria: Secondary | ICD-10-CM

## 2020-11-25 ENCOUNTER — Ambulatory Visit: Payer: Self-pay

## 2020-12-01 ENCOUNTER — Ambulatory Visit
Admission: RE | Admit: 2020-12-01 | Discharge: 2020-12-01 | Disposition: A | Discharge status: Home / Self Care | Payer: Medicare Other | Source: Ambulatory Visit | Attending: Family Medicine | Admitting: Family Medicine

## 2020-12-01 ENCOUNTER — Other Ambulatory Visit: Payer: Self-pay

## 2020-12-01 DIAGNOSIS — Z1231 Encounter for screening mammogram for malignant neoplasm of breast: Secondary | ICD-10-CM | POA: Diagnosis not present

## 2020-12-07 ENCOUNTER — Other Ambulatory Visit: Payer: Self-pay

## 2020-12-07 ENCOUNTER — Ambulatory Visit (INDEPENDENT_AMBULATORY_CARE_PROVIDER_SITE_OTHER): Payer: Medicare Other

## 2020-12-07 DIAGNOSIS — L501 Idiopathic urticaria: Secondary | ICD-10-CM

## 2020-12-21 ENCOUNTER — Other Ambulatory Visit: Payer: Self-pay

## 2020-12-21 ENCOUNTER — Ambulatory Visit (INDEPENDENT_AMBULATORY_CARE_PROVIDER_SITE_OTHER): Payer: Medicare Other

## 2020-12-21 DIAGNOSIS — L501 Idiopathic urticaria: Secondary | ICD-10-CM | POA: Diagnosis not present

## 2021-01-04 ENCOUNTER — Other Ambulatory Visit: Payer: Self-pay

## 2021-01-04 ENCOUNTER — Ambulatory Visit (INDEPENDENT_AMBULATORY_CARE_PROVIDER_SITE_OTHER): Payer: Medicare Other

## 2021-01-04 DIAGNOSIS — L501 Idiopathic urticaria: Secondary | ICD-10-CM

## 2021-01-04 DIAGNOSIS — L80 Vitiligo: Secondary | ICD-10-CM | POA: Diagnosis not present

## 2021-01-04 DIAGNOSIS — L720 Epidermal cyst: Secondary | ICD-10-CM | POA: Diagnosis not present

## 2021-01-04 DIAGNOSIS — Z08 Encounter for follow-up examination after completed treatment for malignant neoplasm: Secondary | ICD-10-CM | POA: Diagnosis not present

## 2021-01-04 DIAGNOSIS — Z85828 Personal history of other malignant neoplasm of skin: Secondary | ICD-10-CM | POA: Diagnosis not present

## 2021-01-10 NOTE — Patient Instructions (Addendum)
Chronic urticaria with recurrent anaphylaxis episodes Continue Xolair 300 mg every 2 weeks. If you are doing well at your next appointment we will change you to every 3 weeks. Make sure and pick up your new epinephrine autoinjector from the pharmacy. Continue Allegra (fexofenadine) 2 tablets twice a day Stop Pepcid (famotidine) 20 mg . If hives return call our office and restart Pepcid 20 mg once a day.  Please let us know if this treatment plan is not working well for you. Schedule a follow up appointment in 3 months or sooner if needed.

## 2021-01-11 ENCOUNTER — Ambulatory Visit (INDEPENDENT_AMBULATORY_CARE_PROVIDER_SITE_OTHER): Payer: Medicare Other | Admitting: Family

## 2021-01-11 ENCOUNTER — Other Ambulatory Visit: Payer: Self-pay

## 2021-01-11 ENCOUNTER — Encounter: Payer: Self-pay | Admitting: Family

## 2021-01-11 ENCOUNTER — Telehealth: Payer: Self-pay

## 2021-01-11 VITALS — BP 116/62 | HR 73 | Temp 97.3°F | Resp 18

## 2021-01-11 DIAGNOSIS — L501 Idiopathic urticaria: Secondary | ICD-10-CM

## 2021-01-11 NOTE — Telephone Encounter (Signed)
Called and informed patient. Patient verbalized understanding.  

## 2021-01-11 NOTE — Telephone Encounter (Signed)
Patient called back with questions about taking her Allegra 2 tablets twice a day and Xolair.  Patient stated she was under the express she would continue with Xolair every two weeks instead of the three weeks.  Please advise.

## 2021-01-11 NOTE — Progress Notes (Signed)
[place] [place], [address] [place] [address] Dept: [phone]  FOLLOW UP NOTE  Patient ID: [name], female    DOB: 1945-09-23  Age: 75 y.o. MRN: [id number] Date of Office Visit: 01/11/2021  Assessment  Chief Complaint: Follow-up  HPI [name] is a 75 year old female who presents today for follow-up of chronic idiopathic urticaria.  She was last seen on November 10, 2020 by [name], FNP.  Since her last office visit she has been diagnosed with vitiligo and was given a prescription of clobetasol.  She has not started this medication yet due to concerns after reading the info pamphlet.  Directed her to contact her dermatologist to address these concerns.  She also mentions that last Wednesday she had a cyst removed from the left side of her neck and is using mupirocin ointment 3 times a day.  Chronic idiopathic urticaria is reported as controlled with Xolair 300 mg every 2 weeks, Allegra 2 tablets twice a day, and famotidine 20 mg once a day.  She reports that she has not had any hives since we last saw her.  She also mentions that 1 day last week she forgot to take her evening dose of Allegra and did not develop any hives.  She is interested in seeing if she can cut down on any more of her medication for her hives.   Drug Allergies:  Allergies  Allergen Reactions   Levofloxacin    Other Anaphylaxis   Shellfish Allergy     Review of Systems: Review of Systems  Constitutional:  Negative for chills and fever.  HENT:         Denies rhinorrhea, nasal congestion, and postnasal drip  Eyes:        Reports occasional itchy watery eyes  Respiratory:  Negative for cough, shortness of breath and wheezing.   Cardiovascular:  Negative for chest pain and palpitations.  Gastrointestinal:  Negative for heartburn.       Denies heartburn or reflux symptoms  Genitourinary:  Negative for dysuria.  Skin:  Negative for itching and rash.  Neurological:  Positive for  headaches.    Physical Exam: BP 116/62 (BP Location: Left Arm, Patient Position: Sitting, Cuff Size: Normal)   Pulse 73   Temp (!) 97.3 F (36.3 C) (Temporal)   Resp 18   SpO2 97%    Physical Exam Constitutional:      Appearance: Normal appearance.  HENT:     Head: Normocephalic and atraumatic.     Comments: Pharynx normal, eyes normal, ears normal, nose normal    Right Ear: Tympanic membrane, ear canal and external ear normal.     Left Ear: Tympanic membrane, ear canal and external ear normal.     Nose: Nose normal.     Mouth/Throat:     Mouth: Mucous membranes are moist.     Pharynx: Oropharynx is clear.  Eyes:     Conjunctiva/sclera: Conjunctivae normal.  Cardiovascular:     Rate and Rhythm: Normal rate and regular rhythm.     Heart sounds: Normal heart sounds.  Pulmonary:     Effort: Pulmonary effort is normal.     Breath sounds: Normal breath sounds.     Comments: Lungs clear to auscultation Musculoskeletal:     Cervical back: Neck supple.  Skin:    General: Skin is warm.     Comments: No rashes or urticarial lesions noted.  Scabbing noted on back/left side of neck with slight erythema.  No oozing ,  foul odor, or bleeding noted  Neurological:     Mental Status: She is alert and oriented to person, place, and time.    Diagnostics: None  Assessment and Plan: 1. Chronic idiopathic urticaria     No orders of the defined types were placed in this encounter.   Patient Instructions  Chronic urticaria with recurrent anaphylaxis episodes Continue Xolair 300 mg every 2 weeks. If you are doing well at your next appointment we will change you to every 3 weeks. Make sure and pick up your new epinephrine autoinjector from the pharmacy. Continue Allegra (fexofenadine) 2 tablets twice a day Stop Pepcid (famotidine) 20 mg . If hives return call our office and restart Pepcid 20 mg once a day.  Please let us know if this treatment plan is not working well for you. Schedule  a follow up appointment in 3 months or sooner if needed. Return in about 3 months (around 04/13/2021), or if symptoms worsen or fail to improve.    Thank you for the opportunity to care for this patient.  Please do not hesitate to contact me with questions.  [name], FNP [facility]

## 2021-01-11 NOTE — Telephone Encounter (Signed)
Please let [name] know that I want her to continue the Allegra 2 tablets twice a day and the Xolair injections every 2 weeks.  If she is doing well at her next office visit we can then talk about it changing her Xolair injections to every 3 weeks.  The only change I wanted to make at today's office visit was stopping her Pepcid (famotidine) in the morning.

## 2021-01-18 ENCOUNTER — Ambulatory Visit (INDEPENDENT_AMBULATORY_CARE_PROVIDER_SITE_OTHER): Payer: Medicare Other

## 2021-01-18 ENCOUNTER — Other Ambulatory Visit: Payer: Self-pay

## 2021-01-18 DIAGNOSIS — L501 Idiopathic urticaria: Secondary | ICD-10-CM

## 2021-02-01 ENCOUNTER — Other Ambulatory Visit: Payer: Self-pay

## 2021-02-01 ENCOUNTER — Ambulatory Visit (INDEPENDENT_AMBULATORY_CARE_PROVIDER_SITE_OTHER): Payer: Medicare Other | Admitting: *Deleted

## 2021-02-01 DIAGNOSIS — L501 Idiopathic urticaria: Secondary | ICD-10-CM

## 2021-02-01 MED ORDER — OMALIZUMAB 150 MG/ML ~~LOC~~ SOSY
150.0000 mg | PREFILLED_SYRINGE | SUBCUTANEOUS | Status: DC
  Administered 2021-02-01: 150 mg via SUBCUTANEOUS

## 2021-02-08 ENCOUNTER — Telehealth: Payer: Self-pay

## 2021-02-08 NOTE — Telephone Encounter (Signed)
Patient called stating she is having issues with muscle pain and she seen online that Xolair can cause joint pains after being on Xolair for a prolonged time. Patient states its been going on for a few months  but it is now getting worse. She is wondering what would be the next step with her injections & figuring out what is causing the joint pain?   Please Advise

## 2021-02-08 NOTE — Telephone Encounter (Signed)
Please have her schedule an appointment to discuss other options. She has done so well with her hives since being on Xolair. Please let her know that Xolair is the safest drug option. It looks like she is on a statin, Lipitor (atorvastatin). How long has she been on this medication. Statins can cause muscle pain too.

## 2021-02-08 NOTE — Telephone Encounter (Signed)
Chrissie do you have any recommendation?

## 2021-02-10 NOTE — Telephone Encounter (Signed)
Called and left a voicemail asking for patient to return call to discuss.  °

## 2021-02-15 ENCOUNTER — Ambulatory Visit (INDEPENDENT_AMBULATORY_CARE_PROVIDER_SITE_OTHER): Payer: Medicare Other

## 2021-02-15 ENCOUNTER — Other Ambulatory Visit: Payer: Self-pay

## 2021-02-15 DIAGNOSIS — L501 Idiopathic urticaria: Secondary | ICD-10-CM

## 2021-02-15 NOTE — Telephone Encounter (Signed)
Patient came in today to get her Xolair injections, patient has a few concerns. She stated that after she received her Xolair two weeks ago on 02/01/2021 and that Friday she started experiencing weakness in her legs. She said she read about Xolair and it stated muscle weakness as a side affect. Patient is concerned because Saturday 02/04/2021 and Sunday 02/05/2021 she could barley walk unless she was holding onto someone. She said she called last week however never received a call back until Saturday. I did inform patient that we are not open on Saturday and per documentation Ashleigh called and left a message on Friday 02/10/2021. Patient doesn't want to be on Xolair the rest of her life and is looking to come off of it. Patient has an appointment on 04/12/2021 to discuss her reaction and stopping Xolair. I did tell patient that of she was having any issues after her Xolair injections today to call the office. Patient verbalized understanding.

## 2021-02-15 NOTE — Telephone Encounter (Signed)
Patient has been schedule for 02/22/2021 at 8 am.

## 2021-02-15 NOTE — Telephone Encounter (Signed)
Please have her schedule an appointment with me next Wednesday (02/22/2021) @ 8 am to discuss other options.

## 2021-02-15 NOTE — Telephone Encounter (Signed)
Thank you :)

## 2021-02-16 NOTE — Telephone Encounter (Signed)
Please call and see how Jessice did with her Xolair injection yesterday.  Thank you, [name], FNP

## 2021-02-16 NOTE — Telephone Encounter (Signed)
Left a message for patient to call the office to see how she is doing since her Xolair injection yesterday 02/15/2021.

## 2021-02-17 NOTE — Telephone Encounter (Signed)
Left another message for patient to call the office to see how she was doing since her Xolair injections on Wednesday 02/15/2021.

## 2021-02-21 NOTE — Telephone Encounter (Signed)
Patient called and said that she was feeling better and don't need that appointment 02/22/2021 with Chrissie in [place].

## 2021-02-22 ENCOUNTER — Ambulatory Visit: Payer: Medicare Other | Admitting: Family

## 2021-02-22 NOTE — Telephone Encounter (Signed)
Called and spoke with the patient and she stated that she has not been having any hives. She is aggreeable to trying to push the Xolair injections out to every 3 weeks to see how she does.

## 2021-02-22 NOTE — Telephone Encounter (Signed)
Please call Barb and letter know that I am glad that she is feeling better.  Has she had any hives since her last office visit ?  If she has not had any hives since her last office visit, see if she is agreeable to increasing her Xolair injections to every 3 weeks rather than every 2 weeks.

## 2021-02-23 NOTE — Telephone Encounter (Signed)
Please change Le's Xolair  300 mg injections to every 3 weeks.

## 2021-02-23 NOTE — Telephone Encounter (Signed)
Updated flowsheet to reflect this change, left a message for patient to call the office to inform her of Chrissie's recommendation. Will need to reschedule her 03/01/2021 Xolair appointment out a week  to 03/08/2021.[name]

## 2021-02-23 NOTE — Telephone Encounter (Signed)
Noted,  Thank you!

## 2021-02-23 NOTE — Telephone Encounter (Signed)
Patient called and I rescheduled her for 03/08/21.

## 2021-02-23 NOTE — Telephone Encounter (Signed)
Noted will change to every 3 weeks

## 2021-03-01 ENCOUNTER — Ambulatory Visit: Payer: Self-pay

## 2021-03-06 NOTE — Telephone Encounter (Signed)
Thank you. Is she having any problems with pain after her Xolair injections?

## 2021-03-06 NOTE — Telephone Encounter (Signed)
Spoke with patient, Informed her of Chrissie's recommendation. Patient will space out every 3 weeks for a while then if she is still doing well we will go to every 4 week. Patient verbalized understanding.

## 2021-03-06 NOTE — Telephone Encounter (Signed)
Patient called and stated that she has been feeling fine and has not had any issues and is wondering if she can stop the Xolair injections and see how she does.

## 2021-03-06 NOTE — Telephone Encounter (Signed)
Please let  Aalivia know that I recommend we start spacing them out. Has she started getting her Xolair injections every 3 weeks now? If she does well with every 3 weeks for a while we can then increase to every 4 weeks. I just hate to stop and the hives return.  Is she having any pain after Xolair injections?

## 2021-03-07 NOTE — Telephone Encounter (Signed)
Thank you :)

## 2021-03-07 NOTE — Telephone Encounter (Signed)
Patient denied any pain after her injections.

## 2021-03-08 ENCOUNTER — Ambulatory Visit (INDEPENDENT_AMBULATORY_CARE_PROVIDER_SITE_OTHER): Payer: Medicare Other

## 2021-03-08 ENCOUNTER — Other Ambulatory Visit: Payer: Self-pay

## 2021-03-08 DIAGNOSIS — L501 Idiopathic urticaria: Secondary | ICD-10-CM | POA: Diagnosis not present

## 2021-03-08 MED ORDER — OMALIZUMAB 150 MG/ML ~~LOC~~ SOSY
300.0000 mg | PREFILLED_SYRINGE | SUBCUTANEOUS | Status: DC
  Administered 2021-03-08 – 2022-11-26 (×29): 300 mg via SUBCUTANEOUS

## 2021-03-13 DIAGNOSIS — Z79899 Other long term (current) drug therapy: Secondary | ICD-10-CM | POA: Diagnosis not present

## 2021-03-13 DIAGNOSIS — Z961 Presence of intraocular lens: Secondary | ICD-10-CM | POA: Diagnosis not present

## 2021-03-28 ENCOUNTER — Other Ambulatory Visit: Payer: Self-pay | Admitting: Allergy & Immunology

## 2021-03-29 ENCOUNTER — Other Ambulatory Visit: Payer: Self-pay

## 2021-03-29 ENCOUNTER — Ambulatory Visit (INDEPENDENT_AMBULATORY_CARE_PROVIDER_SITE_OTHER): Payer: Medicare Other | Admitting: *Deleted

## 2021-03-29 DIAGNOSIS — L501 Idiopathic urticaria: Secondary | ICD-10-CM | POA: Diagnosis not present

## 2021-04-12 ENCOUNTER — Other Ambulatory Visit: Payer: Self-pay

## 2021-04-12 ENCOUNTER — Encounter: Payer: Self-pay | Admitting: Allergy & Immunology

## 2021-04-12 ENCOUNTER — Ambulatory Visit (INDEPENDENT_AMBULATORY_CARE_PROVIDER_SITE_OTHER): Payer: Medicare Other | Admitting: Allergy & Immunology

## 2021-04-12 ENCOUNTER — Ambulatory Visit: Payer: Medicare Other | Admitting: Family

## 2021-04-12 DIAGNOSIS — Z91013 Allergy to seafood: Secondary | ICD-10-CM | POA: Diagnosis not present

## 2021-04-12 DIAGNOSIS — L501 Idiopathic urticaria: Secondary | ICD-10-CM

## 2021-04-12 NOTE — Progress Notes (Signed)
FOLLOW UP  Date of Service/Encounter:  04/12/21   Assessment:   Chronic idiopathic urticaria - doing well on Xolair every 3 weeks plus suppressive antihistamines  Anaphylaxis to shellfish  Plan/Recommendations:    1. Chronic urticaria with recurrent anaphylaxis episodes - Continue with Allegra 1.5 tablets twice daily (use the pill splitter to cut the tablets in half).  - Continue with Xolair every THREE weeks. - We can decrease the Allegra further at the next visit.   2.  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 - EpiPen is up-to-date.  3. Return in about 6 months (around 10/10/2021).    Subjective:   [name] is a 75 y.o. female presenting today for follow up of No chief complaint on file.   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  [name] is a 75 y.o. female presenting for a follow up visit.  She was last seen in June 2022.  At that time, we will continue her Xolair 3 mg every 2 weeks.  The plan was to space it out to every 3 weeks.  We continue with Allegra 2 tablets twice daily.  We stopped her Pepcid.  Since the last visit, she has done very well. She has not had any problems with her hives. She is down to getting her Xolair every 3 weeks. This seems to be the sweet spot. She is doing Allegra two tablets twice daily. She has not tried decreasing it.  She is very open to it.  She is on Bactroban and has some concerns about the side effects. This was evidently prescribed by Dr. Nevada Crane, the dermatologist,  which she did not realize.  She will call him with questions about that medication.  It is unclear why she was prescribed this medication.  She has a brown-tinged rash on her neck and arms which she says was being treated with the Bactroban.  Regardless, I do know if this rash is and it does not have anything to do with her hives.  She is eating everything aside from shellfish.  She had a previous history of anaphylaxis to shellfish.  This occurred well before her urticaria started.  She continues to see her friend Louie Casa.  She describes him as " constantly sick", although she thinks he is just a baby.  She feels that he is holding her back.  She wants to do traveling and have fun, but he just likes to set at home and watch [employer] shows.  She says that she knows she does not need him and she reports that he is just holding her back.  Otherwise, there have been no changes to her past medical history, surgical history, family history, or social history.    Review of Systems  Constitutional: Negative.  Negative for chills, fever, malaise/fatigue and weight loss.  HENT: Negative.  Negative for congestion, ear discharge and ear pain.   Eyes:  Negative for pain, discharge and redness.  Respiratory:  Negative for cough, sputum production, shortness of breath and wheezing.   Cardiovascular: Negative.  Negative for chest pain and palpitations.  Gastrointestinal:  Negative for abdominal pain, constipation, diarrhea, heartburn, nausea and vomiting.  Skin: Negative.  Negative for itching and rash.  Neurological:  Negative for dizziness and headaches.  Endo/Heme/Allergies:  Negative for environmental allergies. Does not bruise/bleed easily.      Objective:   There were no vitals taken for this visit. There is no height or weight on file to calculate BMI.   Physical Exam:  Physical Exam Vitals reviewed.  Constitutional:      Appearance: She is well-developed.     Comments: Super talkative.   HENT:     Head: Normocephalic and atraumatic.     Right Ear: Tympanic membrane, ear canal and external ear normal.     Left Ear: Tympanic membrane, ear canal and external ear normal.     Nose: No nasal deformity, septal deviation, mucosal edema or rhinorrhea.     Right Turbinates: Not enlarged or swollen.     Left Turbinates: Not enlarged or swollen.     Right Sinus: No maxillary sinus tenderness or frontal sinus tenderness.     Left Sinus: No maxillary sinus tenderness or frontal sinus tenderness.     Mouth/Throat:     Mouth: Mucous membranes are not pale and not dry.     Pharynx: Uvula midline.  Eyes:     General: Lids are normal. No allergic shiner.       Right eye: No discharge.        Left eye: No discharge.     Conjunctiva/sclera: Conjunctivae normal.     Right eye: Right conjunctiva is not injected. No chemosis.    Left eye: Left conjunctiva is not injected. No chemosis.    Pupils: Pupils are equal, round, and reactive to light.  Cardiovascular:     Rate and Rhythm: Normal rate and regular rhythm.     Heart sounds: Normal heart sounds.  Pulmonary:     Effort: Pulmonary effort is normal. No tachypnea, accessory muscle usage or respiratory distress.     Breath sounds: Normal breath sounds. No wheezing, rhonchi or rales.  Chest:     Chest wall: No tenderness.  Lymphadenopathy:     Cervical: No cervical adenopathy.  Skin:    Coloration: Skin is not pale.     Findings: No abrasion, erythema, petechiae or rash. Rash is not papular, urticarial or vesicular.  Neurological:     Mental Status: She is alert.  Psychiatric:        Behavior: Behavior is cooperative.     Diagnostic studies: none    Salvatore Marvel, MD  [facility]

## 2021-04-12 NOTE — Patient Instructions (Addendum)
1. Chronic urticaria with recurrent anaphylaxis episodes - Continue with Allegra 1.5 tablets twice daily (use the pill splitter to cut the tablets in half).  - Continue with Xolair every THREE weeks. - We can decrease the Allegra further at the next visit.   2. Return in about 6 months (around 10/10/2021).    Please inform us of any Emergency Department visits, hospitalizations, or changes in symptoms. Call us before going to the ED for breathing or allergy symptoms since we might be able to fit you in for a sick visit. Feel free to contact us anytime with any questions, problems, or concerns.  It was a pleasure to see you again today!  Websites that have reliable patient information: 1. American Academy of Asthma, Allergy, and Immunology: www.aaaai.org 2. Food Allergy Research and Education (FARE): foodallergy.org 3. Mothers of Asthmatics: http://www.asthmacommunitynetwork.org 4. American College of Allergy, Asthma, and Immunology: www.acaai.org   COVID-19 Vaccine Information can be found at: ShippingScam.co.uk For questions related to vaccine distribution or appointments, please email vaccine@[email or web address] or call [phone].     "Like" [place] on Facebook and Instagram for our latest updates!     HAPPY FALL!     Make sure you are registered to vote! If you have moved or changed any of your contact information, you will need to get this updated before voting!  In some cases, you MAY be able to register to vote online: [email or web address]

## 2021-04-19 ENCOUNTER — Ambulatory Visit (INDEPENDENT_AMBULATORY_CARE_PROVIDER_SITE_OTHER): Payer: Medicare Other

## 2021-04-19 ENCOUNTER — Other Ambulatory Visit: Payer: Self-pay

## 2021-04-19 DIAGNOSIS — L501 Idiopathic urticaria: Secondary | ICD-10-CM

## 2021-05-02 IMAGING — MG DIGITAL SCREENING BILAT W/ TOMO W/ CAD
8 series · 8 of 24 positions shown · non-contrast
Comparison: Previous exam(s).

CLINICAL DATA: Screening.

EXAM:
DIGITAL SCREENING BILATERAL MAMMOGRAM WITH TOMO AND CAD

[L CC synth-2D]
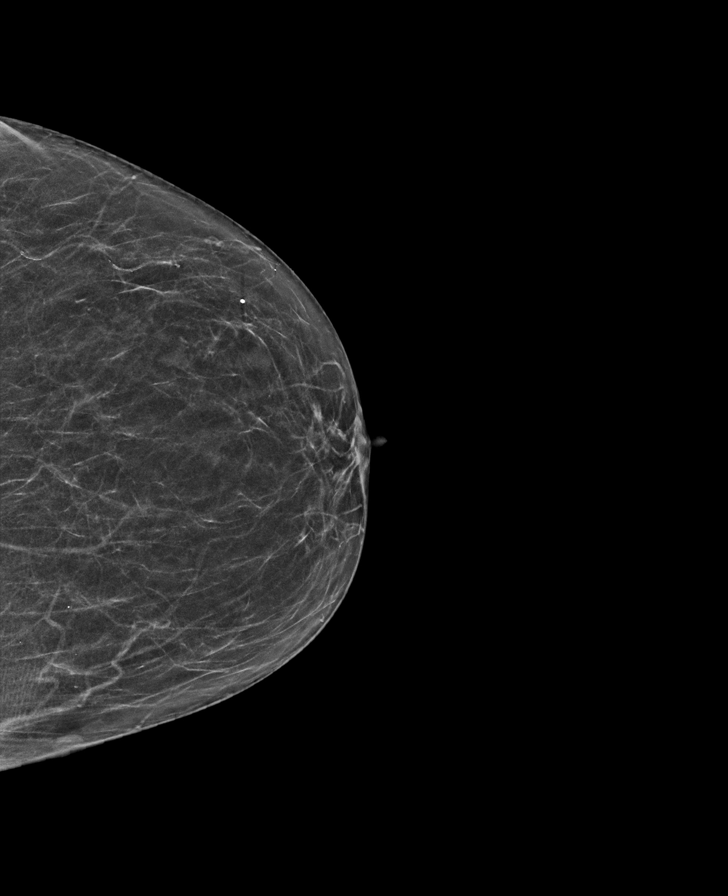

[R MLO synth-2D]
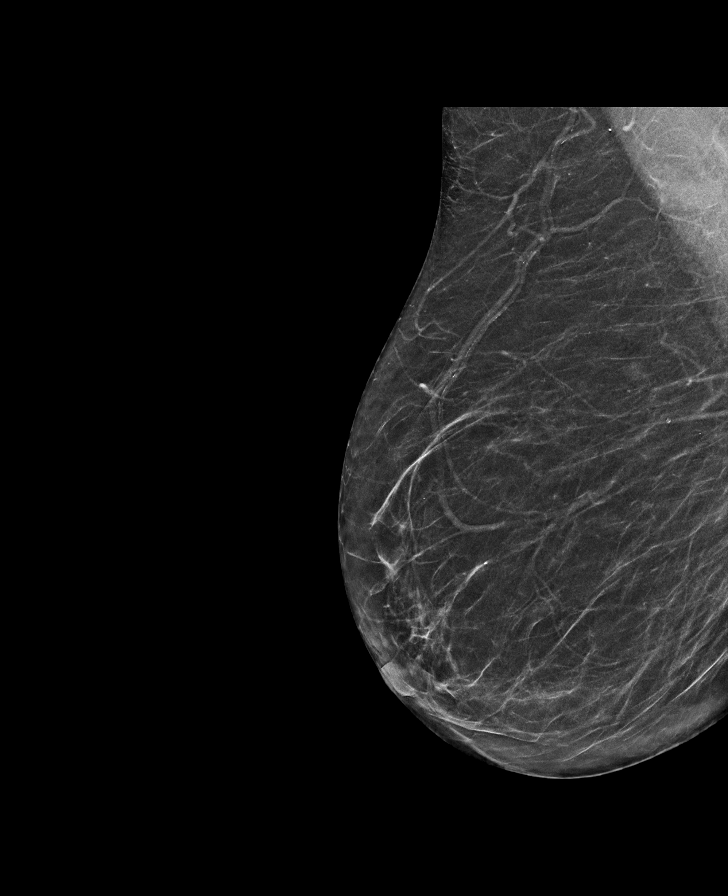

[R CC synth-2D]
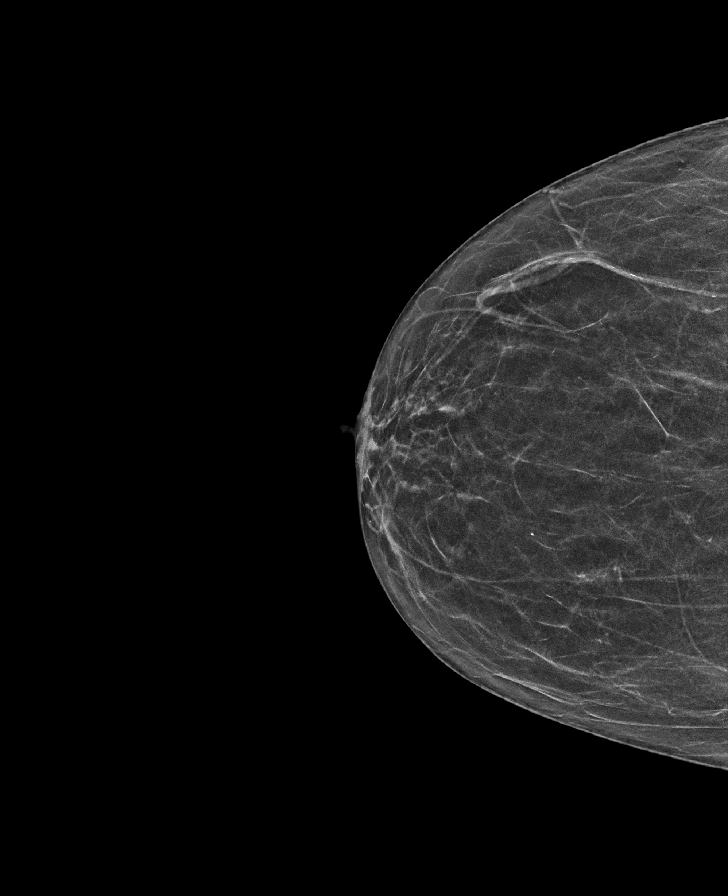

[L MLO synth-2D]
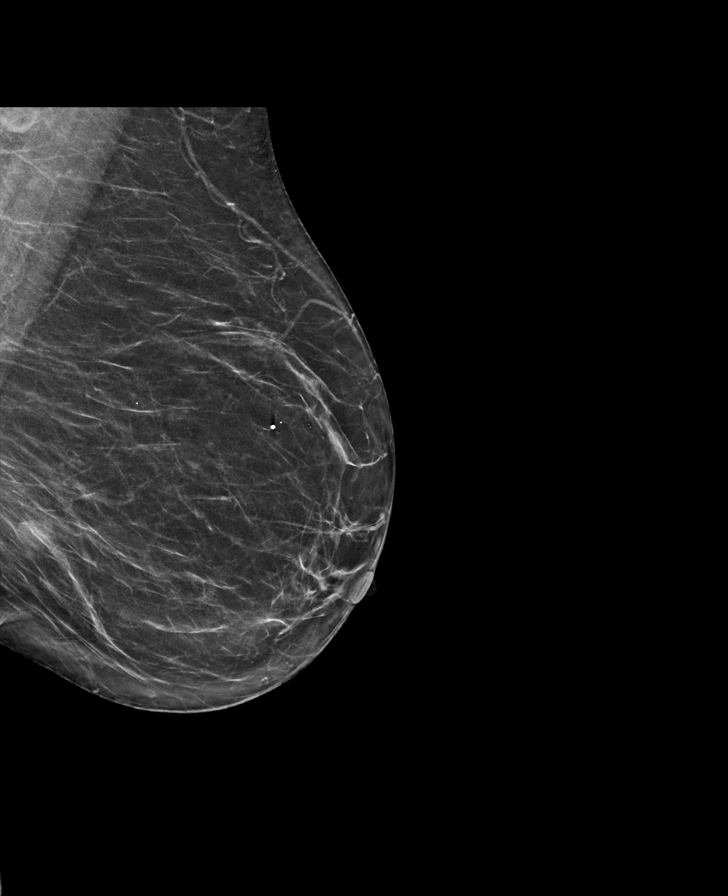

[R MLO tomo · tomo slice 33/65.0]
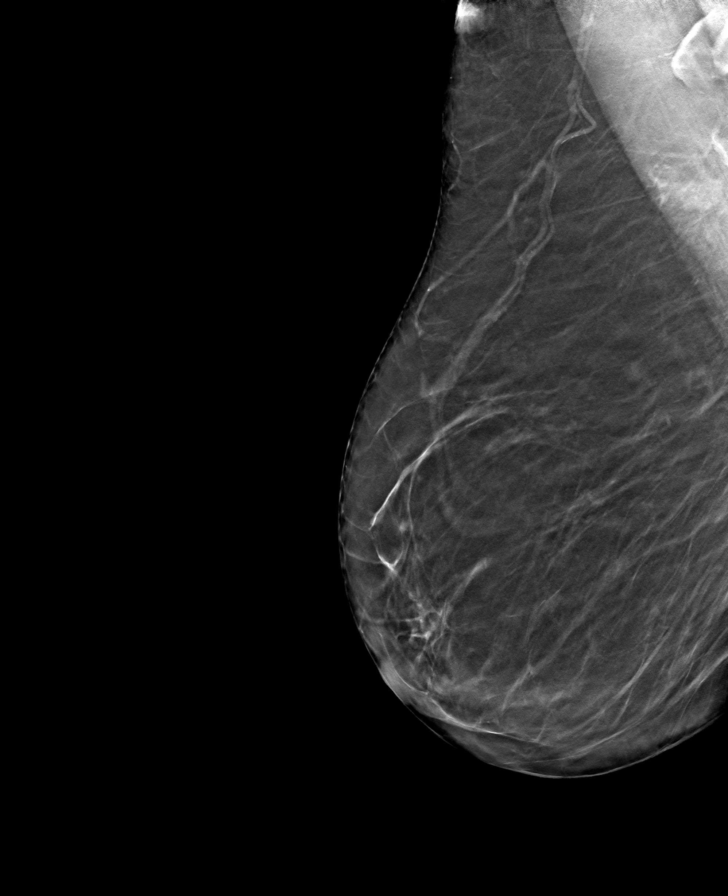

[L CC tomo · tomo slice 29/56.0]
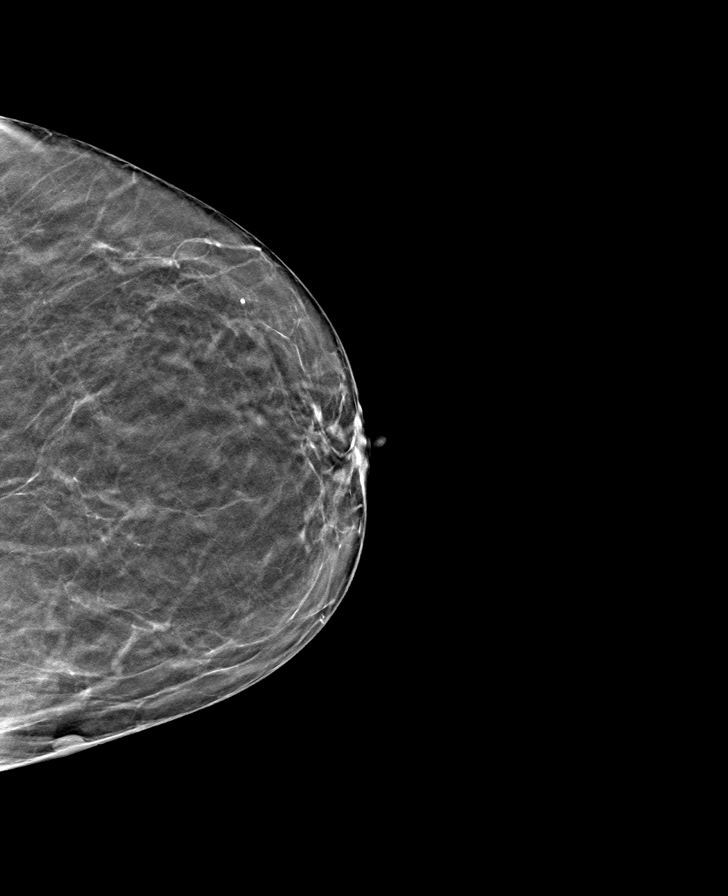

[L MLO tomo · tomo slice 33/65.0]
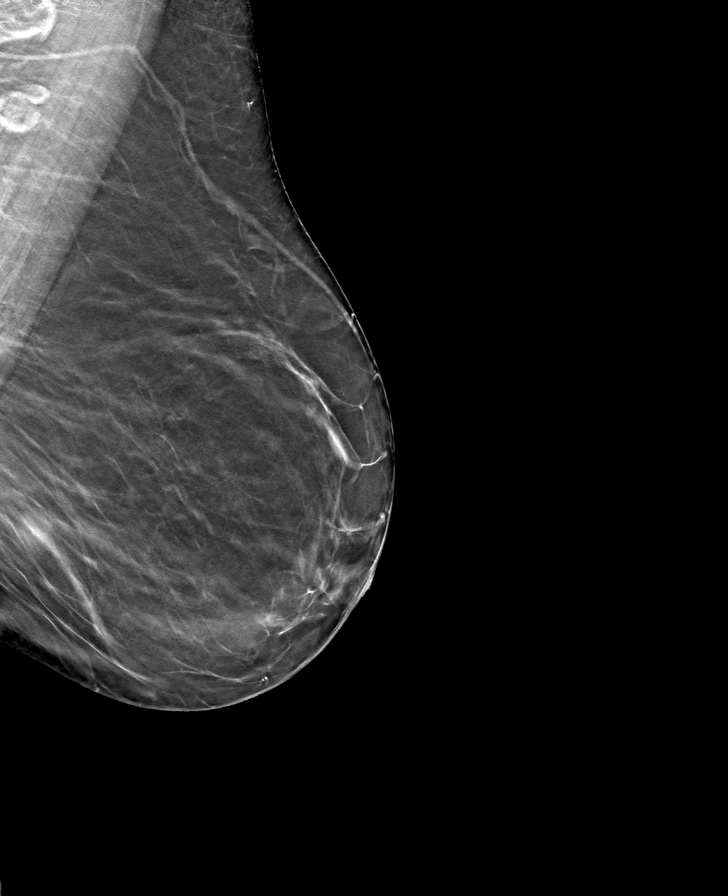

[R CC tomo · tomo slice 28/55.0]
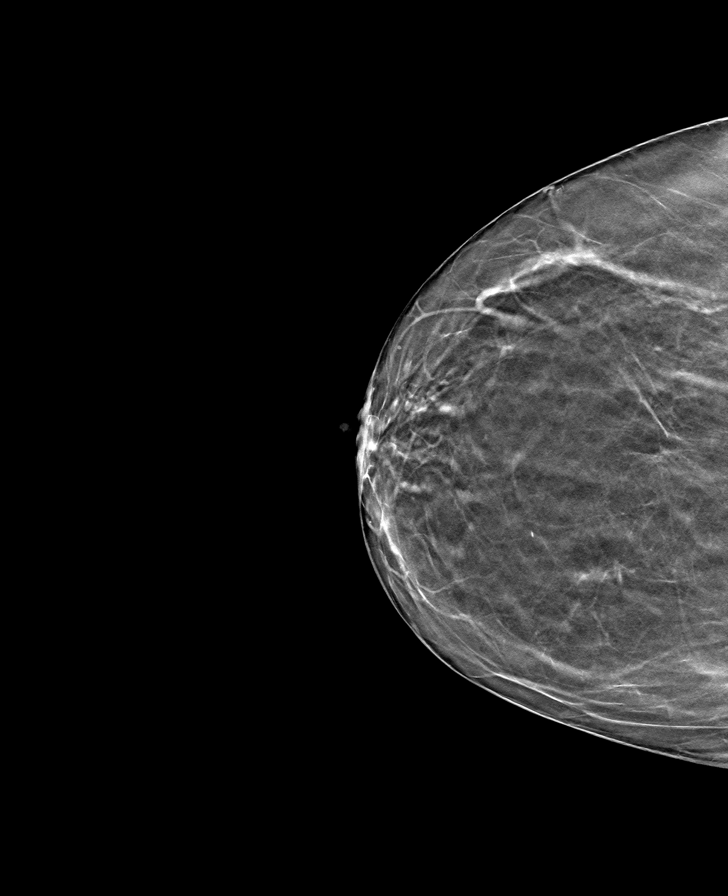

[8 of 24 positions shown; findings below may reference images not displayed]

ACR Breast Density Category b: There are scattered areas of
fibroglandular density.
FINDINGS: There are no findings suspicious for malignancy. Images were
processed with CAD.
IMPRESSION: No mammographic evidence of malignancy. A result letter of this
screening mammogram will be mailed directly to the patient.

RECOMMENDATION:
Screening mammogram in one year. (Code:[id number]-775)

BI-RADS CATEGORY  1: Negative.

## 2021-05-10 ENCOUNTER — Ambulatory Visit (INDEPENDENT_AMBULATORY_CARE_PROVIDER_SITE_OTHER): Payer: Medicare Other

## 2021-05-10 ENCOUNTER — Other Ambulatory Visit: Payer: Self-pay

## 2021-05-10 DIAGNOSIS — L501 Idiopathic urticaria: Secondary | ICD-10-CM

## 2021-05-15 ENCOUNTER — Other Ambulatory Visit (INDEPENDENT_AMBULATORY_CARE_PROVIDER_SITE_OTHER): Payer: Medicare Other

## 2021-05-15 ENCOUNTER — Other Ambulatory Visit: Payer: Self-pay

## 2021-05-15 DIAGNOSIS — Z23 Encounter for immunization: Secondary | ICD-10-CM | POA: Diagnosis not present

## 2021-05-31 ENCOUNTER — Telehealth: Payer: Self-pay

## 2021-05-31 ENCOUNTER — Other Ambulatory Visit: Payer: Self-pay

## 2021-05-31 ENCOUNTER — Ambulatory Visit (INDEPENDENT_AMBULATORY_CARE_PROVIDER_SITE_OTHER): Payer: Medicare Other

## 2021-05-31 DIAGNOSIS — L501 Idiopathic urticaria: Secondary | ICD-10-CM

## 2021-05-31 NOTE — Telephone Encounter (Signed)
Patient stopped by very upset because she found out that she has a bill with our office. She states she hasn't been receiving anything via mail from our office.Patient states she spoke to someone but I am unsure who she spoke to. Patient states she didn't know she had a copay for each one of these injections. Patient is requesting a payment plan and to not be sent into collections.

## 2021-06-23 ENCOUNTER — Ambulatory Visit (INDEPENDENT_AMBULATORY_CARE_PROVIDER_SITE_OTHER): Payer: Medicare Other

## 2021-06-23 ENCOUNTER — Other Ambulatory Visit: Payer: Self-pay

## 2021-06-23 DIAGNOSIS — L501 Idiopathic urticaria: Secondary | ICD-10-CM | POA: Diagnosis not present

## 2021-06-27 ENCOUNTER — Other Ambulatory Visit: Payer: Self-pay | Admitting: *Deleted

## 2021-06-27 MED ORDER — OMALIZUMAB 150 MG/ML ~~LOC~~ SOSY: 300.0000 mg | PREFILLED_SYRINGE | SUBCUTANEOUS | 11 refills | Status: DC

## 2021-07-12 ENCOUNTER — Ambulatory Visit (INDEPENDENT_AMBULATORY_CARE_PROVIDER_SITE_OTHER): Payer: Medicare Other | Admitting: *Deleted

## 2021-07-12 ENCOUNTER — Other Ambulatory Visit: Payer: Self-pay

## 2021-07-12 DIAGNOSIS — L501 Idiopathic urticaria: Secondary | ICD-10-CM

## 2021-08-02 ENCOUNTER — Ambulatory Visit (INDEPENDENT_AMBULATORY_CARE_PROVIDER_SITE_OTHER): Payer: Medicare Other

## 2021-08-02 ENCOUNTER — Other Ambulatory Visit: Payer: Self-pay

## 2021-08-02 DIAGNOSIS — L501 Idiopathic urticaria: Secondary | ICD-10-CM

## 2021-08-23 ENCOUNTER — Ambulatory Visit: Payer: Medicare Other

## 2021-08-23 ENCOUNTER — Other Ambulatory Visit: Payer: Self-pay

## 2021-08-23 ENCOUNTER — Ambulatory Visit (INDEPENDENT_AMBULATORY_CARE_PROVIDER_SITE_OTHER): Payer: Medicare Other

## 2021-08-23 DIAGNOSIS — L501 Idiopathic urticaria: Secondary | ICD-10-CM | POA: Diagnosis not present

## 2021-09-04 ENCOUNTER — Encounter: Payer: Self-pay | Admitting: Family Medicine

## 2021-09-04 ENCOUNTER — Other Ambulatory Visit: Payer: Self-pay

## 2021-09-04 ENCOUNTER — Ambulatory Visit (INDEPENDENT_AMBULATORY_CARE_PROVIDER_SITE_OTHER): Payer: Medicare Other | Admitting: Family Medicine

## 2021-09-04 VITALS — BP 142/88 | HR 80 | Temp 97.8°F | Wt 142.8 lb

## 2021-09-04 DIAGNOSIS — Z7189 Other specified counseling: Secondary | ICD-10-CM | POA: Diagnosis not present

## 2021-09-04 DIAGNOSIS — B354 Tinea corporis: Secondary | ICD-10-CM | POA: Diagnosis not present

## 2021-09-04 NOTE — Progress Notes (Signed)
° °  Subjective:    Patient ID: [name], female    DOB: October 29, 1945, 76 y.o.   MRN: [id number]  HPI She is here for evaluation of a lesion on her left breast.  She has been using Neosporin on it with no success.  It has not grown or cause any difficulty.  She is also had slight discomfort with the nipple of the left breast but again is very intermittent only occurring once or twice per month and discussed slight itching.  She then discussed the fact that she just broke up from a 5-year relationship.  She is quite distraught over this.   Review of Systems     Objective:   Physical Exam Alert and tearful.  Circular erythematous lesion noted on the left outer breast area.  The nipple appears normal.       Assessment & Plan:  Bereavement counseling  Tinea corporis I recommend she try Lamisil AF judiciously on the breast lesion and she can also use cortisone on the nipple problem I explained that I thought these were relatively benign problems. I then discussed the recent break-up of a 5-year relationship with her.  She was disappointed with the fact that he said he left her but that it was more of a friendship thinning also.  She had thoughts that was more than just that.  Discussed the fact that this is like a death and all those thoughts and feelings that she is having a legitimate.  At this point I do not think medication is needed but if she has trouble with sleep down the road, I will call something in.  Greater than 30 minutes spent discussing all these issues with her.

## 2021-09-04 NOTE — Patient Instructions (Signed)
Try Lamisil AF daily or twice per day is okay for at least 2 to 3 weeks

## 2021-09-13 ENCOUNTER — Other Ambulatory Visit: Payer: Self-pay

## 2021-09-13 ENCOUNTER — Ambulatory Visit (INDEPENDENT_AMBULATORY_CARE_PROVIDER_SITE_OTHER): Payer: Medicare Other

## 2021-09-13 DIAGNOSIS — L501 Idiopathic urticaria: Secondary | ICD-10-CM

## 2021-09-14 IMAGING — US US THYROID
1 series · 13 of 25 positions shown · non-contrast
Comparison: Chest CT-03/10/2020

CLINICAL DATA: Incidental on CT. Enlargement of the left lobe of
the thyroid on chest CT performed 03/10/2020. Remote history
radioactive iodine ablation.

EXAM:
THYROID ULTRASOUND
TECHNIQUE: Ultrasound examination of the thyroid gland and adjacent soft
tissues was performed.

[Series 1: us thyroid · 0.08mm/px · 13 of 74 slices shown]
[im 1/74]
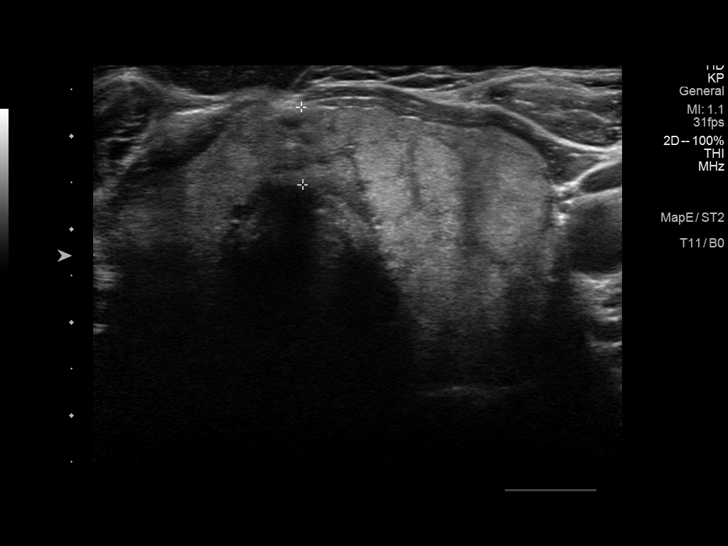
[im 7/74]
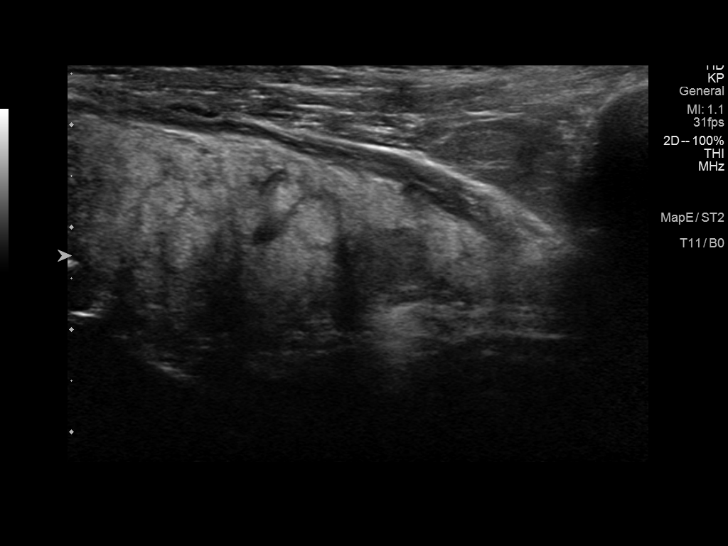
[im 13/74]
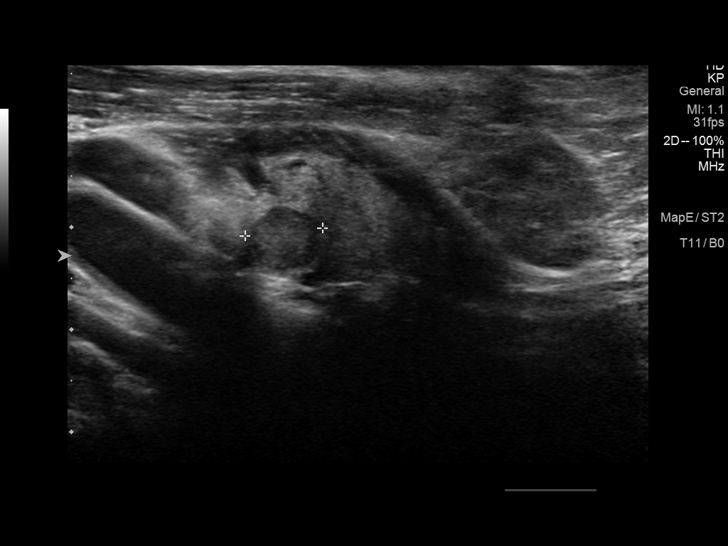
[im 19/74]
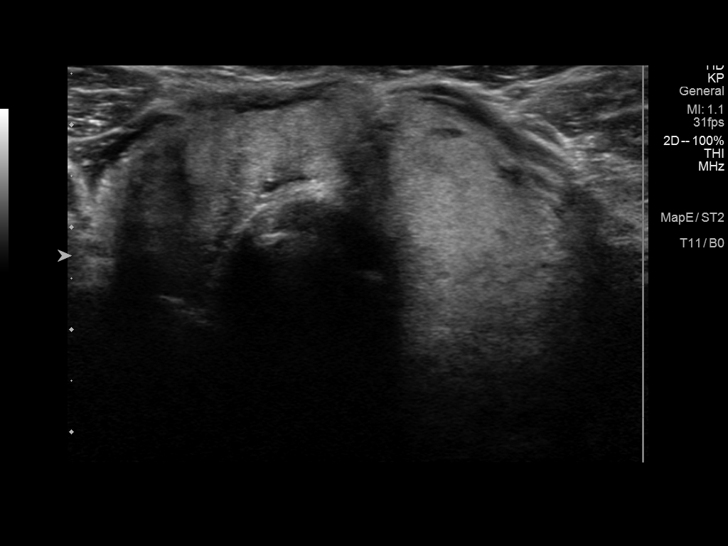
[im 25/74]
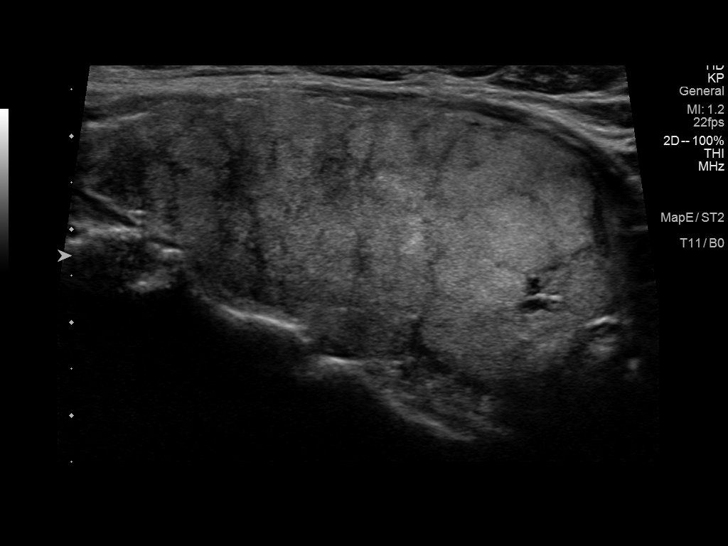
[im 31/74]
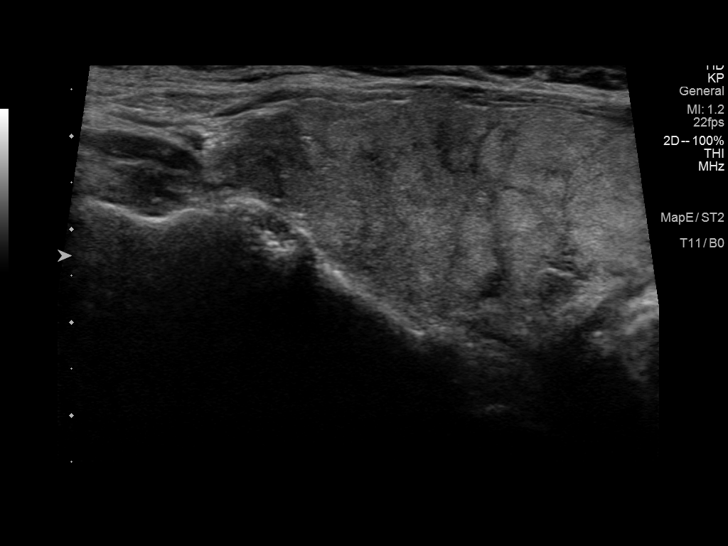
[im 37/74]
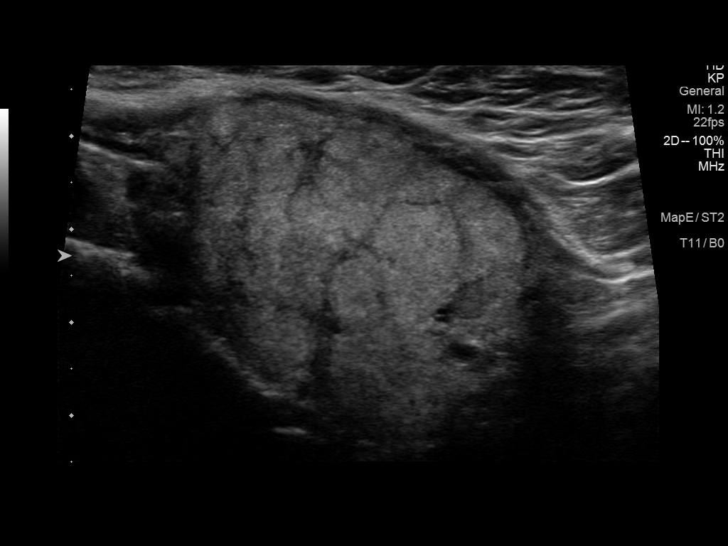
[im 43/74]
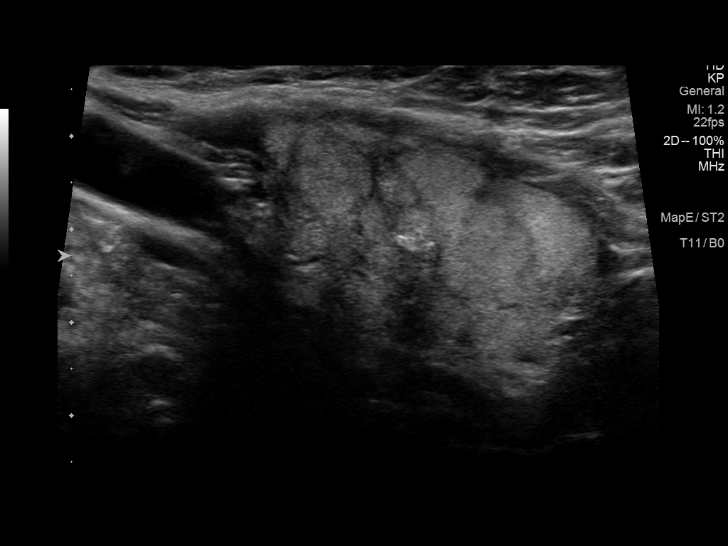
[im 49/74]
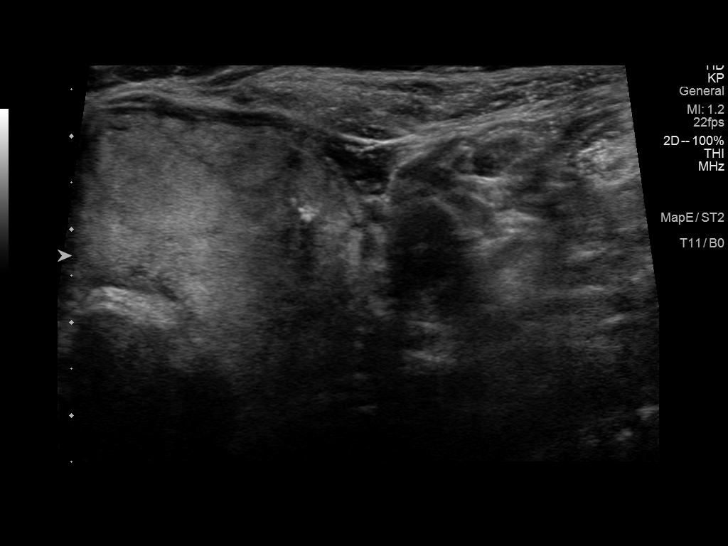
[im 55/74]
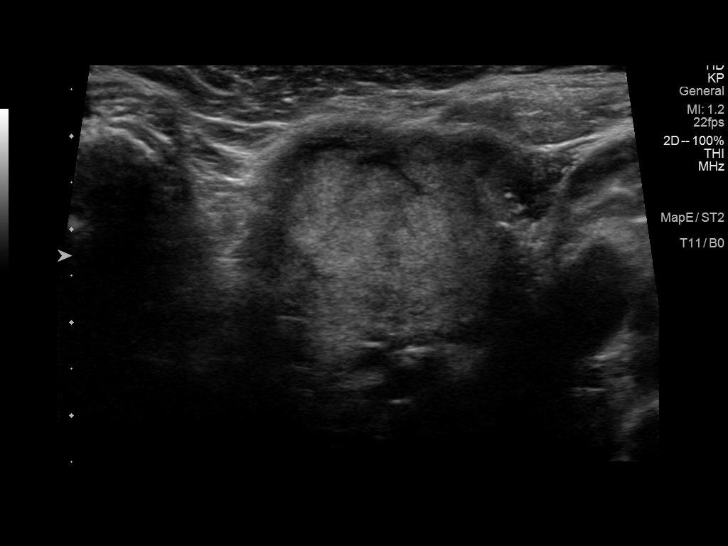
[im 61/74]
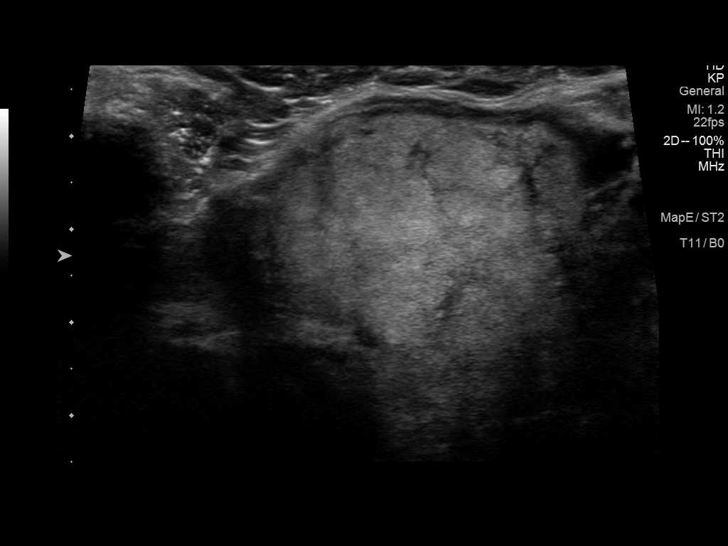
[im 67/74]
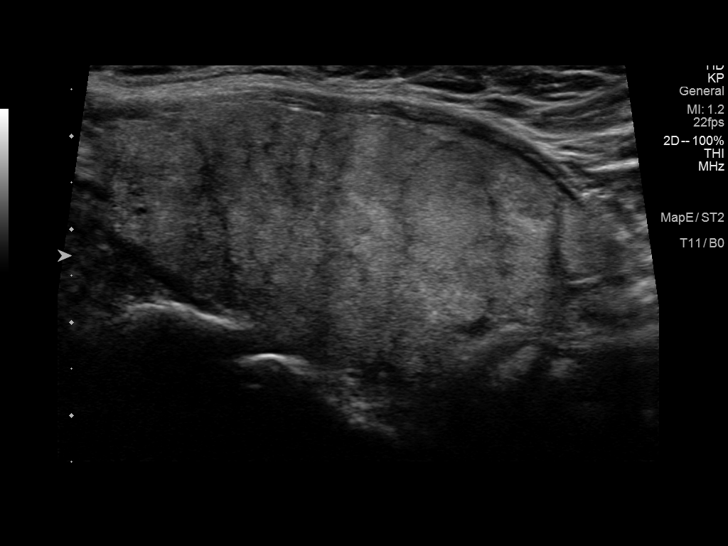
[im 74/74]
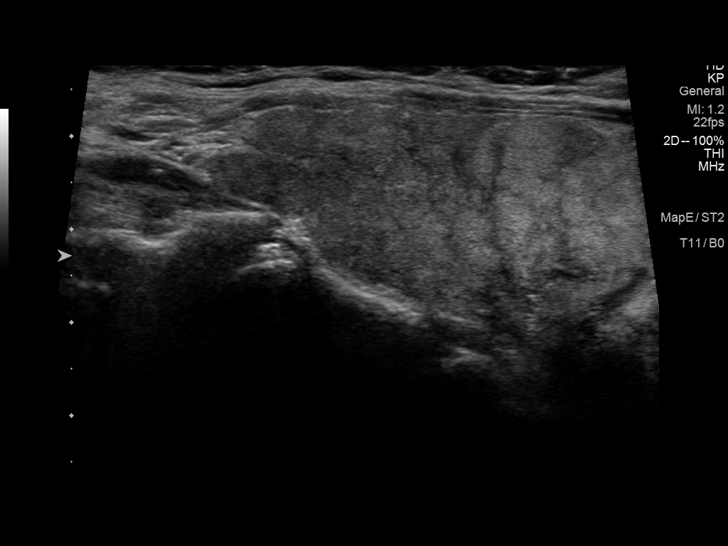

[13 of 25 positions shown; findings below may reference images not displayed]

FINDINGS: Parenchymal Echotexture: Markedly heterogenous suspected diffuse
glandular hyperemia (images 9, 22 and 30, 58 and 59)

Isthmus: Borderline enlarged measures 0.8 cm in diameter

Right lobe: Borderline enlarged measuring 5.1 x 1.7 x 2.2 cm

Left lobe: Enlarged measuring 6.3 x 3.2 x 3.7 cm

_________________________________________________________

Estimated total number of nodules >/= 1 cm: 0

Number of spongiform nodules >/=  2 cm not described below (TR1): 0

Number of mixed cystic and solid nodules >/= 1.5 cm not described
below (TR2): 0

_________________________________________________________

There is a punctate (approximately 0.8 x 0.8 x 0.6 cm) hypoechoic
nodule/pseudonodule within the inferior pole of the right lobe of
the thyroid which does not meet criteria to recommend percutaneous
sampling or continued dedicated follow-up.

Punctate shadowing macro calcifications within the left lobe of the
thyroid without associated nodule.
IMPRESSION: Enlarged, markedly heterogeneous and hyperemic thyroid without
discrete worrisome nodule or mass. Findings are nonspecific though
could be seen in the setting of a thyroiditis.

The above is in keeping with the ACR TI-RADS recommendations - [HOSPITAL] 3352;[DATE].

## 2021-09-28 ENCOUNTER — Telehealth: Payer: Self-pay | Admitting: Family Medicine

## 2021-09-28 NOTE — Telephone Encounter (Signed)
Left message asking pt to call [phone] ? ?I wanted to see if patient could do AWV with Pamala Hurry prior to her appt with dr Redmond School on 10/23/21. ? ?Can schedule awv calendar year with [facility] ?

## 2021-10-02 ENCOUNTER — Encounter: Payer: Self-pay | Admitting: Family Medicine

## 2021-10-03 ENCOUNTER — Ambulatory Visit
Admission: EM | Admit: 2021-10-03 | Discharge: 2021-10-03 | Disposition: A | Discharge status: Home / Self Care | Payer: Medicare Other | Attending: Family Medicine | Admitting: Family Medicine

## 2021-10-03 ENCOUNTER — Other Ambulatory Visit: Payer: Self-pay

## 2021-10-03 DIAGNOSIS — J029 Acute pharyngitis, unspecified: Secondary | ICD-10-CM | POA: Diagnosis not present

## 2021-10-03 DIAGNOSIS — Z20828 Contact with and (suspected) exposure to other viral communicable diseases: Secondary | ICD-10-CM | POA: Diagnosis not present

## 2021-10-03 LAB — POCT RAPID STREP A (OFFICE): Rapid Strep A Screen: NEGATIVE

## 2021-10-03 NOTE — Discharge Instructions (Signed)
You may use over the counter ibuprofen or acetaminophen as needed.  For a sore throat, over the counter products such as [employer] Peroxyl Mouth Sore Rinse or Chloraseptic Sore Throat Spray may provide some temporary relief. Your rapid strep test was negative today. We have sent your throat swab for culture and will let you know of any positive results. 

## 2021-10-03 NOTE — ED Triage Notes (Signed)
Pt states for about a week she has had a sore throat and body aches ? ?Pt states he has a dry throat ? ?Denies Fever ? ?Pt states she has been taking Tylenol ?

## 2021-10-04 ENCOUNTER — Ambulatory Visit (INDEPENDENT_AMBULATORY_CARE_PROVIDER_SITE_OTHER): Payer: Medicare Other

## 2021-10-04 ENCOUNTER — Telehealth: Payer: Self-pay | Admitting: Family Medicine

## 2021-10-04 DIAGNOSIS — L501 Idiopathic urticaria: Secondary | ICD-10-CM

## 2021-10-04 LAB — COVID-19, FLU A+B NAA
Influenza A, NAA: NOT DETECTED
Influenza B, NAA: NOT DETECTED
SARS-CoV-2, NAA: NOT DETECTED

## 2021-10-04 NOTE — Telephone Encounter (Signed)
L/m on cell phone asking pt to call [phone] ? ?Please cancel 10/06/21 AWV with Pamala Hurry. ? ?[employer] changed awvs from calendar year to 366 days. ?

## 2021-10-04 NOTE — ED Provider Notes (Signed)
?[facility] ? ? ?[id number] ?10/03/21 Arrival Time: 4431 ? ?ASSESSMENT & PLAN: ? ?1. Sore throat   ?2. Exposure to the flu   ? ?Rapid strep negative. COVID/flu testing sent ?OTC symptom care as needed. ? ? Follow-up Information   ? ? Denita Lung, MD.   ?Specialty: Family Medicine ?Why: If worsening or failing to improve as anticipated. ?Contact information: ?[address] ?[place] [address] ?[phone] ? ? ?  ?  ? ?  ?  ? ?  ? ? ?Reviewed expectations re: course of current medical issues. Questions answered. ?Outlined signs and symptoms indicating need for more acute intervention. ?Understanding verbalized. ?After Visit Summary given. ? ? ?SUBJECTIVE: ?History from: Patient. ?[name] is a 76 y.o. female. Pt states for about a week she has had a sore throat and body aches ? ?Pt states he has a dry throat ? ?Denies Fever ? ?Pt states she has been taking Tylenol ?Would like to be tested for COVID. ?Normal PO intake without n/v/d. ? ?OBJECTIVE: ? ?Vitals:  ? 10/03/21 1744  ?BP: (!) 166/88  ?Pulse: 77  ?Resp: 16  ?Temp: 98.6 ?F (37 ?C)  ?TempSrc: Oral  ?SpO2: 95%  ?  ?General appearance: alert; no distress ?Eyes: PERRLA; EOMI; conjunctiva normal ?HENT: [place]; AT; with mild nasal congestion ?Neck: supple  ?Lungs: speaks full sentences without difficulty; unlabored; clear ?Extremities: no edema ?Skin: warm and dry ?Neurologic: normal gait ?Psychological: alert and cooperative; normal mood and affect ? ?Labs: ?Results for orders placed or performed during the hospital encounter of 10/03/21  ?POCT rapid strep A  ?Result Value Ref Range  ? Rapid Strep A Screen Negative Negative  ? ?Labs Reviewed  ?COVID-19, FLU A+B NAA  ?POCT RAPID STREP A (OFFICE)  ? ? ?Allergies  ?Allergen Reactions  ? Levofloxacin   ? Other Anaphylaxis  ? Shellfish Allergy   ? ? ?Past Medical History:  ?Diagnosis Date  ? Allergy   ? RHINITIS  ? Osteoporosis   ? OSTEOPENIA  ? Thyroid disease   ? HYPOTHYROID  ? Urticaria    ? ?Social History  ? ?Socioeconomic History  ? Marital status: Widowed  ?  Spouse name: Not on file  ? Number of children: Not on file  ? Years of education: Not on file  ? Highest education level: Not on file  ?Occupational History  ? Not on file  ?Tobacco Use  ? Smoking status: Never  ? Smokeless tobacco: Never  ?Vaping Use  ? Vaping Use: Never used  ?Substance and Sexual Activity  ? Alcohol use: No  ? Drug use: No  ? Sexual activity: Yes  ?  Birth control/protection: None  ?Other Topics Concern  ? Not on file  ?Social History Narrative  ? Not on file  ? ?Social Determinants of Health  ? ?Financial Resource Strain: Not on file  ?Food Insecurity: Not on file  ?Transportation Needs: Not on file  ?Physical Activity: Not on file  ?Stress: Not on file  ?Social Connections: Not on file  ?Intimate Partner Violence: Not on file  ? ?Family History  ?Problem Relation Age of Onset  ? Stroke Mother   ? Arthritis Mother   ? Allergic rhinitis Mother   ? Allergic rhinitis Brother   ? Allergic rhinitis Maternal Grandmother   ? Parkinson's disease Brother   ? Asthma Neg Hx   ? Eczema Neg Hx   ? Urticaria Neg Hx   ? ?Past Surgical History:  ?Procedure Laterality Date  ?  APPENDECTOMY    ? arm surgery Right   ? CATARACT EXTRACTION, BILATERAL Bilateral   ? CHOLECYSTECTOMY    ? SINOSCOPY    ? SKIN BIOPSY Left 02/28/2018  ? shave lower left back neurofibroma  ? ?  ?Vanessa Kick, MD ?10/04/21 [phone] ? ?

## 2021-10-06 ENCOUNTER — Other Ambulatory Visit: Payer: Self-pay | Admitting: Family Medicine

## 2021-10-06 DIAGNOSIS — I700 Atherosclerosis of aorta: Secondary | ICD-10-CM

## 2021-10-11 ENCOUNTER — Ambulatory Visit: Payer: Medicare Other | Admitting: Allergy & Immunology

## 2021-10-11 ENCOUNTER — Other Ambulatory Visit: Payer: Self-pay

## 2021-10-11 ENCOUNTER — Encounter: Payer: Self-pay | Admitting: Allergy & Immunology

## 2021-10-11 VITALS — BP 130/78 | HR 75 | Resp 16

## 2021-10-11 DIAGNOSIS — Z91013 Allergy to seafood: Secondary | ICD-10-CM

## 2021-10-11 DIAGNOSIS — L501 Idiopathic urticaria: Secondary | ICD-10-CM

## 2021-10-11 NOTE — Patient Instructions (Addendum)
1. Chronic urticaria with recurrent anaphylaxis episodes ?- Decrease to one Allegra TWICE DAILY for TWO WEEKS. ?- THEN decrease to one Allegra DAILY for TWO WEEKS. ?- THEN decrease to 1/2 daily from then onward.  ?- Continue with Xolair every THREE weeks. ?- We will space out the Xolair at the next visit.  ? ?2. Return in about 4 months (around 02/10/2022).  ? ? ?Please inform us of any Emergency Department visits, hospitalizations, or changes in symptoms. Call us before going to the ED for breathing or allergy symptoms since we might be able to fit you in for a sick visit. Feel free to contact us anytime with any questions, problems, or concerns. ? ?It was a pleasure to see you again today! CONGRATS on getting rid of that dude!  ? ?Websites that have reliable patient informat ?Dr. Neldon Mc ion: ?1. American Academy of Asthma, Allergy, and Immunology: www.aaaai.org ?2. Food Allergy Research and Education (FARE): foodallergy.org ?3. Mothers of Asthmatics: http://www.asthmacommunitynetwork.org ?4. [employer] of Allergy, Asthma, and Immunology: MonthlyElectricBill.co.uk ? ? ?COVID-19 Vaccine Information can be found at: ShippingScam.co.uk For questions related to vaccine distribution or appointments, please email [email or web address] or call [phone].  ? ? ? ??Like? [place] on Facebook and Instagram for our latest updates!  ?  ? ? ? ?Make sure you are registered to vote! If you have moved or changed any of your contact information, you will need to get this updated before voting! ? ?In some cases, you MAY be able to register to vote online: [email or web address] ? ? ? ? ?

## 2021-10-11 NOTE — Progress Notes (Signed)
? ?FOLLOW UP ? ?Date of Service/Encounter:  10/11/21 ? ? ?Assessment:  ? 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  ?Anaphylaxis to shellfish ? ?Plan/Recommendations:  ? ?1. Chronic urticaria with recurrent anaphylaxis episodes ?- Decrease to one Allegra TWICE DAILY for TWO WEEKS. ?- THEN decrease to one Allegra DAILY for TWO WEEKS. ?- THEN decrease to 1/2 daily from then onward.  ?- Continue with Xolair every THREE weeks. ?- We will space out the Xolair at the next visit.  ? ?2. Return in about 4 months (around 02/10/2022).  ? ? ? ?Subjective:  ? ?[name] is a 76 y.o. female presenting today for follow up of  ?Chief Complaint  ?Patient presents with  ? Urticaria  ? ? ?[name] has a history of the following: ?Patient Active Problem List  ? Diagnosis Date Noted  ? Urge incontinence 10/14/2020  ? Chronic idiopathic urticaria 11/23/2019  ? Elevated antinuclear antibody (ANA) level 10/19/2019  ? 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 S/P cataract surgery 09/18/2018  ? Vitiligo 04/07/2015  ? Hypothyroid 05/29/2011  ? Allergic rhinitis due to pollen 05/29/2011  ? Osteopenia 05/29/2011  ? ? ?History obtained from: chart review and patient. ? ?[name] is a 76 y.o. female presenting for a follow up visit.  She was last seen in September 2022.  At that time, we weaned her Xolair to every 3 weeks.  We continued her on Allegra 1.5 tablets twice a day.  Her shellfish allergy, we recommended continued avoidance. ? ?Since last visit, she has done very well.  She remains on the Allegra 1 and half tablets twice a day as well as Xolair every 3 weeks.  This seems to be working very well.  She has not had any hive outbreaks.  She would like to decrease the medications. ? ?She does report some weight gain since last visit.  She is wondering if Allegra had anything to do with that.  She is doing Zumba once a week and Silver Sneakers 2-3 times a week.  She loves her some carbs. She does have hypothyroidism and she is having a regular follow up next month. So she might need her thyroid replacement adjusted.  ? ?She continues to avoid shellfish. No accidental exposures. She is not interested in introducing it again into her diet.  ? ?She did finally break up with that jerk of her boyfriend.  She is now talking to a nice guy from her church named [name].  He is widowed as of last August.  So they have a lot in common. Her family is thrilled because  ? ?Otherwise, there have been no changes to her past medical history, surgical history, family history, or social history. ? ? ? ?Review of Systems  ?Constitutional: Negative.  Negative for fever, malaise/fatigue and weight loss.  ?HENT: Negative.  Negative for congestion, ear discharge and ear pain.   ?Eyes:  Negative for pain, discharge and redness.  ?Respiratory:  Negative for cough, sputum production, shortness of breath and wheezing.   ?Cardiovascular: Negative.  Negative for chest pain and palpitations.  ?Gastrointestinal:  Negative for abdominal pain and heartburn.  ?Skin: Negative.  Negative for itching and rash.  ?Neurological:  Negative for dizziness and headaches.  ?Endo/Heme/Allergies:  Negative for environmental allergies. Does not bruise/bleed easily.   ? ? ? ?Objective:  ? ?Blood pressure 130/78, pulse 75, resp. rate 16, SpO2 95 %. ?There is no height  or weight on file to calculate BMI. ? ? ? ?Physical Exam ?Vitals reviewed.  ?Constitutional:   ?   Appearance: She is well-developed.  ?   Comments: Super talkative and friendly. Appreciative.   ?HENT:  ?   Head: Normocephalic and atraumatic.  ?   Right Ear: Tympanic membrane, ear canal and external ear normal.  ?   Left Ear: Tympanic membrane, ear canal and external ear normal.  ?   Nose: No nasal deformity, septal deviation, mucosal edema or rhinorrhea.  ?   Right Turbinates: Not enlarged, swollen or pale.  ?   Left Turbinates: Not enlarged, swollen or pale.   ?   Right Sinus: No maxillary sinus tenderness or frontal sinus tenderness.  ?   Left Sinus: No maxillary sinus tenderness or frontal sinus tenderness.  ?   Mouth/Throat:  ?   Mouth: Mucous membranes are not pale and not dry.  ?   Pharynx: Uvula midline.  ?Eyes:  ?   General: Lids are normal. No allergic shiner.    ?   Right eye: No discharge.     ?   Left eye: No discharge.  ?   Conjunctiva/sclera: Conjunctivae normal.  ?   Right eye: Right conjunctiva is not injected. No chemosis. ?   Left eye: Left conjunctiva is not injected. No chemosis. ?   Pupils: Pupils are equal, round, and reactive to light.  ?Cardiovascular:  ?   Rate and Rhythm: Normal rate and regular rhythm.  ?   Heart sounds: Normal heart sounds.  ?Pulmonary:  ?   Effort: Pulmonary effort is normal. No tachypnea, accessory muscle usage or respiratory distress.  ?   Breath sounds: Normal breath sounds. No wheezing, rhonchi or rales.  ?Chest:  ?   Chest wall: No tenderness.  ?Lymphadenopathy:  ?   Cervical: No cervical adenopathy.  ?Skin: ?   Coloration: Skin is not pale.  ?   Findings: No abrasion, erythema, petechiae or rash. Rash is not papular, urticarial or vesicular.  ?Neurological:  ?   Mental Status: She is alert.  ?Psychiatric:     ?   Behavior: Behavior is cooperative.  ?  ? ?Diagnostic studies: none ? ? ? ? ? ?  ?Salvatore Marvel, MD  ?[facility] ? ? ? ? ? ? ?

## 2021-10-23 ENCOUNTER — Ambulatory Visit (INDEPENDENT_AMBULATORY_CARE_PROVIDER_SITE_OTHER): Payer: Medicare Other | Admitting: Family Medicine

## 2021-10-23 ENCOUNTER — Encounter: Payer: Self-pay | Admitting: Family Medicine

## 2021-10-23 ENCOUNTER — Other Ambulatory Visit: Payer: Self-pay | Admitting: Family Medicine

## 2021-10-23 VITALS — BP 126/78 | HR 66 | Temp 97.9°F | Ht 62.0 in | Wt 141.6 lb

## 2021-10-23 DIAGNOSIS — J301 Allergic rhinitis due to pollen: Secondary | ICD-10-CM

## 2021-10-23 DIAGNOSIS — Z1231 Encounter for screening mammogram for malignant neoplasm of breast: Secondary | ICD-10-CM

## 2021-10-23 DIAGNOSIS — I700 Atherosclerosis of aorta: Secondary | ICD-10-CM | POA: Diagnosis not present

## 2021-10-23 DIAGNOSIS — Z23 Encounter for immunization: Secondary | ICD-10-CM | POA: Diagnosis not present

## 2021-10-23 DIAGNOSIS — N3941 Urge incontinence: Secondary | ICD-10-CM

## 2021-10-23 DIAGNOSIS — E038 Other specified hypothyroidism: Secondary | ICD-10-CM

## 2021-10-23 DIAGNOSIS — Z9849 Cataract extraction status, unspecified eye: Secondary | ICD-10-CM | POA: Diagnosis not present

## 2021-10-23 DIAGNOSIS — M8580 Other specified disorders of bone density and structure, unspecified site: Secondary | ICD-10-CM

## 2021-10-23 DIAGNOSIS — L501 Idiopathic urticaria: Secondary | ICD-10-CM | POA: Diagnosis not present

## 2021-10-23 DIAGNOSIS — Z0000 Encounter for general adult medical examination without abnormal findings: Secondary | ICD-10-CM | POA: Diagnosis not present

## 2021-10-23 MED ORDER — LEVOTHYROXINE SODIUM 100 MCG PO TABS: ORAL_TABLET | ORAL | 3 refills | Status: DC

## 2021-10-23 MED ORDER — ATORVASTATIN CALCIUM 20 MG PO TABS: ORAL_TABLET | ORAL | 3 refills | Status: DC

## 2021-10-23 NOTE — Progress Notes (Signed)
[name] is a 76 y.o. female who presents for annual wellness visit and follow-up on chronic medical conditions.  She has a history of hypothyroidism and continues on Synthroid.  She is also taking atorvastatin and does have a history of aortic atherosclerosis.  She is followed by allergist for her underlying chronic idiopathic urticaria and is doing well on Xolair.  She also is using Allegra.  She does have underlying allergic rhinitis.  She does have difficulty with urge incontinence and now feels as if she is having some symptoms of prolapse.  She also has a history of osteopenia and has been on vitamin D supplementation as well as calcium.  She has had cataract surgery.  She was dating someone for 5 years however that relationship ended and she is quite happy with her present situation. ? ?Immunizations and Health Maintenance ?Immunization History  ?Administered Date(s) Administered  ? DTaP 02/13/2002  ? Fluad Quad(high Dose 65+) 03/12/2019, 10/06/2020, 05/15/2021  ? Influenza Split 05/29/2011, 05/12/2012, 04/28/2013  ? Influenza, High Dose Seasonal PF 05/12/2014, 04/07/2015, 05/17/2016, 04/11/2017, 04/17/2018  ? Moderna Sars-Covid-2 Vaccination 09/22/2019, 10/19/2020  ? Pneumococcal Conjugate-13 12/15/2013  ? Pneumococcal Polysaccharide-23 02/13/2002  ? Tdap 11/02/2020  ? Zoster Recombinat (Shingrix) 05/01/2018, 09/04/2018  ? Zoster, Live 06/30/2007  ? ?Health Maintenance Due  ?Topic Date Due  ? COVID-19 Vaccine (3 - Moderna risk series) 11/16/2020  ? ? ?Last Pap smear:aged out  ?Last mammogram: 12/01/20 ?Last colonoscopy: 04/11/18 Dr. Oletta Lamas ?Last DEXA: 11/03/19 ?Dentist: Q six months  ?Ophtho: Q year ?Exercise: silver sneakers and zmuba  3 days a week  ? ?Other doctors caring for patient include: Dr. Lynden Ang GI ?           Dr. Ernst Bowler allergy  ?            ? ?Advanced directives: ?Does Patient Have a Medical Advance Directive?: Yes ?Type of Advance Directive: Living will ?Does patient want to make  changes to medical advance directive?: No - Patient declined ? ?Depression screen:  See questionnaire below.  ? ?  10/23/2021  ?  9:33 AM 10/14/2020  ?  9:56 AM 09/21/2019  ?  9:13 AM 09/18/2018  ?  2:05 PM 05/17/2016  ?  1:49 PM  ?Depression screen PHQ 2/9  ?Decreased Interest 0 0 0 0 0  ?Down, Depressed, Hopeless 0 0 0 0 0  ?PHQ - 2 Score 0 0 0 0 0  ? ? ?Fall Risk Screen: see questionnaire below. ? ?  10/23/2021  ?  9:30 AM 10/14/2020  ?  9:56 AM 09/21/2019  ?  9:13 AM 09/18/2018  ?  2:03 PM 05/17/2016  ?  1:49 PM  ?Fall Risk   ?Falls in the past year? 0 0 0 0 No  ?Number falls in past yr: 0 0     ?Injury with Fall? 0 0     ?Risk for fall due to : No Fall Risks No Fall Risks     ?Follow up Falls evaluation completed Falls evaluation completed     ? ? ?ADL screen:  See questionnaire below ?Functional Status Survey: ?Is the patient deaf or have difficulty hearing?: No ?Does the patient have difficulty seeing, even when wearing glasses/contacts?: No ?Does the patient have difficulty concentrating, remembering, or making decisions?: No ?Does the patient have difficulty walking or climbing stairs?: No ?Does the patient have difficulty dressing or bathing?: No ?Does the patient have difficulty doing errands alone such as visiting a doctor's office or shopping?:  No ? ? ?Review of Systems ?Constitutional: -, -unexpected weight change, -anorexia, -fatigue ?Allergy: -sneezing, -itching, -congestion ?Dermatology: denies changing moles, rash, lumps ?ENT: -runny nose, -ear pain, -sore throat,  ?Cardiology:  -chest pain, -palpitations, -orthopnea, ?Respiratory: -cough, -shortness of breath, -dyspnea on exertion, -wheezing,  ?Gastroenterology: -abdominal pain, -nausea, -vomiting, -diarrhea, -constipation, -dysphagia ?Hematology: -bleeding or bruising problems ?Musculoskeletal: -arthralgias, -myalgias, -joint swelling, -back pain, - ?Ophthalmology: -vision changes,  ?Urology: -dysuria, -difficulty urinating,  -urinary frequency, -urgency,  incontinence ?Neurology: -, -numbness, , -memory loss, -falls, -dizziness ? ? ? ?PHYSICAL EXAM: ? ?General Appearance: Alert, cooperative, no distress, appears stated age ?Head: Normocephalic, without obvious abnormality, atraumatic ?Eyes: PERRL, conjunctiva/corneas clear, EOM's intact,  ?Ears: Normal TM's and external ear canals ?Nose: Nares normal, mucosa normal, no drainage or sinus tenderness ?Throat: Lips, mucosa, and tongue normal; teeth and gums normal ?Neck: Supple, no lymphadenopathy;  thyroid:  no enlargement/tenderness/nodules; no carotid bruit or JVD ?Lungs: Clear to auscultation bilaterally without wheezes, rales or ronchi; respirations unlabored ?Heart: Regular rate and rhythm, S1 and S2 normal, no murmur, rubor gallop ?Abdomen: Soft, non-tender, nondistended, normoactive bowel sounds,  ?no masses, no hepatosplenomegaly ?Extremities: No clubbing, cyanosis or edema ?Pulses: 2+ and symmetric all extremities ?Skin:  Skin color, texture, turgor normal, no rashes or lesions ?Lymph nodes: Cervical, supraclavicular, and axillary nodes normal ?Neurologic:  CNII-XII intact, normal strength, sensation and gait; reflexes 2+ and symmetric throughout ?Psych: Normal mood, affect, hygiene and grooming. ? ?ASSESSMENT/PLAN: ?Routine general medical examination at a health care facility ? ?Allergic rhinitis due to pollen, unspecified seasonality ? ?Other specified hypothyroidism - Plan: TSH, levothyroxine (SYNTHROID) 100 MCG tablet ? ?Chronic idiopathic urticaria - Plan: CBC with Differential/Platelet, Comprehensive metabolic panel ? ?Osteopenia, unspecified location - Plan: DG Bone Density ? ?Status post cataract extraction, unspecified laterality ? ?Urge incontinence - Plan: Ambulatory referral to Gynecology ? ?Aortic atherosclerosis (HCC) - Plan: Lipid panel, atorvastatin (LIPITOR) 20 MG tablet ? ?Need for COVID-19 vaccine - Plan: Pension scheme manager ?Recommend she stop aspirin  dosing. ? ? ?Discussed yearly mammograms; at least 30 minutes of aerobic activity at least 5 days/week and weight-bearing exercise 2x/week; reviewed; healthy diet, including goals of calcium and vitamin D intake   Immunization recommendations discussed.  Colonoscopy recommendations reviewed ? ? ?Medicare Attestation ?I have personally reviewed: ?The patient's medical and social history ?Their use of alcohol, tobacco or illicit drugs ?Their current medications and supplements ?The patient's functional ability including ADLs,fall risks, home safety risks, cognitive, and hearing and visual impairment ?Diet and physical activities ?Evidence for depression or mood disorders ? ?The patient's weight, height, and BMI have been recorded in the chart.  I have made referrals, counseling, and provided education to the patient based on review of the above and I have provided the patient with a written personalized care plan for preventive services.   ? ? ?Jill Alexanders, MD   10/23/2021   ?

## 2021-10-24 LAB — CBC WITH DIFFERENTIAL/PLATELET
Basophils Absolute: 0 10*3/uL (ref 0.0–0.2)
Basos: 0 %
EOS (ABSOLUTE): 0.4 10*3/uL (ref 0.0–0.4)
Eos: 7 %
Hematocrit: 42.3 % (ref 34.0–46.6)
Hemoglobin: 14.4 g/dL (ref 11.1–15.9)
Immature Grans (Abs): 0 10*3/uL (ref 0.0–0.1)
Immature Granulocytes: 0 %
Lymphocytes Absolute: 1.6 10*3/uL (ref 0.7–3.1)
Lymphs: 29 %
MCH: 31.4 pg (ref 26.6–33.0)
MCHC: 34 g/dL (ref 31.5–35.7)
MCV: 92 fL (ref 79–97)
Monocytes Absolute: 0.6 10*3/uL (ref 0.1–0.9)
Monocytes: 10 %
Neutrophils Absolute: 3.1 10*3/uL (ref 1.4–7.0)
Neutrophils: 54 %
Platelets: 246 10*3/uL (ref 150–450)
RBC: 4.59 x10E6/uL (ref 3.77–5.28)
RDW: 13.3 % (ref 11.7–15.4)
WBC: 5.7 10*3/uL (ref 3.4–10.8)

## 2021-10-24 LAB — COMPREHENSIVE METABOLIC PANEL
ALT: 24 IU/L (ref 0–32)
AST: 30 IU/L (ref 0–40)
Albumin/Globulin Ratio: 1.8 (ref 1.2–2.2)
Albumin: 4.8 g/dL — ABNORMAL HIGH (ref 3.7–4.7)
Alkaline Phosphatase: 89 IU/L (ref 44–121)
BUN/Creatinine Ratio: 19 (ref 12–28)
BUN: 13 mg/dL (ref 8–27)
Bilirubin Total: 0.6 mg/dL (ref 0.0–1.2)
CO2: 24 mmol/L (ref 20–29)
Calcium: 10 mg/dL (ref 8.7–10.3)
Chloride: 103 mmol/L (ref 96–106)
Creatinine, Ser: 0.67 mg/dL (ref 0.57–1.00)
Globulin, Total: 2.6 g/dL (ref 1.5–4.5)
Glucose: 92 mg/dL (ref 70–99)
Potassium: 4.6 mmol/L (ref 3.5–5.2)
Sodium: 140 mmol/L (ref 134–144)
Total Protein: 7.4 g/dL (ref 6.0–8.5)
eGFR: 92 mL/min/{1.73_m2} (ref >59)

## 2021-10-24 LAB — LIPID PANEL
Chol/HDL Ratio: 2.1 ratio (ref 0.0–4.4)
Cholesterol, Total: 155 mg/dL (ref 100–199)
HDL: 73 mg/dL (ref >39)
LDL Chol Calc (NIH): 63 mg/dL (ref 0–99)
Triglycerides: 104 mg/dL (ref 0–149)
VLDL Cholesterol Cal: 19 mg/dL (ref 5–40)

## 2021-10-24 LAB — TSH: TSH: 2.31 u[IU]/mL (ref 0.450–4.500)

## 2021-10-25 ENCOUNTER — Ambulatory Visit (INDEPENDENT_AMBULATORY_CARE_PROVIDER_SITE_OTHER): Payer: Medicare Other

## 2021-10-25 DIAGNOSIS — L501 Idiopathic urticaria: Secondary | ICD-10-CM | POA: Diagnosis not present

## 2021-11-03 ENCOUNTER — Encounter: Payer: Self-pay | Admitting: Obstetrics & Gynecology

## 2021-11-03 ENCOUNTER — Ambulatory Visit: Payer: Medicare Other | Admitting: Obstetrics & Gynecology

## 2021-11-03 VITALS — BP 110/78 | HR 74 | Resp 16 | Ht 61.75 in | Wt 144.0 lb

## 2021-11-03 DIAGNOSIS — R3915 Urgency of urination: Secondary | ICD-10-CM

## 2021-11-03 DIAGNOSIS — N3941 Urge incontinence: Secondary | ICD-10-CM | POA: Diagnosis not present

## 2021-11-03 DIAGNOSIS — N8110 Cystocele, unspecified: Secondary | ICD-10-CM | POA: Diagnosis not present

## 2021-11-03 NOTE — Progress Notes (Signed)
? ? ?  [name] 1946-01-18 [id number] ? ? ?     76 y.o.  G3P3003  ? ?RP:  Urinary urgency & occ trouble emptying bladder completely x 1 year ? ?HPI: Pt c/o of urinary urgency & occ trouble emptying bladder completely x about a year.  Drinks green tea a lot.  Feeling a small vaginal bulge when standing in the shower. No vaginal pain.  No vaginal discharge or Postmenopausal bleeding.  BMs normal.  No fever. ? ? ?OB History  ?Gravida Para Term Preterm AB Living  ?'3 3 3     3  '$ ?SAB IAB Ectopic Multiple Live Births  ?           ?  ?# Outcome Date GA Lbr Len/2nd Weight Sex Delivery Anes PTL Lv  ?3 Term           ?2 Term           ?1 Term           ? ? ?Past medical history,surgical history, problem list, medications, allergies, family history and social history were all reviewed and documented in the EPIC chart. ? ? ?Directed ROS with pertinent positives and negatives documented in the history of present illness/assessment and plan. ? ?Exam: ? ?Vitals:  ? 11/03/21 0921  ?BP: 110/78  ?Pulse: 74  ?Resp: 16  ?Weight: 144 lb (65.3 kg)  ?Height: 5' 1.75" (1.568 m)  ? ?General appearance:  Normal ? ?CVAT Neg bilaterally ? ?Abdomen: Soft, NT ? ?Gynecologic exam: Vulva normal.  Bimanual exam: Uterus anteverted, normal volume, mobile.  No adnexal mass, nontender bilaterally.  Exam done with Valsalva: Cystocele grade 2/4.  No uterine prolapse.  No rectocele. ? ?U/A: Dark yellow, clear, protein negative, nitrites negative, white blood cells 6-10, red blood cells negative, bacteria few.  Urine culture pending. ? ? ?Assessment/Plan:  76 y.o. G3P3003  ? ?1. Urgency of urination ?Pt c/o of urinary urgency & occ trouble emptying bladder completely x about a year.  Drinks green tea a lot.  Feeling a small vaginal bulge when standing in the shower. No vaginal pain.  No vaginal discharge or Postmenopausal bleeding.  BMs normal.  No fever.  Gynecologic exam normal except for a Cystocele grade 2/4.  U/A mildly perturbed, will wait on U.  Culture to decide if an Antibiotic treatment is indicated. ? ?2. Urge incontinence ?Recommend emptying her bladder before it over fills.  Will stop bladder irritants such as her green tea and replace by drinking water.  May also use a protection at night given that the mild incontinence usually occurs at that time.  Kegel exercises instructed and recommended.  Will refer to physical therapy as needed. ?- Urinalysis,Complete w/RFL Culture ? ?3. Baden-Walker grade 2 cystocele ?Cystocele grade 2/4.  Counseling and management reviewed thoroughly with patient including avoidance of pelvic floor pressure.  Will avoid heavy lifting and contract in rather than push, will treat constipation and avoid pushing for bowel movements.  Will empty her bladder before it over fills.  Recommend Kegel exercises.  Instruction for [employer]  given.  If not sufficient, will refer to physical therapy. ? ?Other orders ?- CALCIUM PO; Take 1,000 mg by mouth. gummy ?- BIOTIN PO; Take 5,000 mg by mouth. ?- Urine Culture ?- REFLEXIVE URINE CULTURE  ? ?Counseling, management and recommendations for urgency of urination and incontinence as well as for cystocele for 35 minutes. ? ?Princess Bruins MD, 9:41 AM 11/03/2021 ? ? ? ?  ?

## 2021-11-05 LAB — URINE CULTURE
MICRO NUMBER:: 13295643
SPECIMEN QUALITY:: ADEQUATE

## 2021-11-05 LAB — URINALYSIS, COMPLETE W/RFL CULTURE
Bilirubin Urine: NEGATIVE
Casts: NONE SEEN /LPF
Crystals: NONE SEEN /HPF
Glucose, UA: NEGATIVE
Hgb urine dipstick: NEGATIVE
Hyaline Cast: NONE SEEN /LPF
Nitrites, Initial: NEGATIVE
Protein, ur: NEGATIVE
RBC / HPF: NONE SEEN /HPF (ref 0–2)
Specific Gravity, Urine: 1.025 (ref 1.001–1.035)
Yeast: NONE SEEN /HPF
pH: 6.5 (ref 5.0–8.0)

## 2021-11-05 LAB — CULTURE INDICATED

## 2021-11-09 ENCOUNTER — Other Ambulatory Visit: Payer: Self-pay

## 2021-11-09 ENCOUNTER — Telehealth: Payer: Self-pay

## 2021-11-09 DIAGNOSIS — A491 Streptococcal infection, unspecified site: Secondary | ICD-10-CM

## 2021-11-09 MED ORDER — AMOXICILLIN 500 MG PO CAPS: 500.0000 mg | ORAL_CAPSULE | Freq: Three times a day (TID) | ORAL | 0 refills | Status: DC

## 2021-11-09 MED ORDER — AMOXICILLIN 500 MG PO TABS: 500.0000 mg | ORAL_TABLET | Freq: Two times a day (BID) | ORAL | 0 refills | Status: DC

## 2021-11-09 NOTE — Telephone Encounter (Signed)
Pt returning call re: urine culture results and medication Rxd. Reports that if she remembers correctly penicillins were not tolerated well in the past even though not on allergy list. Pt also reports hard times swallowing capsules so if we could Rx something in tablet form would be preferred. Please advise.  ?

## 2021-11-10 ENCOUNTER — Other Ambulatory Visit: Payer: Self-pay | Admitting: Radiology

## 2021-11-10 MED ORDER — AMOXICILLIN-POT CLAVULANATE 875-125 MG PO TABS: 1.0000 | ORAL_TABLET | Freq: Two times a day (BID) | ORAL | 0 refills | Status: DC

## 2021-11-10 NOTE — Telephone Encounter (Signed)
Pt calling to inquire about this. Will forward to other provider. Please advise.  ?

## 2021-11-10 NOTE — Telephone Encounter (Signed)
Pt is not really sure if it was her or her husband who had a bad reaction to penicillins in the past. Her main concern is taking anything in capsule form. Is willing to try amoxicillin/augmentin if Rxd in tablet form. Please advise.  ?

## 2021-11-10 NOTE — Telephone Encounter (Signed)
I will send over augmentin for her now.

## 2021-11-10 NOTE — Telephone Encounter (Signed)
We are limited what comes in tablet form- she has an allergy to levaquin so can't do cipro and augmentin has amoxicillin in it. What was her reaction to PCN?

## 2021-11-10 NOTE — Telephone Encounter (Signed)
Pt notified and voiced understanding 

## 2021-11-13 ENCOUNTER — Telehealth: Payer: Self-pay

## 2021-11-13 NOTE — Telephone Encounter (Signed)
Patient was prescribed Augmentin 875-125 mg tabs for UTI on 11/10/21.  She called today to report she cannot take this medication. Had several episodes of "explosive diarrhea"  yesterday so she stopped taking it.  ?

## 2021-11-13 NOTE — Telephone Encounter (Signed)
Princess Bruins, MD  You 33 minutes ago (3:45 PM)  ? ?I initially prescribed Amoxyl, what was the problem with that ABTX?   ? ? ? ?From the notes it appears that it was a capsule was the problem.  She cannot take a capsule and needs a tablet. ?

## 2021-11-14 ENCOUNTER — Other Ambulatory Visit: Payer: Self-pay

## 2021-11-14 MED ORDER — SULFAMETHOXAZOLE-TRIMETHOPRIM 800-160 MG PO TABS: 1.0000 | ORAL_TABLET | Freq: Two times a day (BID) | ORAL | 0 refills | Status: DC

## 2021-11-14 NOTE — Telephone Encounter (Signed)
Cathy Bruins, MD  You 2 hours ago (6:30 AM)  ? ?Please send a prescription for Amoxyl tablet 500 mg PO TID x 5 days.   ? ? ?I spoke with patient. She is very concerned about taking Amoxicllin after the Augmentin gave her such "horrible diarrhea".  She asked is there something "milder on the system" even if I have to take it a longer period of time? ?

## 2021-11-14 NOTE — Telephone Encounter (Signed)
The Bacteria is most sensitive to Penicillin family of ABTx, but can try MacroBID 1 tab PO BID x 5 days.  ? ? ?Dr. Marguerita Merles- Macrobid Is only capsules.  Per patient she can only take a tablet Please advise. ?

## 2021-11-14 NOTE — Telephone Encounter (Signed)
? ?  Cathy Bruins, MD  You 1 minute ago (4:44 PM)  ? ?Bactrim DS 1 tab PO BID x 3 days.   ? ? ?I called patient and informed her. Reviewed take with food. Rx sent. ?

## 2021-11-15 ENCOUNTER — Ambulatory Visit (INDEPENDENT_AMBULATORY_CARE_PROVIDER_SITE_OTHER): Payer: Medicare Other

## 2021-11-15 DIAGNOSIS — L501 Idiopathic urticaria: Secondary | ICD-10-CM

## 2021-11-22 ENCOUNTER — Telehealth: Payer: Self-pay | Admitting: Family Medicine

## 2021-11-22 NOTE — Telephone Encounter (Signed)
Pt came in and dropped off a copy of advance directives. Sending back to [place].  ?

## 2021-12-06 ENCOUNTER — Ambulatory Visit: Payer: Medicare Other

## 2021-12-06 ENCOUNTER — Ambulatory Visit (INDEPENDENT_AMBULATORY_CARE_PROVIDER_SITE_OTHER): Payer: Medicare Other

## 2021-12-06 DIAGNOSIS — L501 Idiopathic urticaria: Secondary | ICD-10-CM

## 2021-12-08 ENCOUNTER — Ambulatory Visit
Admission: RE | Admit: 2021-12-08 | Discharge: 2021-12-08 | Disposition: A | Discharge status: Home / Self Care | Payer: Medicare Other | Source: Ambulatory Visit | Attending: Family Medicine | Admitting: Family Medicine

## 2021-12-08 DIAGNOSIS — Z1231 Encounter for screening mammogram for malignant neoplasm of breast: Secondary | ICD-10-CM

## 2021-12-27 ENCOUNTER — Ambulatory Visit: Payer: Medicare Other

## 2021-12-28 ENCOUNTER — Ambulatory Visit (INDEPENDENT_AMBULATORY_CARE_PROVIDER_SITE_OTHER): Payer: Medicare Other | Admitting: Medical

## 2021-12-28 VITALS — BP 120/80 | HR 94 | Temp 98.2°F | Wt 143.0 lb

## 2021-12-28 DIAGNOSIS — R0602 Shortness of breath: Secondary | ICD-10-CM

## 2021-12-28 DIAGNOSIS — R051 Acute cough: Secondary | ICD-10-CM

## 2021-12-28 DIAGNOSIS — J988 Other specified respiratory disorders: Secondary | ICD-10-CM | POA: Diagnosis not present

## 2021-12-28 MED ORDER — AZITHROMYCIN 250 MG PO TABS: ORAL_TABLET | ORAL | 0 refills | Status: DC

## 2021-12-28 MED ORDER — ALBUTEROL SULFATE HFA 108 (90 BASE) MCG/ACT IN AERS
2.0000 | INHALATION_SPRAY | Freq: Four times a day (QID) | RESPIRATORY_TRACT | 0 refills | Status: DC | PRN
Start: 2021-12-28 — End: 2022-01-22

## 2021-12-28 MED ORDER — PROMETHAZINE-DM 6.25-15 MG/5ML PO SYRP: 5.0000 mL | ORAL_SOLUTION | Freq: Four times a day (QID) | ORAL | 0 refills | Status: DC | PRN

## 2021-12-28 NOTE — Patient Instructions (Signed)
Symptoms suggest respiratory tract infection and some tightness in the chest  Recommendations Be good about water intake at least 100 ounces of water daily particularly over the weekend You can use over-the-counter Mucinex or guaifenesin to help get up mucus Begin Z-Pak antibiotic as listed on the instructions Begin albuterol inhaler 1 to 2 puffs 3 times a day for the next few days to help with coughing fits, shortness of breath and tightness in the chest You can use the Promethazine DM cough syrup as needed If not much improved or if worse over the weekend then call or recheck

## 2021-12-28 NOTE — Progress Notes (Signed)
Subjective:  [name] is a 76 y.o. female who presents for Chief Complaint  Patient presents with   sick- last 2 weeks    Sick for the last 2 weeks. Congestion, cough, no energy, coughing so much she might throw up     She notes 2 week hx/o cough, congestion, no energy.  No fever.   Has some body aches, chills.  Has had some loose stool, decreased appetite.  1 episode of nausea & vomiting after coughing.   Coughing spells yesterday.  Right ear seemed itchy and red, hurt for 3 days.  Using OTC cough remedies.    Water intake.  No wheezing, maybe mild sob.   No sick contacts.  Lives alone.  Nonsmoker, no hx/o asthma.  No other aggravating or relieving factors.    No other c/o.  Past Medical History:  Diagnosis Date   Allergy    RHINITIS   Osteoporosis    OSTEOPENIA   Thyroid disease    HYPOTHYROID   Urticaria    Vitiligo    Current Outpatient Medications on File Prior to Visit  Medication Sig Dispense Refill   aspirin 81 MG tablet Take 81 mg by mouth daily.     atorvastatin (LIPITOR) 20 MG tablet TAKE 1 TABLET(20 MG) BY MOUTH DAILY 90 tablet 3   BIOTIN PO Take 5,000 mg by mouth.     CALCIUM PO Take 1,000 mg by mouth. gummy     ELDERBERRY PO Take by mouth. Takes 2     fexofenadine (ALLEGRA) 180 MG tablet Take 1 tablet by mouth in the morning and at bedtime.     levothyroxine (SYNTHROID) 100 MCG tablet TAKE 1 TABLET(100 MCG) BY MOUTH DAILY 90 tablet 3   Multiple Vitamins-Minerals (MULTIVITAMIN WITH MINERALS) tablet Take 2 tablets by mouth daily.     omalizumab Arvid Right) 150 MG/ML prefilled syringe Inject 300 mg into the skin every 14 (fourteen) days. 4 mL 11   EPINEPHrine 0.3 mg/0.3 mL IJ SOAJ injection Inject 0.3 mg into the muscle as needed for anaphylaxis. 2 each 1   Current Facility-Administered Medications on File Prior to Visit  Medication Dose Route Frequency Provider Last Rate Last Admin   omalizumab Arvid Right) prefilled syringe 300 mg  300 mg Subcutaneous Q21 days [name], FNP   300 mg at 12/06/21 1420     The following portions of the patient's history were reviewed and updated as appropriate: allergies, current medications, past family history, past medical history, past social history, past surgical history and problem list.  ROS Otherwise as in subjective above  Objective: BP 120/80   Pulse 94   Temp 98.2 F (36.8 C)   Wt 143 lb (64.9 kg)   SpO2 96%   BMI 26.37 kg/m   General appearance: alert, no distress, well developed, well nourished HEENT: normocephalic, sclerae anicteric, conjunctiva pink and moist, TMs flat, nares patent, no discharge or erythema, pharynx normal Oral cavity: MMM, no lesions Neck: supple, no lymphadenopathy, no thyromegaly, no masses, no JVD Heart: RRR, normal S1, S2, no murmurs Lungs: lower fields decreased sounds/tight sounding but otherwise  no wheezes, rhonchi, or rales Pulses: 2+ radial pulses, 2+ pedal pulses, normal cap refill Ext: no edema   Assessment: Encounter Diagnoses  Name Primary?   Respiratory tract infection Yes   Acute cough    SOB (shortness of breath)      Plan: Symptoms suggest respiratory tract infection and some tightness in the chest  Recommendations Be good about water  intake at least 100 ounces of water daily particularly over the weekend You can use over-the-counter Mucinex or guaifenesin to help get up mucus Begin Z-Pak antibiotic as listed on the instructions Begin albuterol inhaler 1 to 2 puffs 3 times a day for the next few days to help with coughing fits, shortness of breath and tightness in the chest You can use the Promethazine DM cough syrup as needed If not much improved or if worse over the weekend then call or recheck   Tyrika was seen today for sick- last 2 weeks.  Diagnoses and all orders for this visit:  Respiratory tract infection  Acute cough  SOB (shortness of breath)  Other orders -     azithromycin (ZITHROMAX) 250 MG tablet; 2 tablets day 1,  then 1 tablet days 2-4 -     albuterol (VENTOLIN HFA) 108 (90 Base) MCG/ACT inhaler; Inhale 2 puffs into the lungs every 6 (six) hours as needed for wheezing or shortness of breath. -     promethazine-dextromethorphan (PROMETHAZINE-DM) 6.25-15 MG/5ML syrup; Take 5 mLs by mouth 4 (four) times daily as needed for cough.    Follow up: prn

## 2021-12-29 ENCOUNTER — Ambulatory Visit (INDEPENDENT_AMBULATORY_CARE_PROVIDER_SITE_OTHER): Payer: Medicare Other

## 2021-12-29 DIAGNOSIS — L501 Idiopathic urticaria: Secondary | ICD-10-CM

## 2022-01-05 ENCOUNTER — Other Ambulatory Visit: Payer: Self-pay | Admitting: Family Medicine

## 2022-01-05 DIAGNOSIS — I700 Atherosclerosis of aorta: Secondary | ICD-10-CM

## 2022-01-17 ENCOUNTER — Ambulatory Visit (INDEPENDENT_AMBULATORY_CARE_PROVIDER_SITE_OTHER): Payer: Medicare Other

## 2022-01-17 DIAGNOSIS — L501 Idiopathic urticaria: Secondary | ICD-10-CM

## 2022-01-19 ENCOUNTER — Other Ambulatory Visit: Payer: Self-pay | Admitting: Medical

## 2022-01-22 NOTE — Telephone Encounter (Signed)
[facility] is requesting to fill pt ventolin. Please advise [facility]

## 2022-02-07 ENCOUNTER — Ambulatory Visit: Payer: Medicare Other

## 2022-02-07 ENCOUNTER — Ambulatory Visit (INDEPENDENT_AMBULATORY_CARE_PROVIDER_SITE_OTHER): Payer: Medicare Other | Admitting: Family

## 2022-02-07 ENCOUNTER — Encounter: Payer: Self-pay | Admitting: Allergy & Immunology

## 2022-02-07 VITALS — BP 130/72 | HR 64 | Temp 98.4°F | Resp 16 | Ht 61.5 in | Wt 142.4 lb

## 2022-02-07 DIAGNOSIS — Z91013 Allergy to seafood: Secondary | ICD-10-CM

## 2022-02-07 DIAGNOSIS — L501 Idiopathic urticaria: Secondary | ICD-10-CM

## 2022-02-07 MED ORDER — EPINEPHRINE 0.3 MG/0.3ML IJ SOAJ: 0.3000 mg | INTRAMUSCULAR | 1 refills | Status: DC | PRN

## 2022-02-07 NOTE — Progress Notes (Signed)
[place], [address] [place] [address] Dept: [phone]  FOLLOW UP NOTE  Patient ID: [name], female    DOB: 05-03-1946  Age: 76 y.o. 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  She was last seen on October 11, 2021 by Dr. Ernst Bowler.  Since her last office visit she denies any new diagnosis or surgeries.  Chronic idiopathic urticaria is reported as doing well with Xolair injections every 3 weeks and Allegra half a tablet once a day.  She reports that she has not had any hives or tongue swelling in a year or more.  She is interested in spacing out her Xolair injections.  She denies any problems or reactions with her Xolair injections.  She does feel like her Xolair injections have helped.  She continues to avoid shellfish without any accidental ingestion or use of her epinephrine autoinjector device.   Drug Allergies:  Allergies  Allergen Reactions   Levofloxacin    Shellfish Allergy Anaphylaxis    Review of Systems: Review of Systems  Constitutional:  Negative for chills and fever.  HENT:         Denies rhinorrhea, nasal congestion,and post nasal drip  Eyes:        Denies itchy watery eyes  Respiratory:  Negative for cough, shortness of breath and wheezing.   Cardiovascular:  Negative for chest pain and palpitations.  Gastrointestinal:        Reports periodic indigestion with certain foods. Will take something like Tums  Genitourinary:  Negative for frequency.  Skin:  Negative for itching and rash.  Neurological:  Negative for headaches.     Physical Exam: BP 130/72   Pulse 64   Temp 98.4 F (36.9 C)   Resp 16   Ht 5' 1.5" (1.562 m)   Wt 142 lb 6 oz (64.6 kg)   SpO2 97%   BMI 26.47 kg/m    Physical Exam Constitutional:      Appearance:  Normal appearance.  HENT:     Head: Normocephalic and atraumatic.     Comments: Pharynx normal, eyes normal, ears normal, nose normal    Right Ear: Tympanic membrane, ear canal and external ear normal.     Left Ear: Tympanic membrane, ear canal and external ear normal.     Nose: Nose normal.     Mouth/Throat:     Mouth: Mucous membranes are moist.     Pharynx: Oropharynx is clear.  Eyes:     Conjunctiva/sclera: Conjunctivae normal.  Cardiovascular:     Rate and Rhythm: Regular rhythm.     Heart sounds: Normal heart sounds.  Pulmonary:     Effort: Pulmonary effort is normal.     Breath sounds: Normal breath sounds.     Comments: Lungs clear to auscultation Musculoskeletal:     Cervical back: Neck supple.  Skin:    General: Skin is warm.     Comments: No rashes or urticarial lesions noted  Neurological:     Mental Status: She is alert and oriented to person, place, and time.  Psychiatric:        Mood and Affect: Mood normal.        Behavior: Behavior normal.        Thought Content: Thought content normal.        Judgment:  Judgment normal.     Diagnostics:  None  Assessment and Plan: 1. Chronic idiopathic urticaria   2. Shellfish allergy     Meds ordered this encounter  Medications   EPINEPHrine 0.3 mg/0.3 mL IJ SOAJ injection    Sig: Inject 0.3 mg into the muscle as needed for anaphylaxis.    Dispense:  2 each    Refill:  1    Patient Instructions  1. Chronic urticaria with recurrent anaphylaxis episodes -  continue Allegra taking half a tablet once a day - Space out Xolair  to every FOUR weeks. - We will continue to space your Xolair out as you continue to do well  2. Schedule a follow up appointment in 4-6 months   Return in about 6 months (around 08/10/2022), or if symptoms worsen or fail to improve.    Thank you for the opportunity to care for this patient.  Please do not hesitate to contact me with questions.  [name], FNP [facility] [place]

## 2022-02-07 NOTE — Patient Instructions (Addendum)
1. Chronic urticaria with recurrent anaphylaxis episodes -  continue Allegra taking half a tablet once a day - Space out Xolair  to every FOUR weeks. - We will continue to space your Xolair out as you continue to do well  2. Shellfish allergy Avoid shellfish. In case of an allergic reaction, give Benadryl 4 teaspoonfuls every 4 hours, and if life-threatening symptoms occur, inject with EpiPen 0.3 mg. Refill for EpiPen sent  Schedule a follow up appointment in 4-6 months

## 2022-02-09 NOTE — Progress Notes (Signed)
Noted in chart.

## 2022-02-26 ENCOUNTER — Ambulatory Visit: Payer: Medicare Other

## 2022-03-05 ENCOUNTER — Ambulatory Visit (INDEPENDENT_AMBULATORY_CARE_PROVIDER_SITE_OTHER): Payer: Medicare Other

## 2022-03-05 DIAGNOSIS — L501 Idiopathic urticaria: Secondary | ICD-10-CM

## 2022-03-14 DIAGNOSIS — Z79899 Other long term (current) drug therapy: Secondary | ICD-10-CM | POA: Diagnosis not present

## 2022-03-14 DIAGNOSIS — H35363 Drusen (degenerative) of macula, bilateral: Secondary | ICD-10-CM | POA: Diagnosis not present

## 2022-03-14 DIAGNOSIS — Z961 Presence of intraocular lens: Secondary | ICD-10-CM | POA: Diagnosis not present

## 2022-03-21 ENCOUNTER — Encounter: Payer: Self-pay | Admitting: Internal Medicine

## 2022-03-26 ENCOUNTER — Ambulatory Visit (INDEPENDENT_AMBULATORY_CARE_PROVIDER_SITE_OTHER): Payer: Medicare Other | Admitting: *Deleted

## 2022-03-26 DIAGNOSIS — L501 Idiopathic urticaria: Secondary | ICD-10-CM

## 2022-04-11 ENCOUNTER — Other Ambulatory Visit (INDEPENDENT_AMBULATORY_CARE_PROVIDER_SITE_OTHER): Payer: Medicare Other

## 2022-04-11 DIAGNOSIS — Z23 Encounter for immunization: Secondary | ICD-10-CM

## 2022-04-16 ENCOUNTER — Ambulatory Visit (INDEPENDENT_AMBULATORY_CARE_PROVIDER_SITE_OTHER): Payer: Medicare Other

## 2022-04-16 DIAGNOSIS — L501 Idiopathic urticaria: Secondary | ICD-10-CM

## 2022-04-18 ENCOUNTER — Ambulatory Visit
Admission: RE | Admit: 2022-04-18 | Discharge: 2022-04-18 | Disposition: A | Discharge status: Home / Self Care | Payer: Medicare Other | Source: Ambulatory Visit | Attending: Family Medicine | Admitting: Family Medicine

## 2022-04-18 DIAGNOSIS — M8589 Other specified disorders of bone density and structure, multiple sites: Secondary | ICD-10-CM | POA: Diagnosis not present

## 2022-04-18 DIAGNOSIS — Z780 Asymptomatic menopausal state: Secondary | ICD-10-CM | POA: Diagnosis not present

## 2022-04-24 ENCOUNTER — Encounter: Payer: Self-pay | Admitting: Internal Medicine

## 2022-05-07 ENCOUNTER — Ambulatory Visit (INDEPENDENT_AMBULATORY_CARE_PROVIDER_SITE_OTHER): Payer: Medicare Other | Admitting: *Deleted

## 2022-05-07 ENCOUNTER — Encounter: Payer: Self-pay | Admitting: Internal Medicine

## 2022-05-07 DIAGNOSIS — L501 Idiopathic urticaria: Secondary | ICD-10-CM | POA: Diagnosis not present

## 2022-05-28 ENCOUNTER — Ambulatory Visit (INDEPENDENT_AMBULATORY_CARE_PROVIDER_SITE_OTHER): Payer: Medicare Other

## 2022-05-28 DIAGNOSIS — L501 Idiopathic urticaria: Secondary | ICD-10-CM

## 2022-06-18 ENCOUNTER — Ambulatory Visit (INDEPENDENT_AMBULATORY_CARE_PROVIDER_SITE_OTHER): Payer: Medicare Other

## 2022-06-18 DIAGNOSIS — L501 Idiopathic urticaria: Secondary | ICD-10-CM | POA: Diagnosis not present

## 2022-07-05 ENCOUNTER — Other Ambulatory Visit: Payer: Self-pay | Admitting: *Deleted

## 2022-07-05 MED ORDER — OMALIZUMAB 150 MG/ML ~~LOC~~ SOSY: 300.0000 mg | PREFILLED_SYRINGE | SUBCUTANEOUS | 11 refills | Status: DC

## 2022-07-10 NOTE — Patient Instructions (Incomplete)
1. Chronic urticaria with recurrent anaphylaxis episodes -  continue Allegra taking half a tablet once a day - Space out Xolair  to every FOUR weeks. - We will continue to space your Xolair out as you continue to do well  2. Shellfish allergy Avoid shellfish. In case of an allergic reaction, give Benadryl 4 teaspoonfuls every 4 hours, and if life-threatening symptoms occur, inject with EpiPen 0.3 mg.  Schedule a follow up appointment in 4-6 months

## 2022-07-11 ENCOUNTER — Other Ambulatory Visit: Payer: Self-pay

## 2022-07-11 ENCOUNTER — Ambulatory Visit (INDEPENDENT_AMBULATORY_CARE_PROVIDER_SITE_OTHER): Payer: Medicare Other | Admitting: Family

## 2022-07-11 ENCOUNTER — Encounter: Payer: Self-pay | Admitting: Family

## 2022-07-11 ENCOUNTER — Ambulatory Visit: Payer: Medicare Other

## 2022-07-11 VITALS — BP 128/78 | HR 78 | Temp 98.0°F | Resp 16 | Ht 62.0 in | Wt 140.6 lb

## 2022-07-11 DIAGNOSIS — L501 Idiopathic urticaria: Secondary | ICD-10-CM

## 2022-07-11 DIAGNOSIS — R062 Wheezing: Secondary | ICD-10-CM

## 2022-07-11 DIAGNOSIS — Z91013 Allergy to seafood: Secondary | ICD-10-CM

## 2022-07-11 MED ORDER — EPINEPHRINE 0.3 MG/0.3ML IJ SOAJ: 0.3000 mg | INTRAMUSCULAR | 1 refills | Status: DC | PRN

## 2022-07-11 MED ORDER — ALBUTEROL SULFATE HFA 108 (90 BASE) MCG/ACT IN AERS: INHALATION_SPRAY | RESPIRATORY_TRACT | 1 refills | Status: DC

## 2022-07-11 NOTE — Progress Notes (Signed)
[place], [address] [place] [address] Dept: [phone]  FOLLOW UP NOTE  Patient ID: [name], female    DOB: May 24, 1946  Age: 76 y.o. MRN: [id number] Date of Office Visit: 07/11/2022  Assessment  Chief Complaint: Allergic Reaction and Chronic idiopathic urticaria (4-6 mth f/u - Fine - no more episodes; Patient would like to discuss reducing Allegra and Xolair if possible)  HPI [name] is a 76 year old female who presents today for follow-up of chronic idiopathic urticaria and shellfish allergy.  She was last seen on February 07, 2022 by myself.  She denies any new diagnosis or surgery since her last office visit.  Chronic idiopathic urticaria: She reports that she has not had any hives or tongue swelling since her last office visit.  She is currently receiving Xolair injections every 3 weeks.  She is not certain why she did not go out to every 4 weeks with her Xolair injections as recommended at her last office visit.  She continues to take half an Allegra tablet in the morning.  She denies any problems or reactions with her Xolair injections.  She does mention that she does not have an EpiPen.  She reports that her old insurance will not cover it, but her new insurance that goes into effect next week will.  New prescription for EpiPen was sent to [facility].  Shellfish allergy: She continues to avoid shellfish without any accidental ingestion or need for an epinephrine autoinjector device.  Wheeze: She reports that she does have something that sounds like a wheezing every once in a while.  She does not ever remember having an albuterol inhaler or being diagnosed with asthma.  She denies cough, shortness of breath, tightness in chest, and nocturnal awakenings due to breathing problems.  After reviewing epic it looks like she was prescribed an albuterol inhaler back in June 2023 for a respiratory tract infection.  She does not know where her albuterol inhaler  is   Drug Allergies:  Allergies  Allergen Reactions   Levofloxacin    Shellfish Allergy Anaphylaxis    Review of Systems: Review of Systems  Constitutional:  Negative for chills and fever.  HENT:         Denies rhinorrhea, nasal congestion, and postnasal drip  Eyes:        Denies itchy watery eyes  Respiratory:  Positive for wheezing. Negative for cough and shortness of breath.        Reports wheezing once in a while.  She does not ever remember having an albuterol inhaler being diagnosed with albuterol.  Denies cough, tightness in chest, shortness of breath, and nocturnal awakenings due to breathing problems  Cardiovascular:  Negative for chest pain and palpitations.  Gastrointestinal:        Denies heartburn or reflux symptoms  Genitourinary:  Negative for frequency.  Skin:  Negative for itching and rash.  Neurological:  Positive for headaches.       Reports headaches now and then     Physical Exam: BP 128/78   Pulse 78   Temp 98 F (36.7 C)   Resp 16   Ht '5\' 2"'$  (1.575 m)   Wt 140 lb 9.6 oz (63.8 kg)   SpO2 93%   BMI 25.72 kg/m    Physical Exam Constitutional:      Appearance: Normal appearance.  HENT:     Head: Normocephalic and atraumatic.     Comments: Pharynx normal. Eyes normal. Ears normal. Nose normal  Right Ear: Tympanic membrane, ear canal and external ear normal.     Left Ear: Tympanic membrane, ear canal and external ear normal.     Nose: Nose normal.     Mouth/Throat:     Mouth: Mucous membranes are moist.     Pharynx: Oropharynx is clear.  Eyes:     Conjunctiva/sclera: Conjunctivae normal.  Cardiovascular:     Rate and Rhythm: Normal rate and regular rhythm.     Heart sounds: Normal heart sounds.  Pulmonary:     Effort: Pulmonary effort is normal.     Breath sounds: Normal breath sounds.     Comments: Lungs clear to auscultation Musculoskeletal:     Cervical back: Neck supple.  Skin:    General: Skin is warm.     Comments: No hives or  urticarial lesions noted  Neurological:     Mental Status: She is alert and oriented to person, place, and time.  Psychiatric:        Mood and Affect: Mood normal.        Behavior: Behavior normal.        Thought Content: Thought content normal.        Judgment: Judgment normal.    Diagnostics: FVC 1.68 L (67%), FEV1 1.40 L (73%).  Predicted FVC 2.50 L, predicted FEV1 1.93 L.  Spirometry indicates possible mild restriction.  Poor effort noted  Assessment and Plan: 1. Chronic idiopathic urticaria   2. Shellfish allergy   3. Wheeze   4. Idiopathic urticaria     Meds ordered this encounter  Medications   EPINEPHrine 0.3 mg/0.3 mL IJ SOAJ injection    Sig: Inject 0.3 mg into the muscle as needed for anaphylaxis.    Dispense:  2 each    Refill:  1   albuterol (VENTOLIN HFA) 108 (90 Base) MCG/ACT inhaler    Sig: Inhale 2 puffs every 4-6 hours as needed for cough, wheeze, tightness in chest, or shortness of breath    Dispense:  8 g    Refill:  1    Patient Instructions  1. Chronic urticaria with recurrent anaphylaxis episodes -  continue Allegra taking half a tablet once a day - Space out Xolair  to every FOUR weeks.  Xolair given today while in the office.  Discussed the risk of anaphylaxis with Xolair injections. - We will continue to space your Xolair out as you continue to do well  2. Shellfish allergy Avoid shellfish. In case of an allergic reaction, give Benadryl 4 teaspoonfuls every 4 hours, and if life-threatening symptoms occur, inject with EpiPen 0.3 mg.  New prescription of your EpiPen sent to [facility].  Please make sure that you pick this up from the pharmacy.  3. Wheeze It looks like you had troubles completing spirometry today.  We will continue to follow your symptoms. We will send in a prescription for albuterol to use as needed. May use albuterol 2 puffs every 4-6 hours as needed for cough, wheeze, tightness in chest, or shortness of breath  Schedule  a follow up appointment in 4-6 months  Return in about 6 months (around 01/10/2023), or if symptoms worsen or fail to improve.    Thank you for the opportunity to care for this patient.  Please do not hesitate to contact me with questions.  [name], FNP [facility] [place]

## 2022-07-11 NOTE — Addendum Note (Signed)
Addended by: Tommas Olp B on: 07/11/2022 02:27 PM   Modules accepted: Orders

## 2022-07-23 MED ORDER — EPINEPHRINE 0.3 MG/0.3ML IJ SOAJ: 0.3000 mg | INTRAMUSCULAR | 1 refills | Status: DC | PRN

## 2022-07-23 MED ORDER — ALBUTEROL SULFATE HFA 108 (90 BASE) MCG/ACT IN AERS: 2.0000 | INHALATION_SPRAY | RESPIRATORY_TRACT | 1 refills | Status: DC | PRN

## 2022-07-23 MED ORDER — FEXOFENADINE HCL 180 MG PO TABS: 180.0000 mg | ORAL_TABLET | Freq: Every day | ORAL | 5 refills | Status: AC | PRN

## 2022-07-23 NOTE — Addendum Note (Signed)
Addended by: Clovis Cao A on: 07/23/2022 10:40 AM   Modules accepted: Orders

## 2022-08-06 ENCOUNTER — Telehealth: Payer: Self-pay | Admitting: Allergy & Immunology

## 2022-08-06 ENCOUNTER — Ambulatory Visit (INDEPENDENT_AMBULATORY_CARE_PROVIDER_SITE_OTHER): Payer: HMO

## 2022-08-06 DIAGNOSIS — L501 Idiopathic urticaria: Secondary | ICD-10-CM | POA: Diagnosis not present

## 2022-08-06 NOTE — Telephone Encounter (Signed)
Zelina came in to the office and wants to know how much her Xolair copay will be now that she how [employer].  Please advise.

## 2022-08-06 NOTE — Telephone Encounter (Signed)
[name] states she was surprised at the amount of her balance and states she has been paying on it and doesn't understand why she has a "bad" balance.  [name] states she has never received bills.  [name] wants to make sure he insurance is being filed.  Please advise.

## 2022-08-07 NOTE — Telephone Encounter (Signed)
I called patient and discussed her meds are from [facility] which is free. She was wanting to know what the injection copay would be and I advised her would not know until we file with the new INs

## 2022-08-23 ENCOUNTER — Encounter: Payer: Self-pay | Admitting: Allergy & Immunology

## 2022-08-23 ENCOUNTER — Other Ambulatory Visit: Payer: Self-pay

## 2022-08-23 ENCOUNTER — Ambulatory Visit (INDEPENDENT_AMBULATORY_CARE_PROVIDER_SITE_OTHER): Payer: HMO | Admitting: Allergy & Immunology

## 2022-08-23 VITALS — BP 142/84 | HR 77 | Temp 98.3°F | Resp 20 | Wt 139.1 lb

## 2022-08-23 DIAGNOSIS — B370 Candidal stomatitis: Secondary | ICD-10-CM | POA: Diagnosis not present

## 2022-08-23 DIAGNOSIS — L501 Idiopathic urticaria: Secondary | ICD-10-CM

## 2022-08-23 DIAGNOSIS — Z91013 Allergy to seafood: Secondary | ICD-10-CM | POA: Diagnosis not present

## 2022-08-23 MED ORDER — NYSTATIN 100000 UNIT/ML MT SUSP: 5.0000 mL | Freq: Four times a day (QID) | OROMUCOSAL | 0 refills | Status: AC

## 2022-08-23 NOTE — Progress Notes (Signed)
FOLLOW UP  Date of Service/Encounter:  08/23/22   Assessment:   Chronic idiopathic urticaria - doing well on Xolair every 3 weeks plus suppressive antihistamines   Anaphylaxis to shellfish  Oral candidiasis - starting nystatin  Plan/Recommendations:   1. Chronic urticaria with recurrent anaphylaxis episodes - Allegra half a tablet daily.  - Continue with Xolair every FOUR weeks.  2. Tongue lesions - Start nystatin swish and spit four times daily for 10 days.  - This is likely a post viral thing going on.   3. Follow up as scheduled in June!     Subjective:   [name] is a 77 y.o. 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  [name] is a 77 y.o. female presenting for a sick visit.  She was last seen in December 2023.  At that time, [name] recommended spacing her Xolair to every 4 weeks.  She was continued on Allegra half a tablet once a day.  She continued to avoid shellfish.  EpiPen was up-to-date.  She was started on albuterol due to some wheezing.  Since last visit, she has done very well.  She is very bubbly today and interactive.  She is doing well on monthly Xolair injections.  She has had no breakthrough symptoms.  She is just doing Allegra half a tablet once a day.  She is not interested in spacing out her Xolair any further, since she is doing so  very well.  She does report that she has some lesions on her tongue.  She said her tongue looks more rough than usual.  It is a bit painful and she has been eating a little bit less than normal.  She has had candidiasis in the past and is wondering if she needs nystatin.  She otherwise has had no other issues.  She denies any recent antibiotic courses or steroid courses.  She does not use any inhaled steroid medications for her breathing.  Otherwise, there have been no changes to her past medical history, surgical history, family history, or social history.    Review of Systems  Constitutional:  Negative for chills and fever.  HENT:  Positive for sore throat.        Denies rhinorrhea, nasal congestion, and postnasal drip  Eyes:        Denies itchy watery eyes  Respiratory:  Negative for cough, shortness of breath and wheezing.        Reports wheezing once in a while.  She does not ever remember having an albuterol inhaler being diagnosed with albuterol.  Denies cough, tightness in chest, shortness of breath, and nocturnal awakenings due to breathing problems  Cardiovascular:  Negative for chest pain and palpitations.  Gastrointestinal:        Denies heartburn or reflux symptoms  Genitourinary:  Negative for frequency.  Skin:  Negative for itching  and rash.  Neurological:  Negative for headaches.       Reports headaches now and then       Objective:   Blood pressure (!) 142/84, pulse 77, temperature 98.3 F (36.8 C), temperature source Temporal, resp. rate 20, weight 139 lb 1.6 oz (63.1 kg), SpO2 95 %. Body mass index is 25.44 kg/m.    Physical Exam Vitals reviewed.  Constitutional:      Appearance: She is well-developed.     Comments: Super talkative and friendly. Appreciative.   HENT:     Head: Normocephalic and atraumatic.     Right Ear: Tympanic membrane, ear canal and external ear normal.     Left Ear: Tympanic membrane, ear canal and external ear normal.     Nose: No  nasal deformity, septal deviation, mucosal edema or rhinorrhea.     Right Turbinates: Not enlarged, swollen or pale.     Left Turbinates: Not enlarged, swollen or pale.     Right Sinus: No maxillary sinus tenderness or frontal sinus tenderness.     Left Sinus: No maxillary sinus tenderness or frontal sinus tenderness.     Mouth/Throat:     Mouth: Mucous membranes are not pale and not dry.     Pharynx: Uvula midline.     Comments: She does have a very rough in time.  There is a white tinge to some areas in her buccal mucosa. Eyes:     General: Lids are normal. No allergic shiner.       Right eye: No discharge.        Left eye: No discharge.     Conjunctiva/sclera: Conjunctivae normal.     Right eye: Right conjunctiva is not injected. No chemosis.    Left eye: Left conjunctiva is not injected. No chemosis.    Pupils: Pupils are equal, round, and reactive to light.  Cardiovascular:     Rate and Rhythm: Normal rate and regular rhythm.     Heart sounds: Normal heart sounds.  Pulmonary:     Effort: Pulmonary effort is normal. No tachypnea, accessory muscle usage or respiratory distress.     Breath sounds: Normal breath sounds. No wheezing, rhonchi or rales.  Chest:     Chest wall: No tenderness.  Lymphadenopathy:     Cervical: No cervical adenopathy.  Skin:    General: Skin is warm.     Capillary Refill: Capillary refill takes less than 2 seconds.     Coloration: Skin is not pale.     Findings: No abrasion, erythema, petechiae or rash. Rash is not papular, urticarial or vesicular.     Comments: No urticaria.  Very clear.  Neurological:     Mental Status: She is alert.  Psychiatric:        Behavior: Behavior is cooperative.      Diagnostic studies: none     Salvatore Marvel, MD  [facility] [place]

## 2022-08-23 NOTE — Patient Instructions (Addendum)
1. Chronic urticaria with recurrent anaphylaxis episodes - Allegra half a tablet daily.  - Continue with Xolair every FOUR weeks.  2. Tongue lesions - Start nystatin swish and spit four times daily for 10 days.  - This is likely a post viral thing going on.   3. Follow up as scheduled in June!    Please inform us of any Emergency Department visits, hospitalizations, or changes in symptoms. Call us before going to the ED for breathing or allergy symptoms since we might be able to fit you in for a sick visit. Feel free to contact us anytime with any questions, problems, or concerns.  It was a pleasure to see you again today!   Websites that have reliable patient informat Dr. Neldon Mc ion: 1. American Academy of Asthma, Allergy, and Immunology: www.aaaai.org 2. Food Allergy Research and Education (FARE): foodallergy.org 3. Mothers of Asthmatics: http://www.asthmacommunitynetwork.org 4. American College of Allergy, Asthma, and Immunology: www.acaai.org   COVID-19 Vaccine Information can be found at: ShippingScam.co.uk For questions related to vaccine distribution or appointments, please email [email or web address] or call [phone].     "Like" [place] on Facebook and Instagram for our latest updates!       Make sure you are registered to vote! If you have moved or changed any of your contact information, you will need to get this updated before voting!  In some cases, you MAY be able to register to vote online: [email or web address]

## 2022-08-25 DIAGNOSIS — E663 Overweight: Secondary | ICD-10-CM | POA: Diagnosis not present

## 2022-08-25 DIAGNOSIS — K1370 Unspecified lesions of oral mucosa: Secondary | ICD-10-CM | POA: Diagnosis not present

## 2022-08-25 DIAGNOSIS — F419 Anxiety disorder, unspecified: Secondary | ICD-10-CM | POA: Diagnosis not present

## 2022-08-25 DIAGNOSIS — Z6825 Body mass index (BMI) 25.0-25.9, adult: Secondary | ICD-10-CM | POA: Diagnosis not present

## 2022-09-03 ENCOUNTER — Telehealth: Payer: Self-pay | Admitting: Allergy & Immunology

## 2022-09-03 ENCOUNTER — Ambulatory Visit (INDEPENDENT_AMBULATORY_CARE_PROVIDER_SITE_OTHER): Payer: HMO

## 2022-09-03 DIAGNOSIS — L501 Idiopathic urticaria: Secondary | ICD-10-CM | POA: Diagnosis not present

## 2022-09-03 MED ORDER — NYSTATIN 100000 UNIT/ML MT SUSP: 5.0000 mL | Freq: Four times a day (QID) | OROMUCOSAL | 0 refills | Status: AC

## 2022-09-03 NOTE — Telephone Encounter (Signed)
Pt states she is down to two pills and she is better but not in the clear, she wants to know if she needs a refill or need to be seen at this point.

## 2022-09-03 NOTE — Telephone Encounter (Signed)
I sent in more nystatin for her for ten more days. Can someone call to let her know?  Salvatore Marvel, MD [facility]

## 2022-09-03 NOTE — Telephone Encounter (Signed)
Called patient - DOB/Pharmacy verified - stated she is down to 2 days worth of gargles for the Nystatin Swish  - statement below is incorrect.  Patient stated she is doing better with her tongue lesions but not all of the lesions are gone.  Patient stated if provider thinks she should go another round please [facility]ill [facility] [address] due to the [facility] not having any [facility] in [facility].  Patient advised message would be forwarded to provider for next step.  Patient verbalized understanding, no further questions.

## 2022-09-04 NOTE — Telephone Encounter (Signed)
Spoke to patient and informed her of the additional Nystatin sent into the pharmacy for another 10 days. [name] stated she is feeling better it's just not completely gone away. She will pick the other script up today.  [employer] [phone]

## 2022-09-04 NOTE — Telephone Encounter (Signed)
Noted - sounds good!  ? ?Sya Nestler, MD ?[facility] ? ?

## 2022-09-04 NOTE — Telephone Encounter (Signed)
I tried calling the patient to inform. I left a vm to call the office back.

## 2022-09-05 ENCOUNTER — Other Ambulatory Visit: Payer: Self-pay | Admitting: Allergy & Immunology

## 2022-09-13 ENCOUNTER — Ambulatory Visit (INDEPENDENT_AMBULATORY_CARE_PROVIDER_SITE_OTHER): Payer: HMO | Admitting: Allergy & Immunology

## 2022-09-13 ENCOUNTER — Telehealth: Payer: Self-pay | Admitting: Family Medicine

## 2022-09-13 ENCOUNTER — Encounter: Payer: Self-pay | Admitting: Allergy & Immunology

## 2022-09-13 VITALS — BP 156/80 | HR 78 | Temp 98.1°F | Wt 139.4 lb

## 2022-09-13 DIAGNOSIS — K1379 Other lesions of oral mucosa: Secondary | ICD-10-CM

## 2022-09-13 DIAGNOSIS — L501 Idiopathic urticaria: Secondary | ICD-10-CM

## 2022-09-13 NOTE — Telephone Encounter (Signed)
Received a call from [facility]. They had pt at that facility and they report that pt's bp was 156/80. They asked that pt be seen. Information was relayed to Dr. Redmond School and he advised to inform Allergy and Asthma that pt could leave that facility, go home and he would call her and he would address this situation with her directly via a phone call.  Sending this message back to Dr. Redmond School.

## 2022-09-13 NOTE — Progress Notes (Signed)
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 We are going to do some patch testing to see whether the metal or epoxy in her dentures is related to the mouth pain that she is experiencing. If testing is positive, she can talk to her dentist about changing the dentures out.    Subjective:   QUATINA MURIE is a 77 y.o. 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  Ortencia is a 77 y.o. female presenting for a sick visit.  She was last seen in February 2024.  At that time, we continue with Allegra half a tablet daily as well as Xolair every 4 weeks.  She reported about her tongue lesions.  We started her on nystatin swish and spit 4 times daily for 10 days.  We felt that this was likely a postviral immunosuppressive process.  Since last visit, she thought that she was getting better. She was on the nystatin QID. Yesterday, she had a fever blister on her  lip. She has been having some upset stomach going on. She has been having viral GI symptoms as well. She has not been vomiting. She has had diarrhea to a certain extent. She is having some sore throat but no fever. There are times when it feels like it is fine, but other times it is more persistent.   She does have some GERD and she was taking something over the counter. She will take a Rolaids, but she has not been using it every day. Throat irritation has been consistent. She has been eating and drinking OK.   She has not changed her dentures recently. She has been with the same dentures for a long period of time.  Otherwise, there have been no changes to her past medical history, surgical history, family history, or social history.    Review of Systems  Constitutional:  Negative for chills and fever.  HENT:  Positive for sore throat.        Denies rhinorrhea, nasal congestion, and postnasal drip  Eyes:        Denies itchy watery eyes  Respiratory:  Negative for cough, shortness of breath and wheezing.        Reports wheezing once in a while.  She does not ever remember having an albuterol inhaler being diagnosed with albuterol.  Denies cough, tightness in chest, shortness of breath, and nocturnal awakenings due to breathing problems  Cardiovascular:  Negative for chest pain and palpitations.  Gastrointestinal:        Denies heartburn or reflux symptoms  Genitourinary:  Negative for frequency.  Skin:  Positive for itching. Negative for rash.  Neurological:  Negative for headaches.       Reports headaches now and then  Endo/Heme/Allergies:  Positive for environmental allergies.       Objective:   Blood pressure (!) 156/80, pulse 78, temperature 98.1 F (36.7 C), temperature source Temporal, weight 139 lb 6.4 oz (63.2 kg), SpO2 96 %. Body mass index is 25.5 kg/m.    Physical Exam Vitals reviewed.  Constitutional:      Appearance: She is well-developed.     Comments: Super  talkative and friendly. Appreciative.   HENT:     Head: Normocephalic and atraumatic.     Right Ear: Tympanic membrane, ear canal and external ear normal.     Left Ear: Tympanic membrane, ear canal and external ear normal.     Nose: No nasal deformity, septal deviation, mucosal edema or rhinorrhea.     Right Turbinates: Not enlarged, swollen or pale.     Left Turbinates: Not enlarged, swollen or pale.     Right Sinus: No maxillary sinus tenderness or frontal sinus tenderness.     Left Sinus: No maxillary sinus tenderness or frontal sinus tenderness.     Mouth/Throat:     Mouth: Mucous membranes are not pale and not dry.     Pharynx: Uvula midline.     Comments: Minimal white tinge to some areas in her buccal mucosa. There is a cold sore on the right side of her mouth on the inner surface. Eyes:     General: Lids are normal. No allergic shiner.       Right eye: No discharge.        Left eye: No discharge.     Conjunctiva/sclera: Conjunctivae normal.     Right eye: Right conjunctiva is not injected. No chemosis.    Left eye: Left conjunctiva is not injected. No chemosis.    Pupils: Pupils are equal, round, and reactive to light.  Cardiovascular:     Rate and Rhythm: Normal rate and regular rhythm.     Heart sounds: Normal heart sounds.  Pulmonary:     Effort: Pulmonary effort is normal. No tachypnea, accessory muscle usage or respiratory distress.     Breath sounds: Normal breath sounds. No wheezing, rhonchi or rales.  Chest:     Chest wall: No tenderness.  Lymphadenopathy:     Cervical: No cervical adenopathy.  Skin:    General: Skin is warm.     Capillary Refill: Capillary refill takes less than 2 seconds.     Coloration: Skin is not pale.     Findings: No abrasion, erythema, petechiae or rash. Rash is not papular, urticarial or vesicular.     Comments: No urticaria.  Very clear.  Neurological:     Mental Status: She is alert.  Psychiatric:        Behavior: Behavior is  cooperative.      Diagnostic studies: none        Salvatore Marvel, MD  [facility] [place]

## 2022-09-17 ENCOUNTER — Encounter: Payer: Self-pay | Admitting: Internal Medicine

## 2022-09-17 ENCOUNTER — Other Ambulatory Visit: Payer: Self-pay

## 2022-09-17 ENCOUNTER — Ambulatory Visit (INDEPENDENT_AMBULATORY_CARE_PROVIDER_SITE_OTHER): Payer: HMO | Admitting: Internal Medicine

## 2022-09-17 VITALS — BP 140/58 | HR 85 | Temp 98.4°F | Resp 16

## 2022-09-17 DIAGNOSIS — L2389 Allergic contact dermatitis due to other agents: Secondary | ICD-10-CM | POA: Diagnosis not present

## 2022-09-17 NOTE — Patient Instructions (Addendum)
Allergic contact dermatitis - Instructions provided on care of the patches for the next 48 hours. - [name] was instructed to avoid showering for the next 48 hours. - [name] will follow up in 48 hours and 96 hours for patch readings.  - TRUE test placed today that looks at other components of allergy to dentures.  On Wednesday, we will place the metal patches on because we did not have them in stock today.

## 2022-09-17 NOTE — Progress Notes (Signed)
    Follow-up Note  RE: [name] MRN: [id number] DOB: [birth year] Date of Office Visit: 09/17/2022  Primary care provider: Denita Lung, MD Referring provider: Denita Lung, MD   [name] returns to the office today for the patch test placement, given suspected history of contact dermatitis.    Diagnostics: True Test  placed.    Plan:   Allergic contact dermatitis - Instructions provided on care of the patches for the next 48 hours. - [name] was instructed to avoid showering for the next 48 hours. - [name] will follow up in 48 hours and 96 hours for patch readings.  - TRUE test placed today that looks at other components of allergy to dentures.  On Wednesday, we will place the metal patches on because we did not have them in stock today.  Cathy Flor MD [facility] [place]

## 2022-09-19 ENCOUNTER — Ambulatory Visit (INDEPENDENT_AMBULATORY_CARE_PROVIDER_SITE_OTHER): Payer: HMO | Admitting: Allergy & Immunology

## 2022-09-19 ENCOUNTER — Encounter: Payer: Self-pay | Admitting: Allergy & Immunology

## 2022-09-19 VITALS — Temp 98.0°F

## 2022-09-19 DIAGNOSIS — L230 Allergic contact dermatitis due to metals: Secondary | ICD-10-CM | POA: Diagnosis not present

## 2022-09-19 NOTE — Addendum Note (Signed)
Addended by: Norville Haggard on: 09/19/2022 09:14 AM   Modules accepted: Orders

## 2022-09-19 NOTE — Progress Notes (Signed)
    Follow-up Note  RE: [name] MRN: [id number] DOB: 1945/09/01 Date of Office Visit: 09/19/2022  Primary care provider: Denita Lung, MD Referring provider: Denita Lung, MD   [name] returns to the office today for the initial patch test interpretation, given suspected history of contact dermatitis.  She has done well with the patches.  She denies any itching at all.  She actually has left her back teeth out, and the lesions in her mouth have improved, so she thinks this is definitely related to her teeth.   Diagnostics:   TRUE TEST 48-hour reading:  1+ reaction to #28 (Gold sodium thiosulfate)  Metal series placed today in the clinic.    Plan:   Allergic contact dermatitis - The patient has been provided detailed information regarding the substances she is sensitive to, as well as products containing the substances.   - Meticulous avoidance of these substances is recommended.  - If avoidance is not possible, the use of barrier creams or lotions is recommended. - If symptoms persist or progress despite meticulous avoidance of the above chemicals, Dermatology Referral may be warranted. - Metal series placed today since this was not done on Monday.    Salvatore Marvel, MD  [facility] [place]

## 2022-09-19 NOTE — Patient Instructions (Signed)
Contact dermatitis - Testing was positive to gold today. - We will see what else pops up over the week. - Metal series placed today. - See you on Friday!

## 2022-09-21 ENCOUNTER — Other Ambulatory Visit: Payer: Self-pay

## 2022-09-21 ENCOUNTER — Ambulatory Visit: Payer: HMO | Admitting: Family Medicine

## 2022-09-21 ENCOUNTER — Encounter: Payer: Self-pay | Admitting: Family Medicine

## 2022-09-21 VITALS — Temp 98.0°F

## 2022-09-21 DIAGNOSIS — L259 Unspecified contact dermatitis, unspecified cause: Secondary | ICD-10-CM | POA: Insufficient documentation

## 2022-09-21 DIAGNOSIS — L235 Allergic contact dermatitis due to other chemical products: Secondary | ICD-10-CM

## 2022-09-21 DIAGNOSIS — L230 Allergic contact dermatitis due to metals: Secondary | ICD-10-CM | POA: Insufficient documentation

## 2022-09-21 NOTE — Progress Notes (Signed)
    Follow-up Note  RE: [name] MRN: [id number] DOB: Nov 15, 1945 Date of Office Visit: 09/21/2022  Primary care provider: Denita Lung, MD Referring provider: Denita Lung, MD   [name] returns to the office today for the final patch test interpretation, given suspected history of contact dermatitis.    Diagnostics:   TRUE TEST 96-hour hour reading:  positive reaction to #28 (Gold sodium thiosulfate)  Metals patch reading: 48 hours Metals Patch     Time Antigen Placed  09/19/2022    Location  Back    Number of Test  11    Reading Interval  Day    Chromium chloride 1%  0     Cobalt chloride hexahydrate 1%  0     Molybdenum chloride 0.5%  0     Nickel sulfate hexahydrate 5%  0     Potassium dichromate 0.25%  0     Copper sulfate pentahydrate 2%  0     Titanium 0.1%  0     Manganese chloride 0.5%  0     Tantal 1%  0    Vanadium pentoxide 10%  0   Aluminum hydroxide 10%  0    Comments  NA    Plan:   Allergic contact dermatitis - The patient has been provided detailed information regarding the substances she is sensitive to, as well as products containing the substances.   - Meticulous avoidance of these substances is recommended.  - If avoidance is not possible, the use of barrier creams or lotions is recommended. - If symptoms persist or progress despite meticulous avoidance of gold sodium thiosulfate, a dermatology referral may be warranted.  Call the clinic if this treatment plan is not working well for you  Follow up in 3 days for metals patch reading or sooner if needed.  Thank you for the opportunity to care for this patient.  Please do not hesitate to contact me with questions.  Gareth Morgan, FNP [facility]

## 2022-09-21 NOTE — Patient Instructions (Addendum)
Diagnostics:   TRUE TEST 96-hour hour reading: positive reaction to #28 (Gold sodium thiosulfate)  Metals patch reading: 48 hours Metals Patch     Time Antigen Placed  09/19/2022    Location  Back    Number of Test  11    Reading Interval  Day    Chromium chloride 1%  0     Cobalt chloride hexahydrate 1%  0     Molybdenum chloride 0.5%  0     Nickel sulfate hexahydrate 5%  0     Potassium dichromate 0.25%  0     Copper sulfate pentahydrate 2%  0     Titanium 0.1%  0     Manganese chloride 0.5%  0     Tantal 1%  0    Vanadium pentoxide 10%  0   Aluminum hydroxide 10%  0    Comments  NA    Plan:   Allergic contact dermatitis - The patient has been provided detailed information regarding the substances she is sensitive to, as well as products containing the substances.   - Meticulous avoidance of these substances is recommended.  - If avoidance is not possible, the use of barrier creams or lotions is recommended. - If symptoms persist or progress despite meticulous avoidance of gold sodium thiosulfate, a dermatology referral may be warranted.  Call the clinic if this treatment plan is not working well for you  Follow up in 3 days for metals patch reading or sooner if needed.

## 2022-09-24 ENCOUNTER — Ambulatory Visit: Payer: HMO | Admitting: Internal Medicine

## 2022-09-24 VITALS — Temp 98.1°F

## 2022-09-24 DIAGNOSIS — L2389 Allergic contact dermatitis due to other agents: Secondary | ICD-10-CM

## 2022-09-24 NOTE — Progress Notes (Signed)
   Follow Up Note  RE: [name] MRN: [id number] DOB: 30-Nov-1945 Date of Office Visit: 09/24/2022  Referring provider: Denita Lung, MD Primary care provider: Denita Lung, MD  History of Present Illness: I had the pleasure of seeing [name] for a follow up visit at the [facility] [place] on 09/24/2022. She is a 77 y.o. female, who is being followed for contact dermatitis. Today she is here for final patch test interpretation, given suspected history of contact dermatitis.   Diagnostics:  TRUE TEST final reading:  positive reaction to #28 (Gold sodium thiosulfate)   Metals patch reading: 96 hours     Metals Patch     Time Antigen Placed  09/24/2022    Location  Back    Number of Test  11    Reading Interval  Day    Chromium chloride 1%  0     Cobalt chloride hexahydrate 1%  0     Molybdenum chloride 0.5%  0     Nickel sulfate hexahydrate 5%  0     Potassium dichromate 0.25%  0     Copper sulfate pentahydrate 2%  0     Titanium 0.1%  0     Manganese chloride 0.5%  0     Tantal 1%  0     Vanadium pentoxide 10%  0    Aluminum hydroxide 10%  0       Assessment and Plan: [name] is a 77 y.o. female with: Concern for Contact Dermatitis:  The patient has been provided detailed information regarding the substances she is sensitive to, as well as products containing the substances.  Meticulous avoidance of these substances is recommended. If avoidance is not possible, the use of barrier creams or lotions is recommended. If symptoms persist or progress despite meticulous avoidance of chemicals/substances above, dermatology evaluation may be warranted. She denies any reactions to jewelry and discussed that the gold could be a false positive.  If it bothers her, then I would recommend avoiding it but otherwise, she can wear gold jewelry since she tolerates it.   She also wonders if the mouth pain was never due to the dentures but more because she used the  swish/spit for almost a month.  Did discuss that you can have side effects due to prolonged use of therapy as she was supposed to only do this for 10 days.  Can try rewearing the dentures to see how she does since she did not react to much on the TRUE + metal patch testing.   No other chemicals/metals identified that could explain her mouth pain.   No follow-ups on file.  It was my pleasure to see [name] today and participate in her care. Please feel free to contact me with any questions or concerns.  Sincerely,   Harlon Flor, MD [facility]

## 2022-09-24 NOTE — Patient Instructions (Signed)
TRUE TEST final reading:  positive reaction to #28 (Gold sodium thiosulfate)   Metals patch reading: 96 hours     Metals Patch     Time Antigen Placed  09/24/2022    Location  Back    Number of Test  11    Reading Interval  Day    Chromium chloride 1%  0     Cobalt chloride hexahydrate 1%  0     Molybdenum chloride 0.5%  0     Nickel sulfate hexahydrate 5%  0     Potassium dichromate 0.25%  0     Copper sulfate pentahydrate 2%  0     Titanium 0.1%  0     Manganese chloride 0.5%  0     Tantal 1%  0     Vanadium pentoxide 10%  0    Aluminum hydroxide 10%  0

## 2022-10-01 ENCOUNTER — Ambulatory Visit (INDEPENDENT_AMBULATORY_CARE_PROVIDER_SITE_OTHER): Payer: HMO

## 2022-10-01 DIAGNOSIS — L501 Idiopathic urticaria: Secondary | ICD-10-CM

## 2022-10-05 ENCOUNTER — Other Ambulatory Visit: Payer: Self-pay

## 2022-10-05 ENCOUNTER — Telehealth: Payer: Self-pay | Admitting: Family Medicine

## 2022-10-05 DIAGNOSIS — I700 Atherosclerosis of aorta: Secondary | ICD-10-CM

## 2022-10-05 MED ORDER — ATORVASTATIN CALCIUM 20 MG PO TABS: ORAL_TABLET | ORAL | 0 refills | Status: DC

## 2022-10-05 NOTE — Telephone Encounter (Signed)
Pt left message she needs refill Ator[facility]atin to [facility], has appt next month

## 2022-10-11 ENCOUNTER — Telehealth: Payer: Self-pay | Admitting: Family Medicine

## 2022-10-11 NOTE — Telephone Encounter (Signed)
Called patient to schedule Medicare Annual Wellness Visit (AWV). Left message for patient to call back and schedule Medicare Annual Wellness Visit (AWV).  Last date of AWV: 10/23/21  Please schedule an appointment at any time with [facility].  If any questions, please contact me at [phone].  Thank you ,  Barkley Boards AWV direct phone # [phone]

## 2022-10-15 NOTE — Telephone Encounter (Signed)
Called patient to schedule Medicare Annual Wellness Visit (AWV). No voicemail available to leave a message.  Last date of AWV: 10/23/21  Please schedule an appointment at any time with [facility].  If any questions, please contact me at [phone].  Thank you ,  Barkley Boards AWV direct phone # [phone]   Returned patients call.  Patient left message 3/29

## 2022-10-29 ENCOUNTER — Ambulatory Visit (INDEPENDENT_AMBULATORY_CARE_PROVIDER_SITE_OTHER): Payer: HMO

## 2022-10-29 DIAGNOSIS — L501 Idiopathic urticaria: Secondary | ICD-10-CM

## 2022-10-31 ENCOUNTER — Ambulatory Visit (INDEPENDENT_AMBULATORY_CARE_PROVIDER_SITE_OTHER): Payer: HMO | Admitting: Family Medicine

## 2022-10-31 ENCOUNTER — Encounter: Payer: Self-pay | Admitting: Family Medicine

## 2022-10-31 ENCOUNTER — Other Ambulatory Visit: Payer: Self-pay

## 2022-10-31 VITALS — BP 136/86 | HR 64 | Temp 97.9°F | Resp 16 | Ht 61.5 in | Wt 137.2 lb

## 2022-10-31 DIAGNOSIS — J301 Allergic rhinitis due to pollen: Secondary | ICD-10-CM | POA: Diagnosis not present

## 2022-10-31 DIAGNOSIS — J4520 Mild intermittent asthma, uncomplicated: Secondary | ICD-10-CM

## 2022-10-31 DIAGNOSIS — E038 Other specified hypothyroidism: Secondary | ICD-10-CM

## 2022-10-31 DIAGNOSIS — Z1321 Encounter for screening for nutritional disorder: Secondary | ICD-10-CM | POA: Diagnosis not present

## 2022-10-31 DIAGNOSIS — Z0000 Encounter for general adult medical examination without abnormal findings: Secondary | ICD-10-CM

## 2022-10-31 DIAGNOSIS — N3941 Urge incontinence: Secondary | ICD-10-CM

## 2022-10-31 DIAGNOSIS — M8580 Other specified disorders of bone density and structure, unspecified site: Secondary | ICD-10-CM | POA: Diagnosis not present

## 2022-10-31 DIAGNOSIS — I700 Atherosclerosis of aorta: Secondary | ICD-10-CM | POA: Diagnosis not present

## 2022-10-31 DIAGNOSIS — L501 Idiopathic urticaria: Secondary | ICD-10-CM | POA: Diagnosis not present

## 2022-10-31 DIAGNOSIS — T7800XA Anaphylactic reaction due to unspecified food, initial encounter: Secondary | ICD-10-CM

## 2022-10-31 LAB — VITAMIN D 25 HYDROXY (VIT D DEFICIENCY, FRACTURES)

## 2022-10-31 MED ORDER — LEVOTHYROXINE SODIUM 100 MCG PO TABS: ORAL_TABLET | ORAL | 3 refills | Status: DC

## 2022-10-31 MED ORDER — ATORVASTATIN CALCIUM 20 MG PO TABS: ORAL_TABLET | ORAL | 0 refills | Status: DC

## 2022-10-31 NOTE — Progress Notes (Signed)
[name] is a 77 y.o. female who presents for annual wellness visit and follow-up on chronic medical conditions.  She has no particular concerns or complaints.  She continues on Xolair for her urticaria and is doing quite well with that.  She has not had the need for albuterol inhaler in quite some time.  She continues on her thyroid medicine without difficulty.  Also taking Lipitor with no aches or pains.  She does have a history of aortic atherosclerosis.  She does use Allegra for her seasonal allergies.  She does have osteopenia and is taking extra calcium and vitamin D.  Gets regular mammograms.  Immunizations and Health Maintenance Immunization History  Administered Date(s) Administered   DTaP 02/13/2002   Fluad Quad(high Dose 65+) 03/12/2019, 10/06/2020, 05/15/2021, 04/11/2022   Influenza Split 05/29/2011, 05/12/2012, 04/28/2013   Influenza, High Dose Seasonal PF 05/12/2014, 04/07/2015, 05/17/2016, 04/11/2017, 04/17/2018   Moderna Sars-Covid-2 Vaccination 09/22/2019, 10/19/2020   Pfizer Covid-19 Vaccine Bivalent Booster 58yrs & up 10/23/2021   Pneumococcal Conjugate-13 12/15/2013   Pneumococcal Polysaccharide-23 02/13/2002   Tdap 11/02/2020   Zoster Recombinat (Shingrix) 05/01/2018, 09/04/2018   Zoster, Live 06/30/2007   Health Maintenance Due  Topic Date Due   COVID-19 Vaccine (4 - 2023-24 season) 03/16/2022   Medicare Annual Wellness (AWV)  10/24/2022    Last Pap smear: n/a Last mammogram: 12/08/2021 Last colonoscopy: 04/11/2018 Last DEXA: 04/18/2022 Dentist: more than 1 year ago Ophtho: within the last year Exercise: silver sneakers twice a week, and yard work  Other doctors caring for patient include: Ophthalmology Dr. Hazle Quant  Advanced directives:  Yes. On file  Depression screen:  See questionnaire below.     10/31/2022    8:38 AM 10/23/2021    9:33 AM 10/14/2020    9:56 AM 09/21/2019    9:13 AM 09/18/2018    2:05 PM  Depression screen PHQ 2/9  Decreased  Interest 0 0 0 0 0  Down, Depressed, Hopeless 0 0 0 0 0  PHQ - 2 Score 0 0 0 0 0    Fall Risk Screen: see questionnaire below.    10/31/2022    8:38 AM 10/23/2021    9:30 AM 10/14/2020    9:56 AM 09/21/2019    9:13 AM 09/18/2018    2:03 PM  Fall Risk   Falls in the past year? 0 0 0 0 0  Number falls in past yr: 0 0 0    Injury with Fall? 0 0 0    Risk for fall due to : No Fall Risks No Fall Risks No Fall Risks    Follow up Falls evaluation completed Falls evaluation completed Falls evaluation completed      ADL screen:  See questionnaire below Functional Status Survey: Is the patient deaf or have difficulty hearing?: No Does the patient have difficulty seeing, even when wearing glasses/contacts?: No Does the patient have difficulty concentrating, remembering, or making decisions?: No Does the patient have difficulty walking or climbing stairs?: No Does the patient have difficulty dressing or bathing?: No Does the patient have difficulty doing errands alone such as visiting a doctor's office or shopping?: No   Review of Systems Constitutional: -, -unexpected weight change, -anorexia, -fatigue Allergy: -sneezing, -itching, -congestion Dermatology: denies changing moles, rash, lumps ENT: -runny nose, -ear pain, -sore throat,  Cardiology:  -chest pain, -palpitations, -orthopnea, Respiratory: -cough, -shortness of breath, -dyspnea on exertion, -wheezing,  Gastroenterology: -abdominal pain, -nausea, -vomiting, -diarrhea, -constipation, -dysphagia Hematology: -bleeding or bruising problems Musculoskeletal: -arthralgias, -  myalgias, -joint swelling, -back pain, - Ophthalmology: -vision changes,  Urology: -dysuria, -difficulty urinating,  -urinary frequency, -urgency, incontinence Neurology: -, -numbness, , -memory loss, -falls, -dizziness    PHYSICAL EXAM:  BP 136/86   Pulse 64   Temp 97.9 F (36.6 C) (Oral)   Resp 16   Ht 5' 1.5" (1.562 m)   Wt 137 lb 3.2 oz (62.2 kg)   SpO2  97% Comment: room air  BMI 25.50 kg/m   General Appearance: Alert, cooperative, no distress, appears stated age Head: Normocephalic, without obvious abnormality, atraumatic Eyes: PERRL, conjunctiva/corneas clear, EOM's intact,  Ears: Normal TM's and external ear canals Nose: Nares normal, mucosa normal, no drainage or sinus tenderness Throat: Lips, mucosa, and tongue normal; teeth and gums normal Neck: Supple, no lymphadenopathy;  thyroid:  no enlargement/tenderness/nodules; no carotid bruit or JVD Lungs: Clear to auscultation bilaterally without wheezes, rales or ronchi; respirations unlabored Heart: Regular rate and rhythm, S1 and S2 normal, no murmur, rubor gallop Abdomen: Soft, non-tender, nondistended, normoactive bowel sounds,  no masses, no hepatosplenomegaly Skin:  Skin color, texture, turgor normal, no rashes or lesions Lymph nodes: Cervical, supraclavicular, and axillary nodes normal Neurologic:  CNII-XII intact, normal strength, sensation and gait; reflexes 2+ and symmetric throughout Psych: Normal mood, affect, hygiene and grooming.  ASSESSMENT/PLAN: Routine general medical examination at a health care facility - Plan: CBC with Differential/Platelet, Comprehensive metabolic panel  Aortic atherosclerosis - Plan: Lipid panel, atorvastatin (LIPITOR) 20 MG tablet  Allergic rhinitis due to pollen, unspecified seasonality  Other specified hypothyroidism - Plan: levothyroxine (SYNTHROID) 100 MCG tablet  Osteopenia, unspecified location  Chronic idiopathic urticaria  Allergy with anaphylaxis due to food  Mild intermittent asthma, unspecified whether complicated  Encounter for vitamin deficiency screening - Plan: VITAMIN D 25 Hydroxy (Vit-D Deficiency, Fractures)    Discussed healthy diet, including goals of calcium and vitamin D intake   Immunization recommendations discussed.  Recommend RSV colonoscopy recommendations reviewed   Medicare Attestation I have personally  reviewed: The patient's medical and social history Their use of alcohol, tobacco or illicit drugs Their current medications and supplements The patient's functional ability including ADLs,fall risks, home safety risks, cognitive, and hearing and visual impairment Diet and physical activities Evidence for depression or mood disorders  The patient's weight, height, and BMI have been recorded in the chart.  I have made referrals, counseling, and provided education to the patient based on review of the above and I have provided the patient with a written personalized care plan for preventive services.     Sharlot Gowda, MD   10/31/2022

## 2022-11-01 LAB — LIPID PANEL
Chol/HDL Ratio: 2.2 ratio (ref 0.0–4.4)
Cholesterol, Total: 166 mg/dL (ref 100–199)
HDL: 74 mg/dL (ref >39)
LDL Chol Calc (NIH): 74 mg/dL (ref 0–99)
Triglycerides: 102 mg/dL (ref 0–149)
VLDL Cholesterol Cal: 18 mg/dL (ref 5–40)

## 2022-11-01 LAB — CBC WITH DIFFERENTIAL/PLATELET
Basophils Absolute: 0 10*3/uL (ref 0.0–0.2)
Basos: 0 %
EOS (ABSOLUTE): 0.4 10*3/uL (ref 0.0–0.4)
Eos: 6 %
Hematocrit: 43 % (ref 34.0–46.6)
Hemoglobin: 14.1 g/dL (ref 11.1–15.9)
Immature Grans (Abs): 0 10*3/uL (ref 0.0–0.1)
Immature Granulocytes: 0 %
Lymphocytes Absolute: 1.7 10*3/uL (ref 0.7–3.1)
Lymphs: 27 %
MCH: 31.5 pg (ref 26.6–33.0)
MCHC: 32.8 g/dL (ref 31.5–35.7)
MCV: 96 fL (ref 79–97)
Monocytes Absolute: 0.7 10*3/uL (ref 0.1–0.9)
Monocytes: 11 %
Neutrophils Absolute: 3.6 10*3/uL (ref 1.4–7.0)
Neutrophils: 56 %
Platelets: 260 10*3/uL (ref 150–450)
RBC: 4.48 x10E6/uL (ref 3.77–5.28)
RDW: 13.1 % (ref 11.7–15.4)
WBC: 6.4 10*3/uL (ref 3.4–10.8)

## 2022-11-01 LAB — COMPREHENSIVE METABOLIC PANEL
ALT: 18 IU/L (ref 0–32)
AST: 25 IU/L (ref 0–40)
Albumin/Globulin Ratio: 1.6 (ref 1.2–2.2)
Albumin: 4.4 g/dL (ref 3.8–4.8)
Alkaline Phosphatase: 104 IU/L (ref 44–121)
BUN/Creatinine Ratio: 21 (ref 12–28)
BUN: 14 mg/dL (ref 8–27)
Bilirubin Total: 0.4 mg/dL (ref 0.0–1.2)
CO2: 22 mmol/L (ref 20–29)
Calcium: 10 mg/dL (ref 8.7–10.3)
Chloride: 103 mmol/L (ref 96–106)
Creatinine, Ser: 0.68 mg/dL (ref 0.57–1.00)
Globulin, Total: 2.7 g/dL (ref 1.5–4.5)
Glucose: 87 mg/dL (ref 70–99)
Potassium: 4.4 mmol/L (ref 3.5–5.2)
Sodium: 141 mmol/L (ref 134–144)
Total Protein: 7.1 g/dL (ref 6.0–8.5)
eGFR: 91 mL/min/{1.73_m2} (ref >59)

## 2022-11-06 ENCOUNTER — Other Ambulatory Visit: Payer: Self-pay | Admitting: Family Medicine

## 2022-11-06 DIAGNOSIS — Z1231 Encounter for screening mammogram for malignant neoplasm of breast: Secondary | ICD-10-CM

## 2022-11-23 ENCOUNTER — Telehealth: Payer: Self-pay

## 2022-11-23 NOTE — Telephone Encounter (Signed)
Patient called to request someone from billing to give her a call regarding her bill.  Patient can be reached at [phone].  Thanks

## 2022-11-26 ENCOUNTER — Ambulatory Visit (INDEPENDENT_AMBULATORY_CARE_PROVIDER_SITE_OTHER): Payer: HMO

## 2022-11-26 DIAGNOSIS — L501 Idiopathic urticaria: Secondary | ICD-10-CM | POA: Diagnosis not present

## 2022-11-28 ENCOUNTER — Telehealth: Payer: Self-pay | Admitting: Allergy & Immunology

## 2022-11-28 NOTE — Telephone Encounter (Signed)
Noted  

## 2022-11-28 NOTE — Telephone Encounter (Signed)
Patient states she had a reaction after receiving her injections. States she took tylenol for pain. Area on left arm was warn and red and about a quarter size redness. Patient denies having breathing issues of any sort. Please advise on how to proceeds with next injection if any.  Thank you

## 2022-11-28 NOTE — Telephone Encounter (Signed)
I think we should just do the same dose.   Malachi Bonds, MD [facility]

## 2022-12-14 ENCOUNTER — Ambulatory Visit: Payer: Self-pay

## 2022-12-17 ENCOUNTER — Ambulatory Visit: Payer: HMO

## 2022-12-17 DIAGNOSIS — L501 Idiopathic urticaria: Secondary | ICD-10-CM | POA: Diagnosis not present

## 2022-12-17 MED ORDER — OMALIZUMAB 150 MG/ML ~~LOC~~ SOSY
300.0000 mg | PREFILLED_SYRINGE | SUBCUTANEOUS | Status: DC
  Administered 2022-12-17 – 2023-07-15 (×7): 300 mg via SUBCUTANEOUS

## 2022-12-24 ENCOUNTER — Other Ambulatory Visit: Payer: Self-pay | Admitting: *Deleted

## 2022-12-24 MED ORDER — OMALIZUMAB 150 MG/ML ~~LOC~~ SOSY: 300.0000 mg | PREFILLED_SYRINGE | SUBCUTANEOUS | 11 refills | Status: DC

## 2023-01-09 ENCOUNTER — Encounter: Payer: Self-pay | Admitting: Allergy & Immunology

## 2023-01-09 ENCOUNTER — Other Ambulatory Visit: Payer: Self-pay

## 2023-01-09 ENCOUNTER — Ambulatory Visit: Payer: HMO | Admitting: Allergy & Immunology

## 2023-01-09 VITALS — BP 128/78 | HR 78 | Temp 98.0°F | Resp 16 | Ht 62.0 in | Wt 138.4 lb

## 2023-01-09 DIAGNOSIS — Z91013 Allergy to seafood: Secondary | ICD-10-CM

## 2023-01-09 DIAGNOSIS — L2389 Allergic contact dermatitis due to other agents: Secondary | ICD-10-CM

## 2023-01-09 DIAGNOSIS — L501 Idiopathic urticaria: Secondary | ICD-10-CM

## 2023-01-09 NOTE — Progress Notes (Signed)
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 Chronic urticaria with recurrent anaphylaxis episodes - Continue with Allegra half a tablet daily.  - Let us space out the Xolair to EVERY 5 WEEKS. - You seem to be doing very well.  2. Tongue lesions - Resolved.   3. Shellfish allergy - Continue to avoid shellfish. - EpiPen is up to date.   4. Follow up in one year or earlier if needed.    Subjective:   [name] is a 77 y.o. 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  [name] is a 77 y.o. female presenting for a follow up visit.  She was last seen in February 2024 as a regular office visit.  At that time, she was not improving with her mouth pain with the nystatin.  We decided to do patch testing and she ended up being positive to gold.  She was otherwise doing well with her thyroid and her hives.   She remained on Xolair every 4 weeks.  Since the last visit, she has done well. She never did get to the dentist. It has all cleared up before she followed up with them. So in the end she is not sure what was causing the lip lesions. She stopped the nystatin and everything seemed to get better.   She remains on the Xolair. She is getting it every 4 weeks. She is also doing 1/2 of an Allegra tablet daily. She has been doing well without any breakthrough symptoms. She is open to trying to wean this more aggressively. Ideally she would like to be off of the Xolair entirely.   She is busy in the singles group in her church and a widowed group in her church. She is not seeing anyone romantically right now and seems happy at this point in time. She is hoping to go to the beach.   Otherwise, there have been no changes to her past medical history, surgical history, family history, or social history.    Review of Systems  Constitutional:  Negative for chills and fever.  HENT:  Negative for sore throat.        Denies rhinorrhea, nasal congestion, and postnasal drip  Eyes:        Denies itchy watery eyes  Respiratory:  Negative for cough, shortness of breath and wheezing.  Reports wheezing once in a while.  She does not ever remember having an albuterol inhaler being diagnosed with albuterol.  Denies cough, tightness in chest, shortness of breath, and nocturnal awakenings due to breathing problems  Cardiovascular:  Negative for chest pain and palpitations.  Gastrointestinal:  Negative for abdominal pain, diarrhea, heartburn, nausea and vomiting.       Denies heartburn or reflux symptoms  Genitourinary:  Negative for frequency.  Skin:  Negative for itching and rash.  Neurological:  Negative for headaches.       Reports headaches now and then  Endo/Heme/Allergies:  Negative for environmental allergies. Does not bruise/bleed easily.       Objective:   Blood pressure 128/78, pulse 78,  temperature 98 F (36.7 C), temperature source Temporal, resp. rate 16, height 5\' 2"  (1.575 m), weight 138 lb 6.4 oz (62.8 kg), SpO2 97 %. Body mass index is 25.31 kg/m.    Physical Exam Vitals reviewed.  Constitutional:      Appearance: She is well-developed.     Comments: Super talkative and friendly. Appreciative.   HENT:     Head: Normocephalic and atraumatic.     Right Ear: Tympanic membrane, ear canal and external ear normal.     Left Ear: Tympanic membrane, ear canal and external ear normal.     Nose: No nasal deformity, septal deviation, mucosal edema or rhinorrhea.     Right Turbinates: Not enlarged, swollen or pale.     Left Turbinates: Not enlarged, swollen or pale.     Right Sinus: No maxillary sinus tenderness or frontal sinus tenderness.     Left Sinus: No maxillary sinus tenderness or frontal sinus tenderness.     Comments: No nasal polyps.     Mouth/Throat:     Mouth: Mucous membranes are not pale and not dry.     Pharynx: Uvula midline.     Comments: Minimal white tinge to some areas in her buccal mucosa. There is a cold sore on the right side of her mouth on the inner surface. Eyes:     General: Lids are normal. No allergic shiner.       Right eye: No discharge.        Left eye: No discharge.     Conjunctiva/sclera: Conjunctivae normal.     Right eye: Right conjunctiva is not injected. No chemosis.    Left eye: Left conjunctiva is not injected. No chemosis.    Pupils: Pupils are equal, round, and reactive to light.  Cardiovascular:     Rate and Rhythm: Normal rate and regular rhythm.     Heart sounds: Normal heart sounds.  Pulmonary:     Effort: Pulmonary effort is normal. No tachypnea, accessory muscle usage or respiratory distress.     Breath sounds: Normal breath sounds. No wheezing, rhonchi or rales.  Chest:     Chest wall: No tenderness.  Lymphadenopathy:     Cervical: No cervical adenopathy.  Skin:    General: Skin is warm.     Capillary Refill:  Capillary refill takes less than 2 seconds.     Coloration: Skin is not pale.     Findings: No abrasion, erythema, petechiae or rash. Rash is not papular, urticarial or vesicular.     Comments: No urticaria.  Very clear.  Neurological:     Mental Status: She is alert.  Psychiatric:        Behavior: Behavior is cooperative.      Diagnostic studies: none  Salvatore Marvel, MD  [facility]

## 2023-01-09 NOTE — Patient Instructions (Addendum)
1. Chronic urticaria with recurrent anaphylaxis episodes - Continue with Allegra half a tablet daily.  - Let us space out the Xolair to EVERY 5 WEEKS. - You seem to be doing very well.  2. Tongue lesions - Resolved.   3. Follow up in one year or earlier if needed.    Please inform us of any Emergency Department visits, hospitalizations, or changes in symptoms. Call us before going to the ED for breathing or allergy symptoms since we might be able to fit you in for a sick visit. Feel free to contact us anytime with any questions, problems, or concerns.  It was a pleasure to see you again today!   Websites that have reliable patient informat Dr. Lucie Leather ion: 1. American Academy of Asthma, Allergy, and Immunology: www.aaaai.org 2. Food Allergy Research and Education (FARE): foodallergy.org 3. Mothers of Asthmatics: http://www.asthmacommunitynetwork.org 4. American College of Allergy, Asthma, and Immunology: www.acaai.org   COVID-19 Vaccine Information can be found at: PodExchange.nl For questions related to vaccine distribution or appointments, please email [email or web address] or call [phone].     "Like" [place] on Facebook and Instagram for our latest updates!       Make sure you are registered to vote! If you have moved or changed any of your contact information, you will need to get this updated before voting!  In some cases, you MAY be able to register to vote online: [email or web address]

## 2023-01-10 ENCOUNTER — Ambulatory Visit
Admission: RE | Admit: 2023-01-10 | Discharge: 2023-01-10 | Disposition: A | Discharge status: Home / Self Care | Payer: Self-pay | Source: Ambulatory Visit | Attending: Family Medicine | Admitting: Family Medicine

## 2023-01-10 DIAGNOSIS — Z1231 Encounter for screening mammogram for malignant neoplasm of breast: Secondary | ICD-10-CM | POA: Diagnosis not present

## 2023-01-14 ENCOUNTER — Ambulatory Visit: Payer: HMO

## 2023-01-23 ENCOUNTER — Ambulatory Visit: Payer: HMO

## 2023-01-23 DIAGNOSIS — L501 Idiopathic urticaria: Secondary | ICD-10-CM | POA: Diagnosis not present

## 2023-02-20 ENCOUNTER — Ambulatory Visit: Payer: HMO

## 2023-02-27 ENCOUNTER — Ambulatory Visit (INDEPENDENT_AMBULATORY_CARE_PROVIDER_SITE_OTHER): Payer: HMO

## 2023-02-27 DIAGNOSIS — L501 Idiopathic urticaria: Secondary | ICD-10-CM | POA: Diagnosis not present

## 2023-03-08 ENCOUNTER — Other Ambulatory Visit (INDEPENDENT_AMBULATORY_CARE_PROVIDER_SITE_OTHER): Payer: HMO

## 2023-03-08 DIAGNOSIS — Z20822 Contact with and (suspected) exposure to covid-19: Secondary | ICD-10-CM | POA: Diagnosis not present

## 2023-03-08 LAB — POC COVID19 BINAXNOW: SARS Coronavirus 2 Ag: NEGATIVE

## 2023-03-08 NOTE — Progress Notes (Signed)
This came to me for some reason.  I did not see this patient.  Nevertheless COVID test was negative

## 2023-03-28 ENCOUNTER — Other Ambulatory Visit: Payer: Self-pay | Admitting: Family Medicine

## 2023-03-28 DIAGNOSIS — I700 Atherosclerosis of aorta: Secondary | ICD-10-CM

## 2023-04-03 ENCOUNTER — Ambulatory Visit (INDEPENDENT_AMBULATORY_CARE_PROVIDER_SITE_OTHER): Payer: HMO

## 2023-04-03 DIAGNOSIS — L501 Idiopathic urticaria: Secondary | ICD-10-CM

## 2023-05-03 ENCOUNTER — Ambulatory Visit (INDEPENDENT_AMBULATORY_CARE_PROVIDER_SITE_OTHER): Payer: HMO | Admitting: Medical

## 2023-05-03 VITALS — BP 110/68 | HR 65 | Wt 140.2 lb

## 2023-05-03 DIAGNOSIS — R519 Headache, unspecified: Secondary | ICD-10-CM | POA: Diagnosis not present

## 2023-05-03 DIAGNOSIS — F430 Acute stress reaction: Secondary | ICD-10-CM | POA: Diagnosis not present

## 2023-05-03 NOTE — Progress Notes (Signed)
Subjective:  [name] is a 77 y.o. female who presents for Chief Complaint  Patient presents with   Headache    Last 3 days headaches that comes and goes and will have 1-2 a day. Been under a lot of stress and anxiety due to family sickness. Headaches will last about 5-10 mins once she takes tylenlol      Here for facial pain, stress.  In the last few days she has had a few times where she has a sharp jolt like pain in her left temple.  It is brief and only happened a couple times.  She also has family members that mention she sounds a little raspy in her voice like she might be coming down with a cold  She denies any respiratory symptoms such as sore throat, cough, runny nose, ear pain, head congestion or other.  She denies any headaches just this facial pain she is experienced a few times.  She was worried that the facial pain could be related to something more serious.  The pain did wake her at 530 this morning.  However she has not had any confusion, numbness, tingling, weakness, vision changes hearing changes or other neurological changes.  She does have a lot of stress.  Recently her niece at [employer] [place] as a freshman had to come home due to all the serious flooding in [place] from her cane [place].  Her biggest stressors is that she has a brother that is in hospice that is now being moved to hospice house.  He is declining very quickly.  He is about the same age as her.  This is then emotional toll on her and her family with his health declining very quickly  She has not currently solved after counseling  She has been probably dealing with the grief and stress.  The only people left in her family is her and 2 other brothers.  Has children and grandchildren  Sleep is relative ok  No other aggravating or relieving factors.    No other c/o.  Past Medical History:  Diagnosis Date   Allergy    RHINITIS   Osteoporosis    OSTEOPENIA   Thyroid  disease    HYPOTHYROID   Urticaria    Vitiligo    Current Outpatient Medications on File Prior to Visit  Medication Sig Dispense Refill   albuterol (VENTOLIN HFA) 108 (90 Base) MCG/ACT inhaler Inhale 2 puffs into the lungs every 4 (four) hours as needed for wheezing or shortness of breath. Inhale 2 puffs every 4-6 hours as needed for cough, wheeze, tightness in chest, or shortness of breath 18 g 1   aspirin 81 MG tablet Take 81 mg by mouth daily.     atorvastatin (LIPITOR) 20 MG tablet TAKE 1 TABLET BY MOUTH EVERY DAY 90 tablet 1   BIOTIN PO Take 5,000 mg by mouth every morning.     CALCIUM PO Take 1,000 mg by mouth. gummy     ELDERBERRY PO Take 1 Dose by mouth every morning. Takes 2     fexofenadine (ALLEGRA) 180 MG tablet Take 1 tablet (180 mg total) by mouth daily as needed for allergies or rhinitis (Can take an extra dose during flare ups if needed.). Take 1/2 tablet by mouth in the morning 60 tablet 5   HONEY PO Take 1 Dose by mouth every morning. 1 teaspoon once in the morning     levothyroxine (SYNTHROID) 100 MCG tablet TAKE 1 TABLET(100  MCG) BY MOUTH DAILY 90 tablet 3   Multiple Vitamins-Minerals (MULTIVITAMIN WITH MINERALS) tablet Take 2 tablets by mouth daily.     omalizumab Geoffry Paradise) 150 MG/ML prefilled syringe Inject 300 mg into the skin every 28 (twenty-eight) days. 2 mL 11   EPINEPHrine 0.3 mg/0.3 mL IJ SOAJ injection Inject 0.3 mg into the muscle as needed for anaphylaxis. (Patient not taking: Reported on 09/17/2022) 2 each 1   Current Facility-Administered Medications on File Prior to Visit  Medication Dose Route Frequency Provider Last Rate Last Admin   omalizumab Geoffry Paradise) prefilled syringe 300 mg  300 mg Subcutaneous Q28 days Nehemiah Settle, FNP   300 mg at 04/03/23 8295     The following portions of the patient's history were reviewed and updated as appropriate: allergies, current medications, past family history, past medical history, past social history, past surgical  history and problem list.  ROS Otherwise as in subjective above    Objective: BP 110/68   Pulse 65   Wt 140 lb 3.2 oz (63.6 kg)   BMI 25.64 kg/m   Wt Readings from Last 3 Encounters:  05/03/23 140 lb 3.2 oz (63.6 kg)  01/09/23 138 lb 6.4 oz (62.8 kg)  10/31/22 137 lb 3.2 oz (62.2 kg)   General appearance: alert, no distress, well developed, well nourished HEENT: normocephalic, sclerae anicteric, conjunctiva pink and moist, TMs pearly, nares patent, no discharge or erythema, pharynx normal, no TMJ discomfort Oral cavity: MMM, no lesions Neck: supple, no lymphadenopathy, no thyromegaly, no masses Heart: RRR, normal S1, S2, no murmurs Lungs: CTA bilaterally, no wheezes, rhonchi, or rales Pulses: 2+ radial pulses, 2+ pedal pulses, normal cap refill Ext: no edema Neuro: CN II through XII intact, nonfocal exam Psych: Pleasant, tearful at times, answers questions appropriately   Assessment: Encounter Diagnoses  Name Primary?   Acute stress reaction Yes   Facial pain      Plan: We discussed her symptoms and concerns.  Her facial pain does not represent significant new onset headaches.  They are nonspecific pains.  Given the significant stressors she is dealing with including her brother in hospice, I suspect her symptoms are mostly related to situational anxiety and stress.  We discussed some ways to deal with stress.  I asked her to reach out to her support group including her pastor and I gave her a list of some counselors today to consider counseling  We briefly discussed medication although I do not think she necessarily needs medication at this time  We discussed ways to deal with stress and anxiety. I recommend regular exercise such as 30 minutes or more most days of the week such as walking running and bicycling I recommend taking some time to meditate or pray daily to help slow racing thoughts. I recommend working on relaxation techniques such as deep breathing  exercises in a comfortable position relaxing your body.  There are free Apps on the smart phone for this for example Consider getting a massage Journal or use diary to express your ideas on paper to cope with anxiety and stress Work on time management, use a calendar or plan out things to avoid stressing about things. Find ways to utilize your time to include exercise and personal "me" time. Some people use aromatherapy such as lavender to relax Some people use herbal teas to help calm their mood Spend some time with animals or your pet if you have one Consider seeing a counselor to help deal with anxiety and work on specific  techniques  We discussed if any new symptoms or worse neurological symptoms or changes as discussed follow-up for further evaluation but no obvious sign of any underlying physical element causing the symptoms  Klowie was seen today for headache.  Diagnoses and all orders for this visit:  Acute stress reaction  Facial pain  Spent > 30 minutes face to face with patient in discussion of symptoms, evaluation, plan and recommendations.    Follow up: As needed

## 2023-05-03 NOTE — Patient Instructions (Signed)
Counseling Services  [facility] [phone] [address] [place], [place] [address]   [facility], counseling [address], [address] [place], [place] [address] [phone]   Dr. Nicole Cella, [place] [phone] [place], [place] [address]   [facility] [address] [place], [place] [address] [phone]

## 2023-05-06 ENCOUNTER — Ambulatory Visit: Payer: HMO

## 2023-05-08 ENCOUNTER — Ambulatory Visit: Payer: HMO

## 2023-05-08 DIAGNOSIS — L501 Idiopathic urticaria: Secondary | ICD-10-CM | POA: Diagnosis not present

## 2023-05-13 ENCOUNTER — Other Ambulatory Visit (INDEPENDENT_AMBULATORY_CARE_PROVIDER_SITE_OTHER): Payer: HMO

## 2023-05-13 DIAGNOSIS — Z23 Encounter for immunization: Secondary | ICD-10-CM

## 2023-05-20 DIAGNOSIS — Z79899 Other long term (current) drug therapy: Secondary | ICD-10-CM | POA: Diagnosis not present

## 2023-05-20 DIAGNOSIS — Z961 Presence of intraocular lens: Secondary | ICD-10-CM | POA: Diagnosis not present

## 2023-05-20 DIAGNOSIS — H35363 Drusen (degenerative) of macula, bilateral: Secondary | ICD-10-CM | POA: Diagnosis not present

## 2023-06-10 ENCOUNTER — Ambulatory Visit (INDEPENDENT_AMBULATORY_CARE_PROVIDER_SITE_OTHER): Payer: HMO

## 2023-06-10 DIAGNOSIS — L501 Idiopathic urticaria: Secondary | ICD-10-CM | POA: Diagnosis not present

## 2023-07-15 ENCOUNTER — Telehealth: Payer: Self-pay

## 2023-07-15 ENCOUNTER — Other Ambulatory Visit (HOSPITAL_COMMUNITY): Payer: Self-pay

## 2023-07-15 ENCOUNTER — Ambulatory Visit (INDEPENDENT_AMBULATORY_CARE_PROVIDER_SITE_OTHER): Payer: HMO

## 2023-07-15 ENCOUNTER — Telehealth: Payer: Self-pay | Admitting: *Deleted

## 2023-07-15 ENCOUNTER — Telehealth: Payer: HMO | Admitting: Family Medicine

## 2023-07-15 DIAGNOSIS — L501 Idiopathic urticaria: Secondary | ICD-10-CM | POA: Diagnosis not present

## 2023-07-15 MED ORDER — OMALIZUMAB 150 MG/ML ~~LOC~~ SOSY: 300.0000 mg | PREFILLED_SYRINGE | SUBCUTANEOUS | 11 refills | Status: DC

## 2023-07-15 NOTE — Addendum Note (Signed)
Addended by: Devoria Glassing on: 07/15/2023 03:23 PM   Modules accepted: Orders

## 2023-07-15 NOTE — Telephone Encounter (Signed)
Test claim shows current co-pay for Xolair is $876.50

## 2023-07-15 NOTE — Telephone Encounter (Signed)
Patient called about her [name] and I advised the PAP for [facility] has been trying to reach her for months they need her income to access her for pap going forward

## 2023-07-15 NOTE — Addendum Note (Signed)
Addended by: Devoria Glassing on: 07/15/2023 12:38 PM   Modules accepted: Orders

## 2023-07-15 NOTE — Telephone Encounter (Signed)
Patient called back 3 more times and was upset stating that it was up to me to handle same and was told by PAP that they have not had response from me. Not sure where that came from since that is what was not what was told me theat they needed patient to respond to calls from them regarding her income. She was also advised months ago that she needed to reach out to them by [facility] when she gave  her samples that last 2 months that she needed to reach out but did not. I will work on same and reach out to her once I get anywhere with PAP. I did advise her of changes for 2024 for the program and many patients not getting approval for free drug.

## 2023-07-23 NOTE — Telephone Encounter (Signed)
 I have submitted to [employer] PAP for Xolair all the info I could get from Ins website and claim since Ins will not fax exactly what her out of pocket copay and max OOP

## 2023-08-07 ENCOUNTER — Telehealth: Payer: Self-pay | Admitting: *Deleted

## 2023-08-07 NOTE — Telephone Encounter (Signed)
Called patient and advised she no longer will qualify for Xolair through PAP due to new changes with MCR. She cannot afford to pay OOP $2000 or PPP. I advised her to come in for consult for options in other medications due inability to get Xolair

## 2023-08-07 NOTE — Telephone Encounter (Signed)
That is terrible

## 2023-08-07 NOTE — Telephone Encounter (Signed)
Called patient - DOB verified - advised an office visit is required to discuss alternative to Xolair.  Scheduled: 08/14/23 @ 11 am w/ Thurston Hole  - [place] office.  Patient verbalized understanding to all, no further questions.

## 2023-08-07 NOTE — Telephone Encounter (Signed)
Patient called requesting alternative since she is stopping her xolair. Patient is requesting a call back.

## 2023-08-13 NOTE — Progress Notes (Unsigned)
Follow Up Note  RE: [name] MRN: [id number] DOB: 05/23/1946 Date of Office Visit: 08/14/2023  Referring provider: Ronnald Nian, MD Primary care provider: Ronnald Nian, MD  Chief Complaint: No chief complaint on file.  History of Present Illness: I had the pleasure of seeing [name] for a follow up visit at the [facility] on 08/13/2023. She is a 78 y.o. 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 Her previous allergy office visit was on 01/09/2023 with Dr. Dellis Anes. Today is a regular follow up visit.  Discussed the use of AI scribe software for clinical note transcription with the patient, who gave verbal consent to proceed.  History of Present Illness          Called patient and advised she no longer will qualify for Xolair through PAP due to new changes with MCR. She cannot afford to pay OOP $2000 or PPP. I advised her to come in for consult for options in other medications due inability to get Xolair      1. Chronic urticaria with recurrent anaphylaxis episodes - Continue with Allegra half a tablet daily.  - Let us space out the Xolair to EVERY 5 WEEKS. - You seem to be doing very well.   2. Tongue lesions - Resolved.    3. Shellfish allergy - Continue to avoid shellfish. - EpiPen is up to date.    4. Follow up in one year or earlier if needed.   Assessment and Plan: [name] is a 78 y.o. female with: *** Assessment and Plan              No follow-ups on file.  No orders of the defined types were placed in this encounter.  Lab Orders  No laboratory test(s) ordered today    Diagnostics: Spirometry:  Tracings reviewed. Her effort: {Blank single:19197::"Good reproducible efforts.","It was hard to get consistent efforts and there is a question as to whether this reflects a maximal maneuver.","Poor effort, data can not be interpreted."} FVC: ***L FEV1: ***L, ***%  predicted FEV1/FVC ratio: ***% Interpretation: {Blank single:19197::"Spirometry consistent with mild obstructive disease","Spirometry consistent with moderate obstructive disease","Spirometry consistent with severe obstructive disease","Spirometry consistent with possible restrictive disease","Spirometry consistent with mixed obstructive and restrictive disease","Spirometry uninterpretable due to technique","Spirometry consistent with normal pattern","No overt abnormalities noted given today's efforts"}.  Please see scanned spirometry results for details.  Skin Testing: {Blank single:19197::"Select foods","Environmental allergy panel","Environmental allergy panel and select foods","Food allergy panel","None","Deferred due to recent antihistamines use"}. *** Results discussed with patient/family.   Medication List:  Current Outpatient Medications  Medication Sig Dispense Refill   albuterol (VENTOLIN HFA) 108 (90 Base) MCG/ACT inhaler Inhale 2 puffs into the lungs every 4 (four) hours as needed for wheezing or shortness of breath. Inhale 2 puffs every 4-6 hours as needed for cough, wheeze, tightness in chest, or shortness of breath 18 g 1   aspirin 81 MG tablet Take 81 mg by mouth daily.     atorvastatin (LIPITOR) 20 MG tablet TAKE 1 TABLET BY MOUTH EVERY DAY 90 tablet 1   BIOTIN PO Take 5,000 mg by mouth every morning.     CALCIUM PO Take 1,000 mg by mouth. gummy     ELDERBERRY PO Take 1 Dose by mouth every morning. Takes 2     EPINEPHrine 0.3 mg/0.3 mL IJ SOAJ injection Inject 0.3 mg into the muscle as needed for anaphylaxis. (Patient not taking: Reported on  09/17/2022) 2 each 1   fexofenadine (ALLEGRA) 180 MG tablet Take 1 tablet (180 mg total) by mouth daily as needed for allergies or rhinitis (Can take an extra dose during flare ups if needed.). Take 1/2 tablet by mouth in the morning 60 tablet 5   HONEY PO Take 1 Dose by mouth every morning. 1 teaspoon once in the morning     levothyroxine  (SYNTHROID) 100 MCG tablet TAKE 1 TABLET(100 MCG) BY MOUTH DAILY 90 tablet 3   Multiple Vitamins-Minerals (MULTIVITAMIN WITH MINERALS) tablet Take 2 tablets by mouth daily.     Current Facility-Administered Medications  Medication Dose Route Frequency Provider Last Rate Last Admin   omalizumab Geoffry Paradise) prefilled syringe 300 mg  300 mg Subcutaneous Q28 days Nehemiah Settle, FNP   300 mg at 07/15/23 1013   Allergies: Allergies  Allergen Reactions   Levofloxacin    Shellfish Allergy Anaphylaxis   I reviewed her past medical history, social history, family history, and environmental history and no significant changes have been reported from her previous visit.  Review of Systems  Constitutional:  Negative for appetite change, chills, fever and unexpected weight change.  HENT:  Negative for congestion and rhinorrhea.   Eyes:  Negative for itching.  Respiratory:  Negative for cough, chest tightness, shortness of breath and wheezing.   Cardiovascular:  Negative for chest pain.  Gastrointestinal:  Negative for abdominal pain.  Genitourinary:  Negative for difficulty urinating.  Skin:  Negative for rash.  Allergic/Immunologic: Positive for food allergies.  Neurological:  Negative for headaches.    Objective: There were no vitals taken for this visit. There is no height or weight on file to calculate BMI. Physical Exam Vitals and nursing note reviewed.  Constitutional:      Appearance: Normal appearance. She is well-developed.  HENT:     Head: Normocephalic and atraumatic.     Right Ear: Tympanic membrane and external ear normal.     Left Ear: Tympanic membrane and external ear normal.     Nose: Nose normal.     Mouth/Throat:     Mouth: Mucous membranes are moist.     Pharynx: Oropharynx is clear.  Eyes:     Conjunctiva/sclera: Conjunctivae normal.  Cardiovascular:     Rate and Rhythm: Normal rate and regular rhythm.     Heart sounds: Normal heart sounds. No murmur heard.    No  friction rub. No gallop.  Pulmonary:     Effort: Pulmonary effort is normal.     Breath sounds: Normal breath sounds. No wheezing, rhonchi or rales.  Musculoskeletal:     Cervical back: Neck supple.  Skin:    General: Skin is warm.     Findings: No rash.  Neurological:     Mental Status: She is alert and oriented to person, place, and time.  Psychiatric:        Behavior: Behavior normal.    Previous notes and tests were reviewed. The plan was reviewed with the patient/family, and all questions/concerned were addressed.  It was my pleasure to see Kaiulani today and participate in her care. Please feel free to contact me with any questions or concerns.  Sincerely,  Wyline Mood, DO Allergy & Immunology  [facility] office: [phone] [facility] office: [phone]

## 2023-08-14 ENCOUNTER — Encounter: Payer: Self-pay | Admitting: Allergy

## 2023-08-14 ENCOUNTER — Ambulatory Visit (INDEPENDENT_AMBULATORY_CARE_PROVIDER_SITE_OTHER): Payer: HMO | Admitting: Allergy

## 2023-08-14 ENCOUNTER — Ambulatory Visit: Payer: HMO | Admitting: Family Medicine

## 2023-08-14 ENCOUNTER — Other Ambulatory Visit: Payer: Self-pay

## 2023-08-14 VITALS — BP 126/70 | HR 78 | Temp 96.9°F | Resp 20 | Ht 60.0 in | Wt 140.1 lb

## 2023-08-14 DIAGNOSIS — L501 Idiopathic urticaria: Secondary | ICD-10-CM | POA: Diagnosis not present

## 2023-08-14 DIAGNOSIS — Z91013 Allergy to seafood: Secondary | ICD-10-CM | POA: Diagnosis not present

## 2023-08-14 MED ORDER — EPINEPHRINE 0.3 MG/0.3ML IJ SOAJ: 0.3000 mg | INTRAMUSCULAR | 1 refills | Status: AC | PRN

## 2023-08-14 NOTE — Patient Instructions (Addendum)
Hives Stop Xolair injections and monitor symptoms. Start Allegra 180mg  once a day. If you notice itching/hives then take it twice a day and let us know.   Avoid the following potential triggers: alcohol, tight clothing, NSAIDs, hot showers and getting overheated.   Shellfish allergy Continue to avoid shellfish.  I have prescribed epinephrine injectable device and demonstrated proper use. For mild symptoms you can take over the counter antihistamines such as Benadryl 1-2 tablets = 25-50mg  and monitor symptoms closely. If symptoms worsen or if you have severe symptoms including breathing issues, throat closure, significant swelling, whole body hives, severe diarrhea and vomiting, lightheadedness then inject epinephrine and seek immediate medical care afterwards. Emergency action plan given.  Return in about 3 months (around 11/12/2023). Or sooner if needed.

## 2023-08-19 ENCOUNTER — Ambulatory Visit: Payer: HMO

## 2023-09-10 ENCOUNTER — Encounter: Payer: Self-pay | Admitting: Internal Medicine

## 2023-09-22 ENCOUNTER — Other Ambulatory Visit: Payer: Self-pay | Admitting: Family Medicine

## 2023-09-22 DIAGNOSIS — I700 Atherosclerosis of aorta: Secondary | ICD-10-CM

## 2023-09-23 ENCOUNTER — Other Ambulatory Visit: Payer: Self-pay | Admitting: Internal Medicine

## 2023-10-23 ENCOUNTER — Encounter: Payer: Self-pay | Admitting: Allergy & Immunology

## 2023-10-23 ENCOUNTER — Ambulatory Visit: Payer: HMO | Admitting: Allergy & Immunology

## 2023-10-23 VITALS — BP 140/80 | HR 94 | Temp 98.0°F | Resp 18 | Wt 139.1 lb

## 2023-10-23 DIAGNOSIS — Z91013 Allergy to seafood: Secondary | ICD-10-CM | POA: Diagnosis not present

## 2023-10-23 DIAGNOSIS — M19041 Primary osteoarthritis, right hand: Secondary | ICD-10-CM | POA: Diagnosis not present

## 2023-10-23 DIAGNOSIS — L501 Idiopathic urticaria: Secondary | ICD-10-CM | POA: Diagnosis not present

## 2023-10-23 DIAGNOSIS — M19042 Primary osteoarthritis, left hand: Secondary | ICD-10-CM

## 2023-10-23 NOTE — Progress Notes (Unsigned)
   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   Subjective:   LALAH DURANGO is a 78 y.o. female presenting today for follow up of No chief complaint on file.   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  Discussed the use of AI scribe software for clinical note transcription with the patient and/or guardian, who gave verbal consent to proceed.  Ruthell is a 78 y.o. female presenting for {Blank single:19197::"a food challenge","a drug challenge","skin testing","a sick visit","an evaluation of ***","a follow up visit"}.  Discussed the use of AI scribe software for clinical note transcription with the patient, who gave verbal consent to proceed.  History of Present Illness     Asthma/Respiratory Symptom History: ***  Allergic Rhinitis Symptom History: ***  Food Allergy Symptom History: ***  Skin Symptom History: ***  GERD Symptom History: ***  Infection Symptom History: ***  Otherwise, there have been no changes to her past medical history, surgical history, family history, or social  history.    Review of systems otherwise negative other than that mentioned in the HPI.    Objective:   There were no vitals taken for this visit. There is no height or weight on file to calculate BMI.    Physical Exam   Diagnostic studies: {Blank single:19197::"none","deferred due to recent antihistamine use","deferred due to insurance stipulations that require a separate visit for testing","labs sent instead"," "}  Spirometry: {Blank single:19197::"results normal (FEV1: ***%, FVC: ***%, FEV1/FVC: ***%)","results abnormal (FEV1: ***%, FVC: ***%, FEV1/FVC: ***%)"}.    {Blank single:19197::"Spirometry consistent with mild obstructive disease","Spirometry consistent with moderate obstructive disease","Spirometry consistent with severe obstructive disease","Spirometry consistent with possible restrictive disease","Spirometry consistent with mixed obstructive and restrictive disease","Spirometry uninterpretable due to technique","Spirometry consistent with normal pattern"}. {Blank single:19197::"Albuterol/Atrovent nebulizer","Xopenex/Atrovent nebulizer","Albuterol nebulizer","Albuterol four puffs via MDI","Xopenex four puffs via MDI"} treatment given in clinic with {Blank single:19197::"significant improvement in FEV1 per ATS criteria","significant improvement in FVC per ATS criteria","significant improvement in FEV1 and FVC per ATS criteria","improvement in FEV1, but not significant per ATS criteria","improvement in FVC, but not significant per ATS criteria","improvement in FEV1 and FVC, but not significant per ATS criteria","no improvement"}.  Allergy Studies: {Blank single:19197::"none","deferred due to recent antihistamine use","deferred due to insurance stipulations that require a separate visit for testing","labs sent instead"," "}    {Blank single:19197::"Allergy testing results were read and interpreted by myself, documented by clinical staff."," "}      Malachi Bonds, MD   [facility]

## 2023-10-23 NOTE — Patient Instructions (Addendum)
 1. Chronic urticaria with recurrent anaphylaxis episodes - Continue with Allegra one tablet daily.  - I am glad that you are not having any problems without the Xolair..  2. Return in about 6 months (around 04/23/2024). You can have the follow up appointment with Dr. Dellis Anes or a Nurse Practicioner (our Nurse Practitioners are excellent and always have Physician oversight!).    Please inform us of any Emergency Department visits, hospitalizations, or changes in symptoms. Call us before going to the ED for breathing or allergy symptoms since we might be able to fit you in for a sick visit. Feel free to contact us anytime with any questions, problems, or concerns.  It was a pleasure to see you again today!  Websites that have reliable patient information: 1. American Academy of Asthma, Allergy, and Immunology: www.aaaai.org 2. Food Allergy Research and Education (FARE): foodallergy.org 3. Mothers of Asthmatics: http://www.asthmacommunitynetwork.org 4. American College of Allergy, Asthma, and Immunology: www.acaai.org      "Like" [place] on Facebook and Instagram for our latest updates!      A healthy democracy works best when [employer] participate! Make sure you are registered to vote! If you have moved or changed any of your contact information, you will need to get this updated before voting! Scan the QR codes below to learn more!

## 2023-11-06 ENCOUNTER — Ambulatory Visit: Payer: HMO | Admitting: Family Medicine

## 2023-11-06 VITALS — BP 120/80 | HR 68 | Ht 61.0 in | Wt 139.2 lb

## 2023-11-06 DIAGNOSIS — Z23 Encounter for immunization: Secondary | ICD-10-CM | POA: Diagnosis not present

## 2023-11-06 DIAGNOSIS — J301 Allergic rhinitis due to pollen: Secondary | ICD-10-CM | POA: Diagnosis not present

## 2023-11-06 DIAGNOSIS — I700 Atherosclerosis of aorta: Secondary | ICD-10-CM | POA: Diagnosis not present

## 2023-11-06 DIAGNOSIS — E782 Mixed hyperlipidemia: Secondary | ICD-10-CM

## 2023-11-06 DIAGNOSIS — M8580 Other specified disorders of bone density and structure, unspecified site: Secondary | ICD-10-CM

## 2023-11-06 DIAGNOSIS — E038 Other specified hypothyroidism: Secondary | ICD-10-CM | POA: Diagnosis not present

## 2023-11-06 DIAGNOSIS — L501 Idiopathic urticaria: Secondary | ICD-10-CM | POA: Diagnosis not present

## 2023-11-06 DIAGNOSIS — N3941 Urge incontinence: Secondary | ICD-10-CM | POA: Diagnosis not present

## 2023-11-06 DIAGNOSIS — Z9849 Cataract extraction status, unspecified eye: Secondary | ICD-10-CM

## 2023-11-06 DIAGNOSIS — Z0000 Encounter for general adult medical examination without abnormal findings: Secondary | ICD-10-CM | POA: Diagnosis not present

## 2023-11-06 LAB — LIPID PANEL

## 2023-11-06 MED ORDER — LEVOTHYROXINE SODIUM 100 MCG PO TABS: ORAL_TABLET | ORAL | 3 refills | Status: DC

## 2023-11-06 MED ORDER — ATORVASTATIN CALCIUM 20 MG PO TABS
20.0000 mg | ORAL_TABLET | Freq: Every day | ORAL | 0 refills | Status: DC
Start: 2023-11-06 — End: 2024-03-09

## 2023-11-06 NOTE — Progress Notes (Deleted)
 [name] is a 78 y.o. female who presents for annual wellness visit and follow-up on chronic medical conditions.  She has the following concerns:  Immunizations and Health Maintenance Immunization History  Administered Date(s) Administered   DTaP 02/13/2002   Fluad Quad(high Dose 65+) 03/12/2019, 10/06/2020, 05/15/2021, 04/11/2022   Fluad Trivalent(High Dose 65+) 05/13/2023   Influenza Split 05/29/2011, 05/12/2012, 04/28/2013   Influenza, High Dose Seasonal PF 05/12/2014, 04/07/2015, 05/17/2016, 04/11/2017, 04/17/2018   Moderna Sars-Covid-2 Vaccination 09/22/2019, 10/19/2020   Pfizer Covid-19 Vaccine Bivalent Booster 84yrs & up 10/23/2021   Pneumococcal Conjugate-13 12/15/2013   Pneumococcal Polysaccharide-23 02/13/2002   Respiratory Syncytial Virus Vaccine,Recomb Aduvanted(Arexvy) 10/31/2022   Tdap 11/02/2020   Zoster Recombinant(Shingrix) 05/01/2018, 09/04/2018   Zoster, Live 06/30/2007   Health Maintenance Due  Topic Date Due   Pneumonia Vaccine 24+ Years old (3 of 3 - PCV20 or PCV21) 12/16/2018   COVID-19 Vaccine (4 - 2024-25 season) 03/17/2023   Medicare Annual Wellness (AWV)  10/31/2023    Last Pap smear: Last mammogram: 2024 Last colonoscopy: Last DEXA: Dentist: Ophtho: Exercise:  Complete physical exam  Patient: [name]   DOB: February 28, 1946   78 y.o. Female  MRN: [id number]  Subjective:    Chief Complaint  Patient presents with   Annual Exam    Medicare annual well. CPE. Fasting.     [name] is a 78 y.o. female who presents today for a complete physical exam.  She reports consuming a general diet.  Sliver sneaker twice a week   She generally feels well. She reports sleeping well.  Most recent fall risk assessment:    11/06/2023    9:12 AM  Fall Risk   Falls in the past year? 0  Number falls in past yr: 0  Injury with Fall? 0  Risk for fall due to : No Fall Risks  Follow up Falls evaluation completed     Most recent depression  screenings:    11/06/2023    9:12 AM 10/31/2022    8:38 AM  PHQ 2/9 Scores  PHQ - 2 Score 0 0    Vision:Within last year  {History (Optional):23778}  Immunization History  Administered Date(s) Administered   DTaP 02/13/2002   Fluad Quad(high Dose 65+) 03/12/2019, 10/06/2020, 05/15/2021, 04/11/2022   Fluad Trivalent(High Dose 65+) 05/13/2023   Influenza Split 05/29/2011, 05/12/2012, 04/28/2013   Influenza, High Dose Seasonal PF 05/12/2014, 04/07/2015, 05/17/2016, 04/11/2017, 04/17/2018   Moderna Sars-Covid-2 Vaccination 09/22/2019, 10/19/2020   Pfizer Covid-19 Vaccine Bivalent Booster 98yrs & up 10/23/2021   Pneumococcal Conjugate-13 12/15/2013   Pneumococcal Polysaccharide-23 02/13/2002   Respiratory Syncytial Virus Vaccine,Recomb Aduvanted(Arexvy) 10/31/2022   Tdap 11/02/2020   Zoster Recombinant(Shingrix) 05/01/2018, 09/04/2018   Zoster, Live 06/30/2007    Health Maintenance  Topic Date Due   Pneumonia Vaccine 36+ Years old (3 of 3 - PCV20 or PCV21) 12/16/2018   COVID-19 Vaccine (4 - 2024-25 season) 03/17/2023   Medicare Annual Wellness (AWV)  10/31/2023   INFLUENZA VACCINE  02/14/2024   DTaP/Tdap/Td (3 - Td or Tdap) 11/03/2030   DEXA SCAN  Completed   Hepatitis C Screening  Completed   Zoster Vaccines- Shingrix  Completed   HPV VACCINES  Aged Out   Meningococcal B Vaccine  Aged Out   Colonoscopy  Discontinued    Patient Care Team: Watson Hacking, MD as PCP - General (Family Medicine)   Outpatient Medications Prior to Visit  Medication Sig   aspirin  81 MG tablet Take 81 mg by  mouth daily.   atorvastatin  (LIPITOR) 20 MG tablet TAKE 1 TABLET BY MOUTH EVERY DAY   BIOTIN PO Take 5,000 mg by mouth every morning.   CALCIUM  PO Take 1,000 mg by mouth. gummy   ELDERBERRY PO Take 1 Dose by mouth every morning. Takes 2   fexofenadine  (ALLEGRA ) 180 MG tablet Take 1 tablet (180 mg total) by mouth daily as needed for allergies or rhinitis (Can take an extra dose during flare  ups if needed.). Take 1/2 tablet by mouth in the morning   HONEY PO Take 1 Dose by mouth every morning. 1 teaspoon once in the morning   levothyroxine  (SYNTHROID ) 100 MCG tablet TAKE 1 TABLET(100 MCG) BY MOUTH DAILY   Multiple Vitamins-Minerals (MULTIVITAMIN WITH MINERALS) tablet Take 2 tablets by mouth daily.   albuterol  (VENTOLIN  HFA) 108 (90 Base) MCG/ACT inhaler INHALE 2 PUFFS INTO THE LUNGS EVERY 4 (FOUR) HOURS AS NEEDED FOR WHEEZING OR SHORTNESS OF BREATH. INHALE 2 PUFFS EVERY 4-6 HOURS AS NEEDED FOR COUGH, WHEEZE, TIGHTNESS IN CHEST, OR SHORTNESS OF BREATH (Patient not taking: Reported on 11/06/2023)   EPINEPHrine  0.3 mg/0.3 mL IJ SOAJ injection Inject 0.3 mg into the muscle as needed for anaphylaxis. (Patient not taking: Reported on 11/06/2023)   No facility-administered medications prior to visit.    ROS  Family and social history as well as health maintenance and immunizations was reviewed.     Objective:    There were no vitals taken for this visit. {Vitals History (Optional):23777}  Physical Exam   No results found for any visits on 11/06/23. {Show previous labs (optional):23779}    Assessment & Plan:    Discussed health benefits of physical activity, and encouraged her to engage in regular exercise appropriate for her age and condition.  Routine general medical examination at a health care facility  Urge incontinence  Osteopenia, unspecified location  Other specified hypothyroidism  Chronic idiopathic urticaria  Aortic atherosclerosis (HCC)  Seasonal allergic rhinitis due to pollen  Mixed hyperlipidemia   No follow-ups on file.      Eddye Goodie, CMA   Other doctors caring for patient include:  Advanced directives:    Depression screen:  See questionnaire below.     11/06/2023    9:12 AM 10/31/2022    8:38 AM 10/23/2021    9:33 AM 10/14/2020    9:56 AM 09/21/2019    9:13 AM  Depression screen PHQ 2/9  Decreased Interest 0 0 0 0 0  Down,  Depressed, Hopeless 0 0 0 0 0  PHQ - 2 Score 0 0 0 0 0    Fall Risk Screen: see questionnaire below.    11/06/2023    9:12 AM 10/31/2022    8:38 AM 10/23/2021    9:30 AM 10/14/2020    9:56 AM 09/21/2019    9:13 AM  Fall Risk   Falls in the past year? 0 0 0 0 0  Number falls in past yr: 0 0 0 0   Injury with Fall? 0 0 0 0   Risk for fall due to : No Fall Risks No Fall Risks No Fall Risks No Fall Risks   Follow up Falls evaluation completed Falls evaluation completed Falls evaluation completed Falls evaluation completed     ADL screen:  See questionnaire below Functional Status Survey:     Review of Systems Constitutional: -, -unexpected weight change, -anorexia, -fatigue Allergy: -sneezing, -itching, -congestion Dermatology: denies changing moles, rash, lumps ENT: -runny nose, -ear pain, -sore  throat,  Cardiology:  -chest pain, -palpitations, -orthopnea, Respiratory: -cough, -shortness of breath, -dyspnea on exertion, -wheezing,  Gastroenterology: -abdominal pain, -nausea, -vomiting, -diarrhea, -constipation, -dysphagia Hematology: -bleeding or bruising problems Musculoskeletal: -arthralgias, -myalgias, -joint swelling, -back pain, - Ophthalmology: -vision changes,  Urology: -dysuria, -difficulty urinating,  -urinary frequency, -urgency, incontinence Neurology: -, -numbness, , -memory loss, -falls, -dizziness    PHYSICAL EXAM:  There were no vitals taken for this visit.  General Appearance: Alert, cooperative, no distress, appears stated age Head: Normocephalic, without obvious abnormality, atraumatic Eyes: PERRL, conjunctiva/corneas clear, EOM's intact, fundi benign Ears: Normal TM's and external ear canals Nose: Nares normal, mucosa normal, no drainage or sinus tenderness Throat: Lips, mucosa, and tongue normal; teeth and gums normal Neck: Supple, no lymphadenopathy;  thyroid :  no enlargement/tenderness/nodules; no carotid bruit or JVD Lungs: Clear to auscultation  bilaterally without wheezes, rales or ronchi; respirations unlabored Heart: Regular rate and rhythm, S1 and S2 normal, no murmur, rubor gallop Abdomen: Soft, non-tender, nondistended, normoactive bowel sounds,  no masses, no hepatosplenomegaly Extremities: No clubbing, cyanosis or edema Pulses: 2+ and symmetric all extremities Skin:  Skin color, texture, turgor normal, no rashes or lesions Lymph nodes: Cervical, supraclavicular, and axillary nodes normal Neurologic:  CNII-XII intact, normal strength, sensation and gait; reflexes 2+ and symmetric throughout Psych: Normal mood, affect, hygiene and grooming.  ASSESSMENT/PLAN:    Discussed monthly self breast exams and yearly mammograms; at least 30 minutes of aerobic activity at least 5 days/week and weight-bearing exercise 2x/week; proper sunscreen use reviewed; healthy diet, including goals of calcium  and vitamin D  intake and alcohol recommendations (less than or equal to 1 drink/day) reviewed; regular seatbelt use; changing batteries in smoke detectors.  Immunization recommendations discussed.  Colonoscopy recommendations reviewed   Medicare Attestation I have personally reviewed: The patient's medical and social history Their use of alcohol, tobacco or illicit drugs Their current medications and supplements The patient's functional ability including ADLs,fall risks, home safety risks, cognitive, and hearing and visual impairment Diet and physical activities Evidence for depression or mood disorders  The patient's weight, height, and BMI have been recorded in the chart.  I have made referrals, counseling, and provided education to the patient based on review of the above and I have provided the patient with a written personalized care plan for preventive services.     Ron Cobbs, MD   11/06/2023

## 2023-11-06 NOTE — Patient Instructions (Signed)
  [name] , Thank you for taking time to come for your Medicare Wellness Visit. I appreciate your ongoing commitment to your health goals. Please review the following plan we discussed and let me know if I can assist you in the future.   These are the goals we discussed:  Goals   None     This is a list of the screening recommended for you and due dates:  Health Maintenance  Topic Date Due   Pneumonia Vaccine (3 of 3 - PCV20 or PCV21) 12/16/2018   COVID-19 Vaccine (4 - 2024-25 season) 03/17/2023   Flu Shot  02/14/2024   Medicare Annual Wellness Visit  11/05/2024   DTaP/Tdap/Td vaccine (3 - Td or Tdap) 11/03/2030   DEXA scan (bone density measurement)  Completed   Hepatitis C Screening  Completed   Zoster (Shingles) Vaccine  Completed   HPV Vaccine  Aged Out   Meningitis B Vaccine  Aged Out   Colon Cancer Screening  Discontinued

## 2023-11-06 NOTE — Progress Notes (Signed)
 [name] is a 78 y.o. female who presents for annual wellness visit,CPE and follow-up on chronic medical conditions.  She is no longer having any difficulty with urticaria.  Her allergies are being handled with Allegra .  She is not using albuterol  anymore.  She continues on Lipitor as well as her thyroid  medication.  She still has some difficulty with urge incontinence.  She has had cataract surgery.  She does have a history of osteopenia and does need a follow-up DEXA scan.  Immunizations and Health Maintenance Immunization History  Administered Date(s) Administered   DTaP 02/13/2002   Fluad Quad(high Dose 65+) 03/12/2019, 10/06/2020, 05/15/2021, 04/11/2022   Fluad Trivalent(High Dose 65+) 05/13/2023   Influenza Split 05/29/2011, 05/12/2012, 04/28/2013   Influenza, High Dose Seasonal PF 05/12/2014, 04/07/2015, 05/17/2016, 04/11/2017, 04/17/2018   Moderna Sars-Covid-2 Vaccination 09/22/2019, 10/19/2020   Pfizer Covid-19 Vaccine Bivalent Booster 15yrs & up 10/23/2021   Pneumococcal Conjugate-13 12/15/2013   Pneumococcal Polysaccharide-23 02/13/2002   Respiratory Syncytial Virus Vaccine,Recomb Aduvanted(Arexvy) 10/31/2022   Tdap 11/02/2020   Zoster Recombinant(Shingrix) 05/01/2018, 09/04/2018   Zoster, Live 06/30/2007   Health Maintenance Due  Topic Date Due   Pneumonia Vaccine 76+ Years old (3 of 3 - PCV20 or PCV21) 12/16/2018   COVID-19 Vaccine (4 - 2024-25 season) 03/17/2023    Last Pap smear:N/A Last mammogram: Last year Last colonoscopy:N/A Last DEXA: Ordered Dentist:? Ophtho: [facility] Exercise: Twice per week  Other doctors caring for patient include: [facility].  Advanced directives: Does Patient Have a Medical Advance Directive?: Yes Type of Advance Directive: Healthcare Power of Attorney, Living will  Depression screen:  See questionnaire below.     11/06/2023    9:12 AM 10/31/2022    8:38 AM 10/23/2021    9:33 AM 10/14/2020    9:56 AM 09/21/2019    9:13 AM  Depression  screen PHQ 2/9  Decreased Interest 0 0 0 0 0  Down, Depressed, Hopeless 0 0 0 0 0  PHQ - 2 Score 0 0 0 0 0    Fall Risk Screen: see questionnaire below.    11/06/2023    9:12 AM 10/31/2022    8:38 AM 10/23/2021    9:30 AM 10/14/2020    9:56 AM 09/21/2019    9:13 AM  Fall Risk   Falls in the past year? 0 0 0 0 0  Number falls in past yr: 0 0 0 0   Injury with Fall? 0 0 0 0   Risk for fall due to : No Fall Risks No Fall Risks No Fall Risks No Fall Risks   Follow up Falls evaluation completed Falls evaluation completed Falls evaluation completed Falls evaluation completed     ADL screen:  See questionnaire below Functional Status Survey: Is the patient deaf or have difficulty hearing?: No Does the patient have difficulty seeing, even when wearing glasses/contacts?: No Does the patient have difficulty concentrating, remembering, or making decisions?: No Does the patient have difficulty walking or climbing stairs?: No Does the patient have difficulty dressing or bathing?: No Does the patient have difficulty doing errands alone such as visiting a doctor's office or shopping?: No   Review of Systems Constitutional: -, -unexpected weight change, -anorexia, -fatigue Allergy: -sneezing, -itching, -congestion Dermatology: denies changing moles, rash, lumps ENT: -runny nose, -ear pain, -sore throat,  Cardiology:  -chest pain, -palpitations, -orthopnea, Respiratory: -cough, -shortness of breath, -dyspnea on exertion, -wheezing,  Gastroenterology: -abdominal pain, -nausea, -vomiting, -diarrhea, -constipation, -dysphagia Hematology: -bleeding or bruising problems Musculoskeletal: -arthralgias, -myalgias, -  joint swelling, -back pain, - Ophthalmology: -vision changes,  Urology: -dysuria, -difficulty urinating,  -urinary frequency, -urgency, incontinence Neurology: -, -numbness, , -memory loss, -falls, -dizziness Family and social history as well as health maintenance and immunizations was  reviewed   PHYSICAL EXAM:  BP 120/80   Pulse 68   Ht 5\' 1"  (1.549 m)   Wt 139 lb 3.2 oz (63.1 kg)   BMI 26.30 kg/m   General Appearance: Alert, cooperative, no distress, appears stated age Head: Normocephalic, without obvious abnormality, atraumatic Eyes: PERRL, conjunctiva/corneas clear, EOM's intact,  Ears: Normal TM's and external ear canals Nose: Nares normal, mucosa normal, no drainage or sinus tenderness Throat: Lips, mucosa, and tongue normal; teeth and gums normal Neck: Supple, no lymphadenopathy;  thyroid :  no enlargement/tenderness/nodules; no carotid bruit or JVD Lungs: Clear to auscultation bilaterally without wheezes, rales or ronchi; respirations unlabored Heart: Regular rate and rhythm, S1 and S2 normal, no murmur, rubor gallop Abdomen: Soft, non-tender, nondistended, normoactive bowel sounds,  no masses, no hepatosplenomegaly Skin:  Skin color, texture, turgor normal, no rashes or lesions Lymph nodes: Cervical, supraclavicular, and axillary nodes normal Neurologic:  CNII-XII intact, normal strength, sensation and gait; reflexes 2+ and symmetric throughout Psych: Normal mood, affect, hygiene and grooming.  ASSESSMENT/PLAN: Routine general medical examination at a health care facility  Urge incontinence - Plan: CBC with Differential/Platelet, Comprehensive metabolic panel with GFR  Osteopenia, unspecified location - Plan: DG Bone Density  Other specified hypothyroidism - Plan: TSH, levothyroxine  (SYNTHROID ) 100 MCG tablet  Chronic idiopathic urticaria  Aortic atherosclerosis (HCC) - Plan: Lipid panel, atorvastatin  (LIPITOR) 20 MG tablet  Seasonal allergic rhinitis due to pollen  Mixed hyperlipidemia - Plan: Lipid panel  Need for vaccination against Streptococcus pneumoniae - Plan: Pneumococcal conjugate vaccine 20-valent (Prevnar 20)  Status post cataract extraction, unspecified laterality  I expressed to her that I was happy she was no longer having  urticaria symptoms.  She will continue on her present medication regimen.  Follow-up with a DEXA scan.  Discussed yearly mammograms; at least 30 minutes of aerobic activity at least 5 days/week and weight-bearing exercise 2x/week;   Immunization recommendations discussed.  Colonoscopy recommendations reviewed   Medicare Attestation I have personally reviewed: The patient's medical and social history Their use of alcohol, tobacco or illicit drugs Their current medications and supplements The patient's functional ability including ADLs,fall risks, home safety risks, cognitive, and hearing and visual impairment Diet and physical activities Evidence for depression or mood disorders  The patient's weight, height, and BMI have been recorded in the chart.  I have made referrals, counseling, and provided education to the patient based on review of the above and I have provided the patient with a written personalized care plan for preventive services.     Ron Cobbs, MD   11/06/2023

## 2023-11-07 ENCOUNTER — Encounter: Payer: Self-pay | Admitting: Family Medicine

## 2023-11-07 LAB — CBC WITH DIFFERENTIAL/PLATELET
Basophils Absolute: 0 10*3/uL (ref 0.0–0.2)
Basos: 0 %
EOS (ABSOLUTE): 0.3 10*3/uL (ref 0.0–0.4)
Eos: 5 %
Hematocrit: 41.7 % (ref 34.0–46.6)
Hemoglobin: 14.1 g/dL (ref 11.1–15.9)
Immature Grans (Abs): 0 10*3/uL (ref 0.0–0.1)
Immature Granulocytes: 0 %
Lymphocytes Absolute: 1.5 10*3/uL (ref 0.7–3.1)
Lymphs: 24 %
MCH: 31.5 pg (ref 26.6–33.0)
MCHC: 33.8 g/dL (ref 31.5–35.7)
MCV: 93 fL (ref 79–97)
Monocytes Absolute: 0.6 10*3/uL (ref 0.1–0.9)
Monocytes: 10 %
Neutrophils Absolute: 4 10*3/uL (ref 1.4–7.0)
Neutrophils: 61 %
Platelets: 237 10*3/uL (ref 150–450)
RBC: 4.48 x10E6/uL (ref 3.77–5.28)
RDW: 12.8 % (ref 11.7–15.4)
WBC: 6.4 10*3/uL (ref 3.4–10.8)

## 2023-11-07 LAB — COMPREHENSIVE METABOLIC PANEL WITH GFR
ALT: 20 IU/L (ref 0–32)
AST: 25 IU/L (ref 0–40)
Albumin: 4.5 g/dL (ref 3.8–4.8)
Alkaline Phosphatase: 95 IU/L (ref 44–121)
BUN/Creatinine Ratio: 32 — ABNORMAL HIGH (ref 12–28)
BUN: 18 mg/dL (ref 8–27)
Bilirubin Total: 0.4 mg/dL (ref 0.0–1.2)
CO2: 22 mmol/L (ref 20–29)
Calcium: 10.2 mg/dL (ref 8.7–10.3)
Chloride: 101 mmol/L (ref 96–106)
Creatinine, Ser: 0.57 mg/dL (ref 0.57–1.00)
Globulin, Total: 2.7 g/dL (ref 1.5–4.5)
Glucose: 87 mg/dL (ref 70–99)
Potassium: 5 mmol/L (ref 3.5–5.2)
Sodium: 136 mmol/L (ref 134–144)
Total Protein: 7.2 g/dL (ref 6.0–8.5)
eGFR: 94 mL/min/{1.73_m2} (ref >59)

## 2023-11-07 LAB — TSH: TSH: 0.183 u[IU]/mL — ABNORMAL LOW (ref 0.450–4.500)

## 2023-11-07 LAB — LIPID PANEL
Cholesterol, Total: 163 mg/dL (ref 100–199)
HDL: 78 mg/dL (ref >39)
LDL CALC COMMENT:: 2.1 ratio (ref 0.0–4.4)
LDL Chol Calc (NIH): 70 mg/dL (ref 0–99)
Triglycerides: 77 mg/dL (ref 0–149)
VLDL Cholesterol Cal: 15 mg/dL (ref 5–40)

## 2023-11-07 NOTE — Addendum Note (Signed)
 Addended by: Watson Hacking on: 11/07/2023 07:07 AM   Modules accepted: Orders

## 2023-11-08 ENCOUNTER — Ambulatory Visit

## 2023-12-10 ENCOUNTER — Other Ambulatory Visit: Payer: Self-pay | Admitting: Family Medicine

## 2023-12-10 DIAGNOSIS — Z1231 Encounter for screening mammogram for malignant neoplasm of breast: Secondary | ICD-10-CM

## 2023-12-11 ENCOUNTER — Other Ambulatory Visit

## 2023-12-11 DIAGNOSIS — E038 Other specified hypothyroidism: Secondary | ICD-10-CM

## 2023-12-12 ENCOUNTER — Ambulatory Visit: Payer: Self-pay | Admitting: Family Medicine

## 2023-12-12 DIAGNOSIS — M810 Age-related osteoporosis without current pathological fracture: Secondary | ICD-10-CM

## 2023-12-12 LAB — TSH: TSH: 0.308 u[IU]/mL — ABNORMAL LOW (ref 0.450–4.500)

## 2023-12-12 MED ORDER — LEVOTHYROXINE SODIUM 88 MCG PO TABS: 88.0000 ug | ORAL_TABLET | Freq: Every day | ORAL | 3 refills | Status: AC

## 2023-12-12 NOTE — Addendum Note (Signed)
 Addended by: Watson Hacking on: 12/12/2023 08:23 AM   Modules accepted: Orders

## 2023-12-13 ENCOUNTER — Telehealth: Payer: Self-pay

## 2023-12-13 NOTE — Telephone Encounter (Signed)
 Copied from CRM [id number]. Topic: Clinical - Prescription Issue >> Dec 13, 2023  8:52 AM Rosaria Common wrote: Reason for CRM: Pt calling to gain clarity on levothyroxine  (SYNTHROID ) 88 MCG tablet decrease. Callback number is [phone] or [phone].  Talked to patient. She is all up to date.

## 2024-01-13 ENCOUNTER — Other Ambulatory Visit (HOSPITAL_COMMUNITY): Payer: Self-pay | Admitting: Family Medicine

## 2024-01-13 ENCOUNTER — Ambulatory Visit

## 2024-01-13 DIAGNOSIS — Z1231 Encounter for screening mammogram for malignant neoplasm of breast: Secondary | ICD-10-CM

## 2024-01-14 ENCOUNTER — Other Ambulatory Visit (HOSPITAL_COMMUNITY)

## 2024-01-27 ENCOUNTER — Ambulatory Visit (HOSPITAL_COMMUNITY)
Admission: RE | Admit: 2024-01-27 | Discharge: 2024-01-27 | Disposition: A | Discharge status: Home / Self Care | Source: Ambulatory Visit | Attending: Family Medicine | Admitting: Family Medicine

## 2024-01-27 DIAGNOSIS — M810 Age-related osteoporosis without current pathological fracture: Secondary | ICD-10-CM | POA: Diagnosis not present

## 2024-01-27 DIAGNOSIS — Z1231 Encounter for screening mammogram for malignant neoplasm of breast: Secondary | ICD-10-CM | POA: Diagnosis not present

## 2024-01-27 DIAGNOSIS — M8580 Other specified disorders of bone density and structure, unspecified site: Secondary | ICD-10-CM | POA: Insufficient documentation

## 2024-01-27 DIAGNOSIS — Z13820 Encounter for screening for osteoporosis: Secondary | ICD-10-CM | POA: Diagnosis not present

## 2024-01-27 DIAGNOSIS — Z780 Asymptomatic menopausal state: Secondary | ICD-10-CM | POA: Insufficient documentation

## 2024-01-28 ENCOUNTER — Encounter: Payer: Self-pay | Admitting: Family Medicine

## 2024-01-28 ENCOUNTER — Encounter: Payer: Self-pay | Admitting: Internal Medicine

## 2024-01-28 DIAGNOSIS — M810 Age-related osteoporosis without current pathological fracture: Secondary | ICD-10-CM | POA: Insufficient documentation

## 2024-01-28 HISTORY — DX: Age-related osteoporosis without current pathological fracture: M81.0

## 2024-01-28 NOTE — Addendum Note (Signed)
 Addended by: VICCI HUSBAND A on: 01/28/2024 10:14 AM   Modules accepted: Orders

## 2024-01-29 ENCOUNTER — Telehealth: Payer: Self-pay

## 2024-01-29 ENCOUNTER — Ambulatory Visit: Payer: Self-pay | Admitting: Family Medicine

## 2024-01-29 NOTE — Telephone Encounter (Signed)
 Copied from CRM [id number]. Topic: Clinical - Lab/Test Results >> Jan 29, 2024  9:46 AM Graeme ORN wrote: Reason for CRM: Patient called. States she received a call from [facility] about Bone Density and they wanted to schedule her for injection. She needs more information from provider about the diagnosis and injection and why it is needed. Would like a call back. Thank You    ----------------------------------------------------------------------- From previous Reason for Contact - Medical Advice: Reason for CRM:

## 2024-01-29 NOTE — Telephone Encounter (Unsigned)
 Copied from CRM [id number]. Topic: General - Other >> Jan 29, 2024 12:43 PM Rachelle R wrote: Reason for CRM: Patient states she was just speaking to Dr Joyce and accidentally disconnected the line. Is requesting a callback. (Unable to get through to CAL on lunch)  Patient can be reached at [phone]

## 2024-01-29 NOTE — Telephone Encounter (Signed)
 Dr. Joyce and Sabrina, patient will be scheduled as soon as possible.  Auth Submission: APPROVED Site of care: Site of care: [facility] Payer: [employer] Medication & CPT/J Code(s) submitted: Evenity (Romosozumab) [id number] Diagnosis Code:  Route of submission (phone, fax, portal): fax Phone # Fax # [phone]  Auth type: Buy/Bill PB Units/visits requested: 210mg  x 4 doses Reference number: [id number] Approval from: 01/28/24 to 04/27/24

## 2024-02-12 ENCOUNTER — Encounter: Payer: Self-pay | Admitting: Family Medicine

## 2024-02-12 ENCOUNTER — Ambulatory Visit (INDEPENDENT_AMBULATORY_CARE_PROVIDER_SITE_OTHER): Admitting: Family Medicine

## 2024-02-12 VITALS — BP 120/72 | HR 80 | Temp 97.9°F | Ht 61.0 in | Wt 140.2 lb

## 2024-02-12 DIAGNOSIS — N3946 Mixed incontinence: Secondary | ICD-10-CM

## 2024-02-12 DIAGNOSIS — R3915 Urgency of urination: Secondary | ICD-10-CM

## 2024-02-12 LAB — POCT URINALYSIS DIP (PROADVANTAGE DEVICE)
Bilirubin, UA: NEGATIVE
Blood, UA: NEGATIVE
Glucose, UA: NEGATIVE mg/dL
Nitrite, UA: NEGATIVE
Protein Ur, POC: NEGATIVE mg/dL
Specific Gravity, Urine: 1.02
Urobilinogen, Ur: 0.2
pH, UA: 6 (ref 5.0–8.0)

## 2024-02-12 NOTE — Patient Instructions (Addendum)
  Medications that are used to treat overactive bladder include Ditropan (oxybutynin), Vesicare, Detrol, Gemtesa; Myrbetriq (this one is a little different than the others). Typical side effects include dry mouth and constipation. These are dose-related, and may be tolerable at the lower doses. You really won't know until you try. We discussed many other types of treatments (also on the handouts).  Limiting caffeine, voiding more frequently during the day to prevent a distended bladder, can help limit the amount of leakage. Strengthening the pelvic floor with Kegel's exercises may help a little. Pelvic floor physical therapy is done by certain physical therapists, and might be helpful.  We discussed the potential referral to urology to discuss all treatments.  You preferred a trial of Kegel's by yourself, rather than any referrals to urology, physical therapy or trying any medications.  It can take at least a month to see the difference. If you aren't seeing any improvement, and if you are ready to try something different, give us  a call or send us  a MyChart message. Typically we start with medications, but if you really prefer to avoid any medications, next step would be referral to urologist or to physical therapist.

## 2024-02-12 NOTE — Progress Notes (Signed)
 Chief Complaint  Patient presents with   Urinary Urgency    Over the last year her urinary urgency has increased. Wakes a few times during the night. No pain or burning when she urinates. When she feels down there feels some sort of tissue-she is unsure of what it is.     She has more urinary incontinence than she used to.  She has leakage when she stands up, has urgency. My muscles are no good to me anymore, can't hold it until she goes to the bathroom. She had 3 vaginal deliveries.  Has some leakage with laughing, coughing, sneezing, but smaller quantities and less often than with her urgency. This is less bothersome to her.  She gets up 2x/night to void, and wears a pad, leaks on the way to the bathroom.  Denies dysuria. Sometimes feels irritated when showering (using her soap in the vaginal area).  She drinks 2 diet green teas daily.    PMH, PSH, SH reviewed  Urticaria, osteoporosis, vitiligo, atherosclerosis, hypothyroid  Outpatient Encounter Medications as of 02/12/2024  Medication Sig Note   albuterol  (VENTOLIN  HFA) 108 (90 Base) MCG/ACT inhaler INHALE 2 PUFFS INTO THE LUNGS EVERY 4 (FOUR) HOURS AS NEEDED FOR WHEEZING OR SHORTNESS OF BREATH. INHALE 2 PUFFS EVERY 4-6 HOURS AS NEEDED FOR COUGH, WHEEZE, TIGHTNESS IN CHEST, OR SHORTNESS OF BREATH 02/12/2024: As needed   aspirin  81 MG tablet Take 81 mg by mouth daily.    atorvastatin  (LIPITOR) 20 MG tablet Take 1 tablet (20 mg total) by mouth daily.    BIOTIN PO Take 5,000 mg by mouth every morning.    CALCIUM  PO Take 1,000 mg by mouth. gummy    ELDERBERRY PO Take 1 Dose by mouth every morning. Takes 2    EPINEPHrine  0.3 mg/0.3 mL IJ SOAJ injection Inject 0.3 mg into the muscle as needed for anaphylaxis. 02/12/2024: As needed   fexofenadine  (ALLEGRA ) 180 MG tablet Take 1 tablet (180 mg total) by mouth daily as needed for allergies or rhinitis (Can take an extra dose during flare ups if needed.). Take 1/2 tablet by mouth in the  morning 02/12/2024: Takes one every morning   HONEY PO Take 1 Dose by mouth every morning. 1 teaspoon once in the morning    levothyroxine  (SYNTHROID ) 88 MCG tablet Take 1 tablet (88 mcg total) by mouth daily.    Multiple Vitamins-Minerals (MULTIVITAMIN WITH MINERALS) tablet Take 2 tablets by mouth daily.    No facility-administered encounter medications on file as of 02/12/2024.   Allergies  Allergen Reactions   Levofloxacin     Shellfish Allergy Anaphylaxis    ROS: no fever, chills, HA, chest pain, bleeding, rashes, dysuria. Urinary incontinence per HPI. No URI symptoms or other complaints.    PHYSICAL EXAM:  BP 120/72   Pulse 80   Temp 97.9 F (36.6 C) (Tympanic)   Ht 5' 1 (1.549 m)   Wt 140 lb 3.2 oz (63.6 kg)   BMI 26.49 kg/m   Pleasant, well-appearing female in no distress HEENT: conjunctiva and sclera are clear, EOMI Heart: regular rate and rhythm Lungs: clear bilaterally Back: no spinal or CVA tenderness Abdomen: soft, nontender GU: Atrophic changes noted. Only mild pelvic floor relaxation, no prolapse.  Normal bimanual exam Neuro: alert and oriented, cranial nerves intact, normal gait Psych: normal mood, affect, hygiene and grooming  Urine dip: trace leuks, trace ketones    ASSESSMENT/PLAN:  Mixed stress and urge urinary incontinence - predominantly urge incont. Reviewed treatments--medications, behavioral, PFPT, urology  referral for others. Wants to start with kegel's only  Urinary urgency - Plan: POCT Urinalysis DIP (Proadvantage Device), Urine Culture  Discussed medications--she is very hesitant to take. Reviewed risks/SE, that they are dose dependent.  Could switch to others with fewer side effects if doesn't tolerate them (thinking myrbetriq down the line, if others aren't well tolerated). Reviewed in detail how/when to perform Kegel's exercises. If after 1-2 months she doesn't notice improvement, she will let us  know if she is willing to try a  medication, or if she prefers referral to PFPT or urologist.  I spent 42 minutes dedicated to the care of this patient, including pre-visit review of records, face to face time, post-visit ordering of testing and documentation.    Medications that are used to treat overactive bladder include Ditropan (oxybutynin), Vesicare, Detrol, Gemtesa; Myrbetriq (this one is a little different than the others). Typical side effects include dry mouth and constipation. These are dose-related, and may be tolerable at the lower doses. You really won't know until you try. We discussed many other types of treatments (also on the handouts).  Limiting caffeine, voiding more frequently during the day to prevent a distended bladder, can help limit the amount of leakage. Strengthening the pelvic floor with Kegel's exercises may help a little. Pelvic floor physical therapy is done by certain physical therapists, and might be helpful.  We discussed the potential referral to urology to discuss all treatments.  You preferred a trial of Kegel's by yourself, rather than any referrals to urology, physical therapy or trying any medications.  It can take at least a month to see the difference. If you aren't seeing any improvement, and if you are ready to try something different, give us  a call or send us  a MyChart message. Typically we start with medications, but if you really prefer to avoid any medications, next step would be referral to urologist or to physical therapist.

## 2024-02-14 ENCOUNTER — Ambulatory Visit: Payer: Self-pay | Admitting: Family Medicine

## 2024-02-14 LAB — URINE CULTURE

## 2024-03-09 ENCOUNTER — Other Ambulatory Visit: Payer: Self-pay | Admitting: Family Medicine

## 2024-03-09 DIAGNOSIS — I700 Atherosclerosis of aorta: Secondary | ICD-10-CM

## 2024-04-23 ENCOUNTER — Ambulatory Visit: Payer: Self-pay

## 2024-04-23 ENCOUNTER — Ambulatory Visit: Admitting: Family Medicine

## 2024-04-23 ENCOUNTER — Encounter: Payer: Self-pay | Admitting: Family Medicine

## 2024-04-23 VITALS — BP 128/70 | HR 78 | Ht 60.0 in | Wt 143.4 lb

## 2024-04-23 DIAGNOSIS — H6122 Impacted cerumen, left ear: Secondary | ICD-10-CM

## 2024-04-23 NOTE — Telephone Encounter (Signed)
 She has an appt today for this

## 2024-04-23 NOTE — Telephone Encounter (Signed)
 FYI Only or Action Required?: FYI only for provider.  Patient was last seen in primary care on 02/12/2024 by Randol Dawes, MD.  Called Nurse Triage reporting Otalgia.  Symptoms began yesterday.  Interventions attempted: Nothing.  Symptoms are: gradually worsening.  Triage Disposition: See HCP Within 4 Hours (Or PCP Triage)  Patient/caregiver understands and will follow disposition?: Yes       Copied from CRM #[id number]. Topic: Clinical - Red Word Triage >> Apr 23, 2024  9:28 AM Treva T wrote: [facility] that prompted transfer to Nurse Triage: Patient calling, states she is having left ear ache/pain, ringing in ear, and difficulty hearing.  Patient states used a q-tip on yesterday, and since then she has been having issues. Reason for Disposition  Ear pain right after long - thin object was inserted into the ear canal (e.g., paper clip, pencil, Q-tip, wire)  Answer Assessment - Initial Assessment Questions Pt began to having pain/ringing in ear immediately after using  a qtip, now with decreased hearing.   1. LOCATION: Which ear is involved?     Left ear pain 2. ONSET: When did the ear pain start?      About 10-15 minutes 3. SEVERITY: How bad is the pain?  (Scale 1-10; mild, moderate or severe)     More like a ringing 4. URI SYMPTOMS: Do you have a runny nose or cough?     no 5. FEVER: Do you have a fever? If Yes, ask: What is your temperature, how was it measured, and when did it start?     no 6. CAUSE: Have you been swimming recently?, How often do you use Q-TIPS?, Have you had any recent air travel or scuba diving?     Dark orange when used qtip 7. OTHER SYMPTOMS: Do you have any other symptoms? (e.g., decreased hearing, dizziness, headache, stiff neck, vomiting)     Decreased hearing  Protocols used: Earache-A-AH

## 2024-04-23 NOTE — Progress Notes (Signed)
   Subjective:    Patient ID: [name], female    DOB: 07-Jul-1946, 78 y.o.   MRN: [id number]  HPI She states that she had difficulty with hearing from her left ear and was trying to use a Q-tip to get wax out and apparently it made the situation worse.  No pain, drainage, sore throat.   Review of Systems     Objective:    Physical Exam The right tympanic membrane and canal is normal.  Left canal does show some recent trauma with slight erythema in 1 area.  Lavage was done with partial removal of some of the wax making it difficult to see the eardrum but she can hear much better.       Assessment & Plan:  Impacted cerumen of left ear Recommend that if she has difficulty in the future to either use ear lavage at home or possibly coming here and let us  do it and not use Q-tips.

## 2024-04-29 ENCOUNTER — Encounter: Payer: Self-pay | Admitting: Allergy & Immunology

## 2024-04-29 ENCOUNTER — Ambulatory Visit: Admitting: Allergy & Immunology

## 2024-04-29 VITALS — BP 114/80 | HR 72 | Temp 98.2°F | Wt 143.6 lb

## 2024-04-29 DIAGNOSIS — L501 Idiopathic urticaria: Secondary | ICD-10-CM | POA: Diagnosis not present

## 2024-04-29 DIAGNOSIS — Z91013 Allergy to seafood: Secondary | ICD-10-CM

## 2024-04-29 NOTE — Patient Instructions (Addendum)
 1. Chronic urticaria with recurrent anaphylaxis episodes - Continue with Allegra  (fexofenadine ) one tablet daily.  - I am glad that you are not having any problems without the Xolair .  2. Shellfish allergy - We will recheck the shellfish panel to see where they are intended.  - EpiPen  up to date.  3. Cerumen impaction - The left ear DEFINITELY has wax on it.  - Call us  if you want an ENT referral.   4. Return in about 1 year (around 04/29/2025). You can have the follow up appointment with Dr. Iva or a Nurse Practicioner (our Nurse Practitioners are excellent and always have Physician oversight!).    Please inform us  of any Emergency Department visits, hospitalizations, or changes in symptoms. Call us  before going to the ED for breathing or allergy symptoms since we might be able to fit you in for a sick visit. Feel free to contact us  anytime with any questions, problems, or concerns.  It was a pleasure to see you again today!  Websites that have reliable patient information: 1. American Academy of Asthma, Allergy, and Immunology: www.aaaai.org 2. Food Allergy Research and Education (FARE): foodallergy.org 3. Mothers of Asthmatics: http://www.asthmacommunitynetwork.org 4. American College of Allergy, Asthma, and Immunology: www.acaai.org      "Like" us  on Facebook and Instagram for our latest updates!      A healthy democracy works best when [employer] participate! Make sure you are registered to vote! If you have moved or changed any of your contact information, you will need to get this updated before voting! Scan the QR codes below to learn more!

## 2024-04-29 NOTE — Progress Notes (Signed)
 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 Chronic urticaria with recurrent anaphylaxis episodes - Continue with Allegra  (fexofenadine ) one tablet daily.  - I am glad that you are not having any problems without the Xolair .  2. Shellfish allergy - We will recheck the shellfish panel to see where they are intended.  - EpiPen  up to date.  3. Cerumen impaction - The left ear DEFINITELY has wax on it.  - Call us  if you want an ENT referral.   4. Return in about 1 year (around 04/29/2025). You can have the follow up appointment with Dr. Iva or a Nurse Practicioner (our Nurse Practitioners are excellent and always have Physician oversight!).    Subjective:   [name] is a 78 y.o. female presenting today for follow up of  Chief Complaint  Patient presents with   Urticaria    Pt is here for her f/u today, hives are controlled, no flare ups.    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  Discussed the use of AI scribe software for clinical note transcription with the patient and/or guardian, who gave verbal consent to proceed.  [name] is a 78 y.o. female presenting for a follow up  visit. She was last seen in April 2025. At that time, she was doing well and we continued with Allegra  one tablet daily. She was doing well despite not being on Xolair  which is excellent news for her.   Since the last visit, she has done very well.   She has not experienced any issues with hives since discontinuing Xolair  several months ago. The resolution of her symptoms is described as 'like cutting a light switch off.' She continues to avoid shellfish, which she has not consumed in many years, and has no interest in reintroducing it into her diet. She carries an EpiPen  and takes Allegra  daily to manage occasional allergies, including nasal congestion and sneezing.  Recently, she experienced ear wax buildup, which was severe enough to reach her eardrum and cause a 'roaring' sensation. She visited Dr. Joyce last week, where her ear was flushed, and significant wax was removed. However, she still feels there is wax present. She attributes the buildup to using Q-tips.  She continues to avoid shellfish. She is not interested in reintroducing this again into her diet.   She plans to attend the [place] with her daughter, who is covering the trip expenses. She enjoys activities such as visiting craft places and eating fair food.     Otherwise, there have been no changes to her past medical history, surgical history, family history, or social history.    Review of systems otherwise negative other than that mentioned in the HPI.  Objective:   Blood pressure 114/80, pulse 72, temperature 98.2 F (36.8 C), temperature source Temporal, weight 143 lb 9.6 oz (65.1 kg), SpO2 98%. Body mass index is 28.04 kg/m.    Physical Exam Vitals reviewed.  Constitutional:      Appearance: She is well-developed.     Comments: Super talkative and friendly. Appreciative. Laughing.   HENT:     Head: Normocephalic and atraumatic.     Right Ear: Tympanic membrane, ear canal and external ear normal.      Left Ear: Tympanic membrane, ear canal and external ear normal.     Nose: No nasal deformity, septal deviation, mucosal edema or rhinorrhea.     Right Turbinates: Enlarged, swollen and pale.     Left Turbinates: Enlarged, swollen and pale.     Right Sinus: No maxillary sinus tenderness or frontal sinus tenderness.     Left Sinus: No maxillary sinus tenderness or frontal sinus tenderness.     Comments: No nasal polyps.     Mouth/Throat:     Mouth: Mucous membranes are not pale and not dry.     Pharynx: Uvula midline.     Comments: Minimal white tinge to some areas in her buccal mucosa. There is a cold sore on the right side of her mouth on the inner surface. Eyes:     General: Lids are normal. No allergic shiner.       Right eye: No discharge.        Left eye: No discharge.     Conjunctiva/sclera: Conjunctivae normal.     Right eye: Right conjunctiva is not injected. No chemosis.    Left eye: Left conjunctiva is not injected. No chemosis.    Pupils: Pupils are equal, round, and reactive to light.  Cardiovascular:     Rate and Rhythm: Normal rate and regular rhythm.     Heart sounds: Normal heart sounds.  Pulmonary:     Effort: Pulmonary effort is normal. No tachypnea, accessory muscle usage or respiratory distress.     Breath sounds: Normal breath sounds. No wheezing, rhonchi or rales.     Comments: Moving air well in all lung fields. No increased work of breathing noted.  Chest:     Chest wall: No tenderness.  Lymphadenopathy:     Cervical: No cervical adenopathy.  Skin:    General: Skin is warm.     Capillary Refill: Capillary refill takes less than 2 seconds.     Coloration: Skin is not pale.     Findings: No abrasion, erythema, petechiae or rash. Rash is not papular, urticarial or vesicular.     Comments: No urticaria.  Very clear.  Neurological:     Mental Status: She is alert.  Psychiatric:        Behavior: Behavior is cooperative.      Diagnostic studies: none  (ordered shellfish testing if she was interested in getting this done)       Marty Shaggy, MD  [facility] 

## 2024-05-20 DIAGNOSIS — Z79899 Other long term (current) drug therapy: Secondary | ICD-10-CM | POA: Diagnosis not present

## 2024-05-20 DIAGNOSIS — Z961 Presence of intraocular lens: Secondary | ICD-10-CM | POA: Diagnosis not present

## 2024-05-20 DIAGNOSIS — H353131 Nonexudative age-related macular degeneration, bilateral, early dry stage: Secondary | ICD-10-CM | POA: Diagnosis not present

## 2024-06-01 ENCOUNTER — Ambulatory Visit (INDEPENDENT_AMBULATORY_CARE_PROVIDER_SITE_OTHER)

## 2024-06-01 DIAGNOSIS — Z23 Encounter for immunization: Secondary | ICD-10-CM | POA: Diagnosis not present

## 2024-06-04 ENCOUNTER — Other Ambulatory Visit: Payer: Self-pay | Admitting: Family Medicine

## 2024-06-04 DIAGNOSIS — I700 Atherosclerosis of aorta: Secondary | ICD-10-CM

## 2024-07-23 ENCOUNTER — Other Ambulatory Visit

## 2024-11-06 ENCOUNTER — Ambulatory Visit: Payer: Self-pay | Admitting: Family Medicine

## 2025-04-30 ENCOUNTER — Ambulatory Visit: Admitting: Allergy & Immunology
